# Patient Record
Sex: Male | Born: 1950 | Race: White | Hispanic: No | Smoking: Current every day smoker
Health system: Southern US, Community
[De-identification: ages and names within clinical notes are randomized; demographics above are authoritative.]

## PROBLEM LIST (undated history)

## (undated) DIAGNOSIS — F329 Major depressive disorder, single episode, unspecified: Secondary | ICD-10-CM

## (undated) DIAGNOSIS — F32A Depression, unspecified: Secondary | ICD-10-CM

## (undated) DIAGNOSIS — R590 Localized enlarged lymph nodes: Secondary | ICD-10-CM

## (undated) DIAGNOSIS — M1990 Unspecified osteoarthritis, unspecified site: Secondary | ICD-10-CM

## (undated) DIAGNOSIS — E785 Hyperlipidemia, unspecified: Secondary | ICD-10-CM

## (undated) DIAGNOSIS — I10 Essential (primary) hypertension: Secondary | ICD-10-CM

## (undated) DIAGNOSIS — J45909 Unspecified asthma, uncomplicated: Secondary | ICD-10-CM

## (undated) DIAGNOSIS — S82831A Other fracture of upper and lower end of right fibula, initial encounter for closed fracture: Secondary | ICD-10-CM

## (undated) DIAGNOSIS — D72820 Lymphocytosis (symptomatic): Secondary | ICD-10-CM

## (undated) DIAGNOSIS — E039 Hypothyroidism, unspecified: Secondary | ICD-10-CM

## (undated) HISTORY — DX: Hyperlipidemia, unspecified: E78.5

## (undated) HISTORY — DX: Essential (primary) hypertension: I10

## (undated) HISTORY — DX: Lymphocytosis (symptomatic): D72.820

## (undated) HISTORY — PX: OTHER SURGICAL HISTORY: SHX169

## (undated) HISTORY — DX: Localized enlarged lymph nodes: R59.0

## (undated) HISTORY — PX: TONSILLECTOMY: SUR1361

---

## 2003-07-03 HISTORY — PX: EYE SURGERY: SHX253

## 2007-07-18 ENCOUNTER — Emergency Department (HOSPITAL_COMMUNITY): Admission: EM | Admit: 2007-07-18 | Discharge: 2007-07-18 | Payer: Self-pay | Admitting: Family Medicine

## 2007-09-25 ENCOUNTER — Encounter: Admission: RE | Admit: 2007-09-25 | Discharge: 2007-09-25 | Payer: Self-pay | Admitting: Family Medicine

## 2008-03-19 ENCOUNTER — Encounter
Admission: RE | Admit: 2008-03-19 | Discharge: 2008-06-17 | Payer: Self-pay | Admitting: Physical Medicine & Rehabilitation

## 2008-03-21 ENCOUNTER — Emergency Department (HOSPITAL_COMMUNITY): Admission: EM | Admit: 2008-03-21 | Discharge: 2008-03-21 | Payer: Self-pay | Admitting: Family Medicine

## 2008-03-23 ENCOUNTER — Ambulatory Visit: Payer: Self-pay | Admitting: Physical Medicine & Rehabilitation

## 2008-03-27 ENCOUNTER — Emergency Department (HOSPITAL_COMMUNITY): Admission: EM | Admit: 2008-03-27 | Discharge: 2008-03-27 | Payer: Self-pay | Admitting: Emergency Medicine

## 2008-04-05 ENCOUNTER — Ambulatory Visit: Payer: Self-pay | Admitting: Physical Medicine & Rehabilitation

## 2008-04-30 ENCOUNTER — Emergency Department (HOSPITAL_COMMUNITY): Admission: EM | Admit: 2008-04-30 | Discharge: 2008-04-30 | Payer: Self-pay | Admitting: Emergency Medicine

## 2008-05-04 ENCOUNTER — Ambulatory Visit: Payer: Self-pay | Admitting: Physical Medicine & Rehabilitation

## 2008-05-17 ENCOUNTER — Ambulatory Visit: Payer: Self-pay | Admitting: Physical Medicine & Rehabilitation

## 2008-06-04 ENCOUNTER — Ambulatory Visit: Payer: Self-pay | Admitting: Physical Medicine & Rehabilitation

## 2008-06-15 ENCOUNTER — Encounter
Admission: RE | Admit: 2008-06-15 | Discharge: 2008-06-23 | Payer: Self-pay | Admitting: Physical Medicine & Rehabilitation

## 2008-07-05 ENCOUNTER — Encounter
Admission: RE | Admit: 2008-07-05 | Discharge: 2008-09-24 | Payer: Self-pay | Admitting: Physical Medicine & Rehabilitation

## 2008-07-06 ENCOUNTER — Ambulatory Visit: Payer: Self-pay | Admitting: Physical Medicine & Rehabilitation

## 2008-08-23 ENCOUNTER — Ambulatory Visit: Payer: Self-pay | Admitting: Physical Medicine & Rehabilitation

## 2008-09-24 ENCOUNTER — Encounter
Admission: RE | Admit: 2008-09-24 | Discharge: 2008-09-30 | Payer: Self-pay | Admitting: Physical Medicine & Rehabilitation

## 2008-09-30 ENCOUNTER — Ambulatory Visit: Payer: Self-pay | Admitting: Physical Medicine & Rehabilitation

## 2008-10-09 ENCOUNTER — Emergency Department (HOSPITAL_COMMUNITY): Admission: EM | Admit: 2008-10-09 | Discharge: 2008-10-10 | Payer: Self-pay | Admitting: Emergency Medicine

## 2008-10-17 ENCOUNTER — Emergency Department (HOSPITAL_COMMUNITY): Admission: EM | Admit: 2008-10-17 | Discharge: 2008-10-17 | Payer: Self-pay | Admitting: Emergency Medicine

## 2008-10-23 ENCOUNTER — Emergency Department (HOSPITAL_COMMUNITY): Admission: EM | Admit: 2008-10-23 | Discharge: 2008-10-23 | Payer: Self-pay | Admitting: Family Medicine

## 2008-11-01 ENCOUNTER — Encounter: Admission: RE | Admit: 2008-11-01 | Discharge: 2008-11-01 | Payer: Self-pay | Admitting: Specialist

## 2008-12-28 ENCOUNTER — Emergency Department (HOSPITAL_COMMUNITY): Admission: EM | Admit: 2008-12-28 | Discharge: 2008-12-28 | Payer: Self-pay | Admitting: Emergency Medicine

## 2009-01-13 ENCOUNTER — Emergency Department (HOSPITAL_COMMUNITY): Admission: EM | Admit: 2009-01-13 | Discharge: 2009-01-13 | Payer: Self-pay | Admitting: Chiropractic Medicine

## 2009-02-12 ENCOUNTER — Emergency Department (HOSPITAL_COMMUNITY): Admission: EM | Admit: 2009-02-12 | Discharge: 2009-02-13 | Payer: Self-pay | Admitting: Emergency Medicine

## 2009-03-29 ENCOUNTER — Emergency Department (HOSPITAL_COMMUNITY): Admission: EM | Admit: 2009-03-29 | Discharge: 2009-03-29 | Payer: Self-pay | Admitting: Emergency Medicine

## 2009-03-31 ENCOUNTER — Encounter: Admission: RE | Admit: 2009-03-31 | Discharge: 2009-03-31 | Payer: Self-pay | Admitting: Family Medicine

## 2009-04-11 ENCOUNTER — Emergency Department (HOSPITAL_COMMUNITY): Admission: EM | Admit: 2009-04-11 | Discharge: 2009-04-12 | Payer: Self-pay | Admitting: Emergency Medicine

## 2009-04-14 ENCOUNTER — Emergency Department (HOSPITAL_COMMUNITY): Admission: EM | Admit: 2009-04-14 | Discharge: 2009-04-14 | Payer: Self-pay | Admitting: Emergency Medicine

## 2009-04-28 ENCOUNTER — Encounter: Admission: RE | Admit: 2009-04-28 | Discharge: 2009-04-28 | Payer: Self-pay | Admitting: Specialist

## 2009-05-05 ENCOUNTER — Encounter: Admission: RE | Admit: 2009-05-05 | Discharge: 2009-05-05 | Payer: Self-pay | Admitting: Specialist

## 2009-05-28 ENCOUNTER — Emergency Department (HOSPITAL_COMMUNITY): Admission: EM | Admit: 2009-05-28 | Discharge: 2009-05-28 | Payer: Self-pay | Admitting: Emergency Medicine

## 2009-06-15 ENCOUNTER — Emergency Department (HOSPITAL_COMMUNITY): Admission: EM | Admit: 2009-06-15 | Discharge: 2009-06-15 | Payer: Self-pay | Admitting: Emergency Medicine

## 2009-06-15 ENCOUNTER — Ambulatory Visit (HOSPITAL_COMMUNITY): Admission: RE | Admit: 2009-06-15 | Discharge: 2009-06-15 | Payer: Self-pay | Admitting: Specialist

## 2009-06-17 ENCOUNTER — Encounter: Admission: RE | Admit: 2009-06-17 | Discharge: 2009-06-17 | Payer: Self-pay | Admitting: Family Medicine

## 2009-06-17 ENCOUNTER — Emergency Department (HOSPITAL_COMMUNITY): Admission: EM | Admit: 2009-06-17 | Discharge: 2009-06-17 | Payer: Self-pay | Admitting: Emergency Medicine

## 2009-06-18 ENCOUNTER — Emergency Department (HOSPITAL_COMMUNITY): Admission: EM | Admit: 2009-06-18 | Discharge: 2009-06-18 | Payer: Self-pay | Admitting: Emergency Medicine

## 2009-06-22 ENCOUNTER — Emergency Department (HOSPITAL_COMMUNITY): Admission: EM | Admit: 2009-06-22 | Discharge: 2009-06-22 | Payer: Self-pay | Admitting: Emergency Medicine

## 2009-06-23 ENCOUNTER — Emergency Department (HOSPITAL_COMMUNITY): Admission: EM | Admit: 2009-06-23 | Discharge: 2009-06-23 | Payer: Self-pay | Admitting: Emergency Medicine

## 2009-06-27 ENCOUNTER — Emergency Department (HOSPITAL_COMMUNITY): Admission: EM | Admit: 2009-06-27 | Discharge: 2009-06-28 | Payer: Self-pay | Admitting: Emergency Medicine

## 2009-08-15 ENCOUNTER — Emergency Department (HOSPITAL_COMMUNITY): Admission: EM | Admit: 2009-08-15 | Discharge: 2009-08-16 | Payer: Self-pay | Admitting: Emergency Medicine

## 2009-08-26 ENCOUNTER — Emergency Department (HOSPITAL_COMMUNITY): Admission: EM | Admit: 2009-08-26 | Discharge: 2009-08-26 | Payer: Self-pay | Admitting: Emergency Medicine

## 2009-08-29 ENCOUNTER — Emergency Department (HOSPITAL_COMMUNITY): Admission: EM | Admit: 2009-08-29 | Discharge: 2009-08-29 | Payer: Self-pay | Admitting: Internal Medicine

## 2009-08-30 HISTORY — PX: CARPAL TUNNEL RELEASE: SHX101

## 2009-09-18 ENCOUNTER — Emergency Department (HOSPITAL_COMMUNITY): Admission: EM | Admit: 2009-09-18 | Discharge: 2009-09-18 | Payer: Self-pay | Admitting: Emergency Medicine

## 2009-09-28 ENCOUNTER — Emergency Department (HOSPITAL_COMMUNITY): Admission: EM | Admit: 2009-09-28 | Discharge: 2009-09-28 | Payer: Self-pay | Admitting: Emergency Medicine

## 2009-09-29 ENCOUNTER — Emergency Department (HOSPITAL_COMMUNITY): Admission: EM | Admit: 2009-09-29 | Discharge: 2009-09-30 | Payer: Self-pay | Admitting: Emergency Medicine

## 2009-10-17 ENCOUNTER — Emergency Department (HOSPITAL_COMMUNITY): Admission: EM | Admit: 2009-10-17 | Discharge: 2009-10-18 | Payer: Self-pay | Admitting: Emergency Medicine

## 2010-04-02 ENCOUNTER — Emergency Department (HOSPITAL_COMMUNITY): Admission: EM | Admit: 2010-04-02 | Discharge: 2010-04-02 | Payer: Self-pay | Admitting: Emergency Medicine

## 2010-05-28 ENCOUNTER — Emergency Department (HOSPITAL_COMMUNITY): Admission: EM | Admit: 2010-05-28 | Discharge: 2010-05-28 | Payer: Self-pay | Admitting: Emergency Medicine

## 2010-06-07 ENCOUNTER — Encounter
Admission: RE | Admit: 2010-06-07 | Discharge: 2010-06-07 | Payer: Self-pay | Source: Home / Self Care | Admitting: Family Medicine

## 2010-06-08 ENCOUNTER — Emergency Department (HOSPITAL_COMMUNITY): Admission: EM | Admit: 2010-06-08 | Discharge: 2009-09-03 | Payer: Self-pay | Admitting: Emergency Medicine

## 2010-06-09 ENCOUNTER — Encounter
Admission: RE | Admit: 2010-06-09 | Discharge: 2010-06-09 | Payer: Self-pay | Source: Home / Self Care | Attending: Family Medicine | Admitting: Family Medicine

## 2010-06-10 ENCOUNTER — Emergency Department (HOSPITAL_COMMUNITY)
Admission: EM | Admit: 2010-06-10 | Discharge: 2010-06-10 | Payer: Self-pay | Source: Home / Self Care | Admitting: Emergency Medicine

## 2010-06-13 ENCOUNTER — Emergency Department (HOSPITAL_COMMUNITY)
Admission: EM | Admit: 2010-06-13 | Discharge: 2010-06-13 | Payer: Self-pay | Source: Home / Self Care | Admitting: Emergency Medicine

## 2010-06-23 ENCOUNTER — Ambulatory Visit: Payer: Self-pay | Admitting: Hematology & Oncology

## 2010-06-24 ENCOUNTER — Emergency Department (HOSPITAL_COMMUNITY)
Admission: EM | Admit: 2010-06-24 | Discharge: 2010-06-24 | Payer: Self-pay | Source: Home / Self Care | Admitting: Emergency Medicine

## 2010-07-13 LAB — CBC WITH DIFFERENTIAL (CANCER CENTER ONLY)
BASO#: 0.2 10*3/uL (ref 0.0–0.2)
BASO%: 0.9 % (ref 0.0–2.0)
EOS%: 2.3 % (ref 0.0–7.0)
Eosinophils Absolute: 0.4 10*3/uL (ref 0.0–0.5)
HCT: 43 % (ref 38.7–49.9)
HGB: 14.6 g/dL (ref 13.0–17.1)
LYMPH#: 2 10*3/uL (ref 0.9–3.3)
LYMPH%: 11.9 % — ABNORMAL LOW (ref 14.0–48.0)
MCH: 30.3 pg (ref 28.0–33.4)
MCHC: 34 g/dL (ref 32.0–35.9)
MCV: 89 fL (ref 82–98)
MONO#: 1.1 10*3/uL — ABNORMAL HIGH (ref 0.1–0.9)
MONO%: 6.2 % (ref 0.0–13.0)
NEUT#: 13.3 10*3/uL — ABNORMAL HIGH (ref 1.5–6.5)
NEUT%: 78.7 % (ref 40.0–80.0)
Platelets: 277 10*3/uL (ref 145–400)
RBC: 4.82 10*6/uL (ref 4.20–5.70)
RDW: 11.5 % (ref 10.5–14.6)
WBC: 16.9 10*3/uL — ABNORMAL HIGH (ref 4.0–10.0)

## 2010-07-13 LAB — TECHNOLOGIST REVIEW CHCC SATELLITE

## 2010-07-13 LAB — CHCC SATELLITE - SMEAR

## 2010-07-14 LAB — COMPREHENSIVE METABOLIC PANEL
ALT: 13 U/L (ref 0–53)
AST: 16 U/L (ref 0–37)
Albumin: 3.9 g/dL (ref 3.5–5.2)
Alkaline Phosphatase: 71 U/L (ref 39–117)
BUN: 13 mg/dL (ref 6–23)
CO2: 23 mEq/L (ref 19–32)
Calcium: 9 mg/dL (ref 8.4–10.5)
Chloride: 99 mEq/L (ref 96–112)
Creatinine, Ser: 0.9 mg/dL (ref 0.40–1.50)
Glucose, Bld: 260 mg/dL — ABNORMAL HIGH (ref 70–99)
Potassium: 3.6 mEq/L (ref 3.5–5.3)
Sodium: 136 mEq/L (ref 135–145)
Total Bilirubin: 0.8 mg/dL (ref 0.3–1.2)
Total Protein: 6.7 g/dL (ref 6.0–8.3)

## 2010-07-14 LAB — ANGIOTENSIN CONVERTING ENZYME: Angiotensin 1 CE: 43 U/L (ref 8–52)

## 2010-07-14 LAB — LACTATE DEHYDROGENASE: LDH: 387 U/L — ABNORMAL HIGH (ref 94–250)

## 2010-07-16 ENCOUNTER — Emergency Department (HOSPITAL_COMMUNITY)
Admission: EM | Admit: 2010-07-16 | Discharge: 2010-07-17 | Payer: Self-pay | Source: Home / Self Care | Admitting: Emergency Medicine

## 2010-07-17 LAB — RAPID STREP SCREEN (MED CTR MEBANE ONLY): Streptococcus, Group A Screen (Direct): NEGATIVE

## 2010-07-19 LAB — STREP A DNA PROBE: Group A Strep Probe: NEGATIVE

## 2010-07-20 ENCOUNTER — Other Ambulatory Visit: Payer: Self-pay | Admitting: Hematology & Oncology

## 2010-07-20 DIAGNOSIS — R591 Generalized enlarged lymph nodes: Secondary | ICD-10-CM

## 2010-07-21 ENCOUNTER — Emergency Department (HOSPITAL_COMMUNITY)
Admission: EM | Admit: 2010-07-21 | Discharge: 2010-07-21 | Payer: Self-pay | Source: Home / Self Care | Admitting: Emergency Medicine

## 2010-07-23 ENCOUNTER — Encounter: Payer: Self-pay | Admitting: Family Medicine

## 2010-07-23 ENCOUNTER — Encounter: Payer: Self-pay | Admitting: Specialist

## 2010-07-26 LAB — CULTURE, ROUTINE-ABSCESS

## 2010-08-01 ENCOUNTER — Emergency Department (HOSPITAL_COMMUNITY)
Admission: EM | Admit: 2010-08-01 | Discharge: 2010-08-01 | Payer: Self-pay | Source: Home / Self Care | Admitting: Emergency Medicine

## 2010-08-20 ENCOUNTER — Emergency Department (HOSPITAL_COMMUNITY)
Admission: EM | Admit: 2010-08-20 | Discharge: 2010-08-20 | Disposition: A | Discharge status: Home / Self Care | Payer: Medicare Other | Attending: Emergency Medicine | Admitting: Emergency Medicine

## 2010-08-20 DIAGNOSIS — G8929 Other chronic pain: Secondary | ICD-10-CM | POA: Insufficient documentation

## 2010-08-20 DIAGNOSIS — IMO0001 Reserved for inherently not codable concepts without codable children: Secondary | ICD-10-CM | POA: Insufficient documentation

## 2010-08-20 DIAGNOSIS — M549 Dorsalgia, unspecified: Secondary | ICD-10-CM | POA: Insufficient documentation

## 2010-08-20 DIAGNOSIS — Z794 Long term (current) use of insulin: Secondary | ICD-10-CM | POA: Insufficient documentation

## 2010-08-20 DIAGNOSIS — L03317 Cellulitis of buttock: Secondary | ICD-10-CM | POA: Insufficient documentation

## 2010-08-20 DIAGNOSIS — R000 Tachycardia, unspecified: Secondary | ICD-10-CM | POA: Insufficient documentation

## 2010-08-20 DIAGNOSIS — L0231 Cutaneous abscess of buttock: Secondary | ICD-10-CM | POA: Insufficient documentation

## 2010-08-20 DIAGNOSIS — E785 Hyperlipidemia, unspecified: Secondary | ICD-10-CM | POA: Insufficient documentation

## 2010-08-20 DIAGNOSIS — Z79899 Other long term (current) drug therapy: Secondary | ICD-10-CM | POA: Insufficient documentation

## 2010-08-20 DIAGNOSIS — I10 Essential (primary) hypertension: Secondary | ICD-10-CM | POA: Insufficient documentation

## 2010-08-20 DIAGNOSIS — E119 Type 2 diabetes mellitus without complications: Secondary | ICD-10-CM | POA: Insufficient documentation

## 2010-08-20 DIAGNOSIS — M25559 Pain in unspecified hip: Secondary | ICD-10-CM | POA: Insufficient documentation

## 2010-08-24 ENCOUNTER — Ambulatory Visit (HOSPITAL_BASED_OUTPATIENT_CLINIC_OR_DEPARTMENT_OTHER)
Admission: RE | Admit: 2010-08-24 | Discharge: 2010-08-24 | Disposition: A | Discharge status: Home / Self Care | Payer: Medicare Other | Source: Ambulatory Visit | Attending: Hematology & Oncology | Admitting: Hematology & Oncology

## 2010-08-24 ENCOUNTER — Emergency Department (HOSPITAL_BASED_OUTPATIENT_CLINIC_OR_DEPARTMENT_OTHER)
Admission: EM | Admit: 2010-08-24 | Discharge: 2010-08-24 | Disposition: A | Discharge status: Home / Self Care | Payer: Medicare Other | Source: Home / Self Care | Attending: Emergency Medicine | Admitting: Emergency Medicine

## 2010-08-24 DIAGNOSIS — E785 Hyperlipidemia, unspecified: Secondary | ICD-10-CM | POA: Insufficient documentation

## 2010-08-24 DIAGNOSIS — L03119 Cellulitis of unspecified part of limb: Secondary | ICD-10-CM | POA: Insufficient documentation

## 2010-08-24 DIAGNOSIS — Z794 Long term (current) use of insulin: Secondary | ICD-10-CM | POA: Insufficient documentation

## 2010-08-24 DIAGNOSIS — I10 Essential (primary) hypertension: Secondary | ICD-10-CM | POA: Insufficient documentation

## 2010-08-24 DIAGNOSIS — L02419 Cutaneous abscess of limb, unspecified: Secondary | ICD-10-CM | POA: Insufficient documentation

## 2010-08-24 DIAGNOSIS — B356 Tinea cruris: Secondary | ICD-10-CM | POA: Insufficient documentation

## 2010-08-24 DIAGNOSIS — F17200 Nicotine dependence, unspecified, uncomplicated: Secondary | ICD-10-CM

## 2010-08-24 DIAGNOSIS — R229 Localized swelling, mass and lump, unspecified: Secondary | ICD-10-CM | POA: Insufficient documentation

## 2010-08-24 DIAGNOSIS — R599 Enlarged lymph nodes, unspecified: Secondary | ICD-10-CM | POA: Insufficient documentation

## 2010-08-24 DIAGNOSIS — R591 Generalized enlarged lymph nodes: Secondary | ICD-10-CM

## 2010-08-24 DIAGNOSIS — R21 Rash and other nonspecific skin eruption: Secondary | ICD-10-CM | POA: Insufficient documentation

## 2010-08-24 DIAGNOSIS — E119 Type 2 diabetes mellitus without complications: Secondary | ICD-10-CM | POA: Insufficient documentation

## 2010-08-24 MED ORDER — IOHEXOL 300 MG/ML  SOLN
100.0000 mL | Freq: Once | INTRAMUSCULAR | Status: AC | PRN
  Administered 2010-08-24: 80 mL via INTRAVENOUS

## 2010-08-25 ENCOUNTER — Emergency Department (HOSPITAL_COMMUNITY)
Admission: EM | Admit: 2010-08-25 | Discharge: 2010-08-25 | Disposition: A | Discharge status: Home / Self Care | Payer: Medicare Other | Attending: Emergency Medicine | Admitting: Emergency Medicine

## 2010-08-25 DIAGNOSIS — I10 Essential (primary) hypertension: Secondary | ICD-10-CM | POA: Insufficient documentation

## 2010-08-25 DIAGNOSIS — E119 Type 2 diabetes mellitus without complications: Secondary | ICD-10-CM | POA: Insufficient documentation

## 2010-08-25 DIAGNOSIS — L02419 Cutaneous abscess of limb, unspecified: Secondary | ICD-10-CM | POA: Insufficient documentation

## 2010-08-25 DIAGNOSIS — F411 Generalized anxiety disorder: Secondary | ICD-10-CM | POA: Insufficient documentation

## 2010-08-25 DIAGNOSIS — M25559 Pain in unspecified hip: Secondary | ICD-10-CM | POA: Insufficient documentation

## 2010-08-25 DIAGNOSIS — E785 Hyperlipidemia, unspecified: Secondary | ICD-10-CM | POA: Insufficient documentation

## 2010-08-25 DIAGNOSIS — L03317 Cellulitis of buttock: Secondary | ICD-10-CM | POA: Insufficient documentation

## 2010-08-25 DIAGNOSIS — L03119 Cellulitis of unspecified part of limb: Secondary | ICD-10-CM | POA: Insufficient documentation

## 2010-08-25 DIAGNOSIS — L0231 Cutaneous abscess of buttock: Secondary | ICD-10-CM | POA: Insufficient documentation

## 2010-09-04 ENCOUNTER — Encounter (HOSPITAL_BASED_OUTPATIENT_CLINIC_OR_DEPARTMENT_OTHER): Payer: Medicare Other | Admitting: Hematology & Oncology

## 2010-09-04 ENCOUNTER — Other Ambulatory Visit: Payer: Self-pay | Admitting: Hematology & Oncology

## 2010-09-04 DIAGNOSIS — C383 Malignant neoplasm of mediastinum, part unspecified: Secondary | ICD-10-CM

## 2010-09-04 DIAGNOSIS — R05 Cough: Secondary | ICD-10-CM

## 2010-09-04 DIAGNOSIS — D72829 Elevated white blood cell count, unspecified: Secondary | ICD-10-CM

## 2010-09-04 DIAGNOSIS — F17200 Nicotine dependence, unspecified, uncomplicated: Secondary | ICD-10-CM

## 2010-09-04 DIAGNOSIS — D72820 Lymphocytosis (symptomatic): Secondary | ICD-10-CM

## 2010-09-04 DIAGNOSIS — R599 Enlarged lymph nodes, unspecified: Secondary | ICD-10-CM

## 2010-09-04 LAB — CBC WITH DIFFERENTIAL (CANCER CENTER ONLY)
Eosinophils Absolute: 0.4 10*3/uL (ref 0.0–0.5)
HCT: 42.9 % (ref 38.7–49.9)
LYMPH%: 19.5 % (ref 14.0–48.0)
MCV: 85 fL (ref 82–98)
MONO#: 0.8 10*3/uL (ref 0.1–0.9)
NEUT%: 70.1 % (ref 40.0–80.0)
RBC: 5.07 10*6/uL (ref 4.20–5.70)
WBC: 12.4 10*3/uL — ABNORMAL HIGH (ref 4.0–10.0)

## 2010-09-09 ENCOUNTER — Emergency Department (HOSPITAL_COMMUNITY)
Admission: EM | Admit: 2010-09-09 | Discharge: 2010-09-09 | Disposition: A | Discharge status: Home / Self Care | Payer: Medicare Other | Attending: Emergency Medicine | Admitting: Emergency Medicine

## 2010-09-09 DIAGNOSIS — Z794 Long term (current) use of insulin: Secondary | ICD-10-CM | POA: Insufficient documentation

## 2010-09-09 DIAGNOSIS — L0231 Cutaneous abscess of buttock: Secondary | ICD-10-CM | POA: Insufficient documentation

## 2010-09-09 DIAGNOSIS — E785 Hyperlipidemia, unspecified: Secondary | ICD-10-CM | POA: Insufficient documentation

## 2010-09-09 DIAGNOSIS — E119 Type 2 diabetes mellitus without complications: Secondary | ICD-10-CM | POA: Insufficient documentation

## 2010-09-09 DIAGNOSIS — I10 Essential (primary) hypertension: Secondary | ICD-10-CM | POA: Insufficient documentation

## 2010-09-11 ENCOUNTER — Emergency Department (HOSPITAL_COMMUNITY)
Admission: EM | Admit: 2010-09-11 | Discharge: 2010-09-11 | Disposition: A | Discharge status: Home / Self Care | Payer: Medicare Other | Attending: Emergency Medicine | Admitting: Emergency Medicine

## 2010-09-11 ENCOUNTER — Emergency Department (HOSPITAL_COMMUNITY): Payer: Medicare Other

## 2010-09-11 DIAGNOSIS — S8990XA Unspecified injury of unspecified lower leg, initial encounter: Secondary | ICD-10-CM | POA: Insufficient documentation

## 2010-09-11 DIAGNOSIS — M549 Dorsalgia, unspecified: Secondary | ICD-10-CM | POA: Insufficient documentation

## 2010-09-11 DIAGNOSIS — M545 Low back pain, unspecified: Secondary | ICD-10-CM | POA: Insufficient documentation

## 2010-09-11 DIAGNOSIS — E119 Type 2 diabetes mellitus without complications: Secondary | ICD-10-CM | POA: Insufficient documentation

## 2010-09-11 DIAGNOSIS — I10 Essential (primary) hypertension: Secondary | ICD-10-CM | POA: Insufficient documentation

## 2010-09-11 DIAGNOSIS — Z794 Long term (current) use of insulin: Secondary | ICD-10-CM | POA: Insufficient documentation

## 2010-09-11 DIAGNOSIS — Y92009 Unspecified place in unspecified non-institutional (private) residence as the place of occurrence of the external cause: Secondary | ICD-10-CM | POA: Insufficient documentation

## 2010-09-11 DIAGNOSIS — M25539 Pain in unspecified wrist: Secondary | ICD-10-CM | POA: Insufficient documentation

## 2010-09-11 DIAGNOSIS — M533 Sacrococcygeal disorders, not elsewhere classified: Secondary | ICD-10-CM | POA: Insufficient documentation

## 2010-09-11 DIAGNOSIS — S63509A Unspecified sprain of unspecified wrist, initial encounter: Secondary | ICD-10-CM | POA: Insufficient documentation

## 2010-09-11 DIAGNOSIS — E785 Hyperlipidemia, unspecified: Secondary | ICD-10-CM | POA: Insufficient documentation

## 2010-09-11 DIAGNOSIS — Z79899 Other long term (current) drug therapy: Secondary | ICD-10-CM | POA: Insufficient documentation

## 2010-09-11 DIAGNOSIS — W19XXXA Unspecified fall, initial encounter: Secondary | ICD-10-CM | POA: Insufficient documentation

## 2010-09-14 LAB — DIFFERENTIAL
Basophils Absolute: 0.1 10*3/uL (ref 0.0–0.1)
Basophils Relative: 1 % (ref 0–1)
Neutro Abs: 10 10*3/uL — ABNORMAL HIGH (ref 1.7–7.7)
Neutrophils Relative %: 73 % (ref 43–77)

## 2010-09-14 LAB — CBC
Hemoglobin: 13.9 g/dL (ref 13.0–17.0)
MCHC: 33.7 g/dL (ref 30.0–36.0)
Platelets: 205 10*3/uL (ref 150–400)
RDW: 13.7 % (ref 11.5–15.5)

## 2010-09-14 LAB — POCT I-STAT, CHEM 8
Chloride: 106 mEq/L (ref 96–112)
HCT: 41 % (ref 39.0–52.0)
Hemoglobin: 13.9 g/dL (ref 13.0–17.0)
Potassium: 3.7 mEq/L (ref 3.5–5.1)
Sodium: 140 mEq/L (ref 135–145)

## 2010-09-14 LAB — GLUCOSE, CAPILLARY: Glucose-Capillary: 252 mg/dL — ABNORMAL HIGH (ref 70–99)

## 2010-09-14 LAB — POCT CARDIAC MARKERS
CKMB, poc: 2.8 ng/mL (ref 1.0–8.0)
Troponin i, poc: <0.05 ng/mL (ref 0.00–0.09)

## 2010-09-16 ENCOUNTER — Emergency Department (HOSPITAL_COMMUNITY): Payer: Medicare Other

## 2010-09-16 ENCOUNTER — Emergency Department (HOSPITAL_COMMUNITY)
Admission: EM | Admit: 2010-09-16 | Discharge: 2010-09-16 | Disposition: A | Discharge status: Home / Self Care | Payer: Medicare Other | Attending: Emergency Medicine | Admitting: Emergency Medicine

## 2010-09-16 DIAGNOSIS — I10 Essential (primary) hypertension: Secondary | ICD-10-CM | POA: Insufficient documentation

## 2010-09-16 DIAGNOSIS — Z794 Long term (current) use of insulin: Secondary | ICD-10-CM | POA: Insufficient documentation

## 2010-09-16 DIAGNOSIS — IMO0002 Reserved for concepts with insufficient information to code with codable children: Secondary | ICD-10-CM | POA: Insufficient documentation

## 2010-09-16 DIAGNOSIS — G8929 Other chronic pain: Secondary | ICD-10-CM | POA: Insufficient documentation

## 2010-09-16 DIAGNOSIS — R630 Anorexia: Secondary | ICD-10-CM | POA: Insufficient documentation

## 2010-09-16 DIAGNOSIS — F410 Panic disorder [episodic paroxysmal anxiety] without agoraphobia: Secondary | ICD-10-CM | POA: Insufficient documentation

## 2010-09-16 DIAGNOSIS — R634 Abnormal weight loss: Secondary | ICD-10-CM | POA: Insufficient documentation

## 2010-09-16 DIAGNOSIS — M549 Dorsalgia, unspecified: Secondary | ICD-10-CM | POA: Insufficient documentation

## 2010-09-16 DIAGNOSIS — E785 Hyperlipidemia, unspecified: Secondary | ICD-10-CM | POA: Insufficient documentation

## 2010-09-16 DIAGNOSIS — E119 Type 2 diabetes mellitus without complications: Secondary | ICD-10-CM | POA: Insufficient documentation

## 2010-09-16 DIAGNOSIS — Z79899 Other long term (current) drug therapy: Secondary | ICD-10-CM | POA: Insufficient documentation

## 2010-09-16 DIAGNOSIS — R51 Headache: Secondary | ICD-10-CM | POA: Insufficient documentation

## 2010-09-16 DIAGNOSIS — W19XXXA Unspecified fall, initial encounter: Secondary | ICD-10-CM | POA: Insufficient documentation

## 2010-09-16 DIAGNOSIS — F411 Generalized anxiety disorder: Secondary | ICD-10-CM | POA: Insufficient documentation

## 2010-09-16 DIAGNOSIS — R079 Chest pain, unspecified: Secondary | ICD-10-CM | POA: Insufficient documentation

## 2010-09-16 LAB — DIFFERENTIAL
Basophils Absolute: 0 10*3/uL (ref 0.0–0.1)
Basophils Relative: 0 % (ref 0–1)
Myelocytes: 0 %
Neutro Abs: 11.4 10*3/uL — ABNORMAL HIGH (ref 1.7–7.7)
Neutrophils Relative %: 73 % (ref 43–77)
Promyelocytes Absolute: 0 %

## 2010-09-16 LAB — CBC
Hemoglobin: 15.9 g/dL (ref 13.0–17.0)
MCH: 30.3 pg (ref 26.0–34.0)
RBC: 5.25 MIL/uL (ref 4.22–5.81)

## 2010-09-16 LAB — POCT CARDIAC MARKERS
CKMB, poc: 2.1 ng/mL (ref 1.0–8.0)
Myoglobin, poc: 112 ng/mL (ref 12–200)
Troponin i, poc: <0.05 ng/mL (ref 0.00–0.09)

## 2010-09-16 LAB — POCT I-STAT, CHEM 8
BUN: 12 mg/dL (ref 6–23)
Calcium, Ion: 1.13 mmol/L (ref 1.12–1.32)
Hemoglobin: 17.3 g/dL — ABNORMAL HIGH (ref 13.0–17.0)
TCO2: 28 mmol/L (ref 0–100)

## 2010-09-20 ENCOUNTER — Emergency Department (HOSPITAL_COMMUNITY)
Admission: EM | Admit: 2010-09-20 | Discharge: 2010-09-20 | Disposition: A | Discharge status: Home / Self Care | Payer: Medicare Other | Attending: Emergency Medicine | Admitting: Emergency Medicine

## 2010-09-20 ENCOUNTER — Emergency Department (HOSPITAL_COMMUNITY)
Admission: EM | Admit: 2010-09-20 | Discharge: 2010-09-20 | Discharge status: Against Medical Advice | Payer: Medicare Other | Attending: Emergency Medicine | Admitting: Emergency Medicine

## 2010-09-20 ENCOUNTER — Encounter (HOSPITAL_COMMUNITY)
Admission: RE | Admit: 2010-09-20 | Discharge: 2010-09-20 | Disposition: A | Discharge status: Home / Self Care | Payer: Medicare Other | Source: Ambulatory Visit | Attending: Hematology & Oncology | Admitting: Hematology & Oncology

## 2010-09-20 DIAGNOSIS — C383 Malignant neoplasm of mediastinum, part unspecified: Secondary | ICD-10-CM

## 2010-09-20 DIAGNOSIS — I10 Essential (primary) hypertension: Secondary | ICD-10-CM | POA: Insufficient documentation

## 2010-09-20 DIAGNOSIS — L03119 Cellulitis of unspecified part of limb: Secondary | ICD-10-CM | POA: Insufficient documentation

## 2010-09-20 DIAGNOSIS — L03319 Cellulitis of trunk, unspecified: Secondary | ICD-10-CM | POA: Insufficient documentation

## 2010-09-20 DIAGNOSIS — Z794 Long term (current) use of insulin: Secondary | ICD-10-CM | POA: Insufficient documentation

## 2010-09-20 DIAGNOSIS — E119 Type 2 diabetes mellitus without complications: Secondary | ICD-10-CM | POA: Insufficient documentation

## 2010-09-20 DIAGNOSIS — E785 Hyperlipidemia, unspecified: Secondary | ICD-10-CM | POA: Insufficient documentation

## 2010-09-20 DIAGNOSIS — L02419 Cutaneous abscess of limb, unspecified: Secondary | ICD-10-CM | POA: Insufficient documentation

## 2010-09-20 DIAGNOSIS — L02219 Cutaneous abscess of trunk, unspecified: Secondary | ICD-10-CM | POA: Insufficient documentation

## 2010-09-20 LAB — GLUCOSE, CAPILLARY: Glucose-Capillary: 190 mg/dL — ABNORMAL HIGH (ref 70–99)

## 2010-09-22 ENCOUNTER — Emergency Department (HOSPITAL_COMMUNITY)
Admission: EM | Admit: 2010-09-22 | Discharge: 2010-09-22 | Disposition: A | Discharge status: Home / Self Care | Payer: Medicare Other | Attending: Emergency Medicine | Admitting: Emergency Medicine

## 2010-09-22 DIAGNOSIS — Z794 Long term (current) use of insulin: Secondary | ICD-10-CM | POA: Insufficient documentation

## 2010-09-22 DIAGNOSIS — L03319 Cellulitis of trunk, unspecified: Secondary | ICD-10-CM | POA: Insufficient documentation

## 2010-09-22 DIAGNOSIS — G8929 Other chronic pain: Secondary | ICD-10-CM | POA: Insufficient documentation

## 2010-09-22 DIAGNOSIS — E785 Hyperlipidemia, unspecified: Secondary | ICD-10-CM | POA: Insufficient documentation

## 2010-09-22 DIAGNOSIS — I10 Essential (primary) hypertension: Secondary | ICD-10-CM | POA: Insufficient documentation

## 2010-09-22 DIAGNOSIS — M549 Dorsalgia, unspecified: Secondary | ICD-10-CM | POA: Insufficient documentation

## 2010-09-22 DIAGNOSIS — E119 Type 2 diabetes mellitus without complications: Secondary | ICD-10-CM | POA: Insufficient documentation

## 2010-09-22 DIAGNOSIS — Z79899 Other long term (current) drug therapy: Secondary | ICD-10-CM | POA: Insufficient documentation

## 2010-09-22 DIAGNOSIS — L02219 Cutaneous abscess of trunk, unspecified: Secondary | ICD-10-CM | POA: Insufficient documentation

## 2010-09-23 ENCOUNTER — Emergency Department (HOSPITAL_COMMUNITY): Payer: Medicare Other

## 2010-09-23 ENCOUNTER — Emergency Department (HOSPITAL_COMMUNITY)
Admission: EM | Admit: 2010-09-23 | Discharge: 2010-09-23 | Disposition: A | Discharge status: Home / Self Care | Payer: Medicare Other | Attending: Emergency Medicine | Admitting: Emergency Medicine

## 2010-09-23 DIAGNOSIS — R269 Unspecified abnormalities of gait and mobility: Secondary | ICD-10-CM | POA: Insufficient documentation

## 2010-09-23 DIAGNOSIS — Z794 Long term (current) use of insulin: Secondary | ICD-10-CM | POA: Insufficient documentation

## 2010-09-23 DIAGNOSIS — I10 Essential (primary) hypertension: Secondary | ICD-10-CM | POA: Insufficient documentation

## 2010-09-23 DIAGNOSIS — M2390 Unspecified internal derangement of unspecified knee: Secondary | ICD-10-CM | POA: Insufficient documentation

## 2010-09-23 DIAGNOSIS — X500XXA Overexertion from strenuous movement or load, initial encounter: Secondary | ICD-10-CM | POA: Insufficient documentation

## 2010-09-23 DIAGNOSIS — M2540 Effusion, unspecified joint: Secondary | ICD-10-CM | POA: Insufficient documentation

## 2010-09-23 DIAGNOSIS — E119 Type 2 diabetes mellitus without complications: Secondary | ICD-10-CM | POA: Insufficient documentation

## 2010-09-25 LAB — WOUND CULTURE

## 2010-09-26 ENCOUNTER — Encounter (HOSPITAL_COMMUNITY)
Admission: RE | Admit: 2010-09-26 | Discharge: 2010-09-26 | Disposition: A | Discharge status: Home / Self Care | Payer: Medicare Other | Source: Ambulatory Visit | Attending: Hematology & Oncology | Admitting: Hematology & Oncology

## 2010-09-26 DIAGNOSIS — C383 Malignant neoplasm of mediastinum, part unspecified: Secondary | ICD-10-CM | POA: Insufficient documentation

## 2010-09-27 ENCOUNTER — Ambulatory Visit
Admission: RE | Admit: 2010-09-27 | Discharge: 2010-09-27 | Disposition: A | Discharge status: Home / Self Care | Payer: Medicare Other | Source: Ambulatory Visit | Attending: Orthopedic Surgery | Admitting: Orthopedic Surgery

## 2010-09-27 ENCOUNTER — Other Ambulatory Visit: Payer: Self-pay | Admitting: Orthopedic Surgery

## 2010-09-27 DIAGNOSIS — M25561 Pain in right knee: Secondary | ICD-10-CM

## 2010-09-29 ENCOUNTER — Other Ambulatory Visit (HOSPITAL_COMMUNITY): Payer: Medicare Other

## 2010-10-01 ENCOUNTER — Emergency Department (HOSPITAL_COMMUNITY)
Admission: EM | Admit: 2010-10-01 | Discharge: 2010-10-02 | Disposition: A | Discharge status: Home / Self Care | Payer: Medicare Other | Attending: Emergency Medicine | Admitting: Emergency Medicine

## 2010-10-01 DIAGNOSIS — E119 Type 2 diabetes mellitus without complications: Secondary | ICD-10-CM | POA: Insufficient documentation

## 2010-10-01 DIAGNOSIS — Z79899 Other long term (current) drug therapy: Secondary | ICD-10-CM | POA: Insufficient documentation

## 2010-10-01 DIAGNOSIS — Z794 Long term (current) use of insulin: Secondary | ICD-10-CM | POA: Insufficient documentation

## 2010-10-01 DIAGNOSIS — E785 Hyperlipidemia, unspecified: Secondary | ICD-10-CM | POA: Insufficient documentation

## 2010-10-01 DIAGNOSIS — L02419 Cutaneous abscess of limb, unspecified: Secondary | ICD-10-CM | POA: Insufficient documentation

## 2010-10-01 DIAGNOSIS — M79609 Pain in unspecified limb: Secondary | ICD-10-CM | POA: Insufficient documentation

## 2010-10-01 DIAGNOSIS — L708 Other acne: Secondary | ICD-10-CM | POA: Insufficient documentation

## 2010-10-01 DIAGNOSIS — G8929 Other chronic pain: Secondary | ICD-10-CM | POA: Insufficient documentation

## 2010-10-01 DIAGNOSIS — M549 Dorsalgia, unspecified: Secondary | ICD-10-CM | POA: Insufficient documentation

## 2010-10-01 DIAGNOSIS — F411 Generalized anxiety disorder: Secondary | ICD-10-CM | POA: Insufficient documentation

## 2010-10-01 DIAGNOSIS — I10 Essential (primary) hypertension: Secondary | ICD-10-CM | POA: Insufficient documentation

## 2010-10-02 ENCOUNTER — Encounter (HOSPITAL_COMMUNITY): Admission: RE | Admit: 2010-10-02 | Payer: Medicare Other | Source: Ambulatory Visit

## 2010-10-03 LAB — DIFFERENTIAL
Basophils Absolute: 0.1 10*3/uL (ref 0.0–0.1)
Lymphocytes Relative: 13 % (ref 12–46)
Monocytes Absolute: 1 10*3/uL (ref 0.1–1.0)
Neutro Abs: 13 10*3/uL — ABNORMAL HIGH (ref 1.7–7.7)
Neutrophils Relative %: 79 % — ABNORMAL HIGH (ref 43–77)

## 2010-10-03 LAB — PROTIME-INR
INR: 0.98 (ref 0.00–1.49)
Prothrombin Time: 12.9 seconds (ref 11.6–15.2)

## 2010-10-03 LAB — CBC
HCT: 41.9 % (ref 39.0–52.0)
Hemoglobin: 14.7 g/dL (ref 13.0–17.0)
MCV: 88.3 fL (ref 78.0–100.0)
Platelets: 201 10*3/uL (ref 150–400)
RDW: 15.4 % (ref 11.5–15.5)
WBC: 16.5 10*3/uL — ABNORMAL HIGH (ref 4.0–10.5)

## 2010-10-03 LAB — COMPREHENSIVE METABOLIC PANEL
Albumin: 3.6 g/dL (ref 3.5–5.2)
BUN: 13 mg/dL (ref 6–23)
Chloride: 105 mEq/L (ref 96–112)
Creatinine, Ser: 0.9 mg/dL (ref 0.4–1.5)
Glucose, Bld: 136 mg/dL — ABNORMAL HIGH (ref 70–99)
Total Bilirubin: 0.6 mg/dL (ref 0.3–1.2)

## 2010-10-05 ENCOUNTER — Encounter (HOSPITAL_COMMUNITY): Admission: RE | Admit: 2010-10-05 | Payer: Medicare Other | Source: Ambulatory Visit

## 2010-10-05 DIAGNOSIS — E669 Obesity, unspecified: Secondary | ICD-10-CM | POA: Insufficient documentation

## 2010-10-05 DIAGNOSIS — R059 Cough, unspecified: Secondary | ICD-10-CM | POA: Insufficient documentation

## 2010-10-05 DIAGNOSIS — E1165 Type 2 diabetes mellitus with hyperglycemia: Secondary | ICD-10-CM

## 2010-10-05 DIAGNOSIS — M25569 Pain in unspecified knee: Secondary | ICD-10-CM | POA: Insufficient documentation

## 2010-10-05 DIAGNOSIS — L723 Sebaceous cyst: Secondary | ICD-10-CM | POA: Insufficient documentation

## 2010-10-05 DIAGNOSIS — R599 Enlarged lymph nodes, unspecified: Secondary | ICD-10-CM | POA: Insufficient documentation

## 2010-10-05 DIAGNOSIS — R05 Cough: Secondary | ICD-10-CM | POA: Insufficient documentation

## 2010-10-05 DIAGNOSIS — D72820 Lymphocytosis (symptomatic): Secondary | ICD-10-CM | POA: Insufficient documentation

## 2010-10-05 DIAGNOSIS — IMO0001 Reserved for inherently not codable concepts without codable children: Secondary | ICD-10-CM | POA: Insufficient documentation

## 2010-10-05 LAB — GLUCOSE, CAPILLARY: Glucose-Capillary: 507 mg/dL — ABNORMAL HIGH (ref 70–99)

## 2010-10-07 ENCOUNTER — Emergency Department (HOSPITAL_COMMUNITY)
Admission: EM | Admit: 2010-10-07 | Discharge: 2010-10-07 | Disposition: A | Discharge status: Home / Self Care | Payer: Medicare Other | Attending: Emergency Medicine | Admitting: Emergency Medicine

## 2010-10-07 DIAGNOSIS — L0231 Cutaneous abscess of buttock: Secondary | ICD-10-CM | POA: Insufficient documentation

## 2010-10-07 DIAGNOSIS — E785 Hyperlipidemia, unspecified: Secondary | ICD-10-CM | POA: Insufficient documentation

## 2010-10-07 DIAGNOSIS — Z794 Long term (current) use of insulin: Secondary | ICD-10-CM | POA: Insufficient documentation

## 2010-10-07 DIAGNOSIS — I10 Essential (primary) hypertension: Secondary | ICD-10-CM | POA: Insufficient documentation

## 2010-10-07 DIAGNOSIS — G8929 Other chronic pain: Secondary | ICD-10-CM | POA: Insufficient documentation

## 2010-10-07 DIAGNOSIS — E119 Type 2 diabetes mellitus without complications: Secondary | ICD-10-CM | POA: Insufficient documentation

## 2010-10-07 DIAGNOSIS — F411 Generalized anxiety disorder: Secondary | ICD-10-CM | POA: Insufficient documentation

## 2010-10-07 DIAGNOSIS — Z79899 Other long term (current) drug therapy: Secondary | ICD-10-CM | POA: Insufficient documentation

## 2010-10-07 DIAGNOSIS — K089 Disorder of teeth and supporting structures, unspecified: Secondary | ICD-10-CM | POA: Insufficient documentation

## 2010-10-07 DIAGNOSIS — K029 Dental caries, unspecified: Secondary | ICD-10-CM | POA: Insufficient documentation

## 2010-10-07 DIAGNOSIS — L0233 Carbuncle of buttock: Secondary | ICD-10-CM | POA: Insufficient documentation

## 2010-10-09 ENCOUNTER — Encounter (HOSPITAL_BASED_OUTPATIENT_CLINIC_OR_DEPARTMENT_OTHER): Payer: Medicare Other | Admitting: Hematology & Oncology

## 2010-10-09 ENCOUNTER — Other Ambulatory Visit: Payer: Self-pay | Admitting: Hematology & Oncology

## 2010-10-09 DIAGNOSIS — D72820 Lymphocytosis (symptomatic): Secondary | ICD-10-CM

## 2010-10-09 DIAGNOSIS — R05 Cough: Secondary | ICD-10-CM

## 2010-10-09 DIAGNOSIS — F17200 Nicotine dependence, unspecified, uncomplicated: Secondary | ICD-10-CM

## 2010-10-09 DIAGNOSIS — R599 Enlarged lymph nodes, unspecified: Secondary | ICD-10-CM

## 2010-10-09 LAB — CMP (CANCER CENTER ONLY)
ALT(SGPT): 29 U/L (ref 10–47)
AST: 19 U/L (ref 11–38)
Alkaline Phosphatase: 78 U/L (ref 26–84)
BUN, Bld: 13 mg/dL (ref 7–22)
Chloride: 95 mEq/L — ABNORMAL LOW (ref 98–108)
Creat: 0.8 mg/dl (ref 0.6–1.2)
Total Bilirubin: 0.7 mg/dl (ref 0.20–1.60)

## 2010-10-10 ENCOUNTER — Emergency Department (HOSPITAL_COMMUNITY)
Admission: EM | Admit: 2010-10-10 | Discharge: 2010-10-10 | Disposition: A | Discharge status: Home / Self Care | Payer: Medicare Other | Attending: Emergency Medicine | Admitting: Emergency Medicine

## 2010-10-10 DIAGNOSIS — F411 Generalized anxiety disorder: Secondary | ICD-10-CM | POA: Insufficient documentation

## 2010-10-10 DIAGNOSIS — E785 Hyperlipidemia, unspecified: Secondary | ICD-10-CM | POA: Insufficient documentation

## 2010-10-10 DIAGNOSIS — L02419 Cutaneous abscess of limb, unspecified: Secondary | ICD-10-CM | POA: Insufficient documentation

## 2010-10-10 DIAGNOSIS — Z79899 Other long term (current) drug therapy: Secondary | ICD-10-CM | POA: Insufficient documentation

## 2010-10-10 DIAGNOSIS — I10 Essential (primary) hypertension: Secondary | ICD-10-CM | POA: Insufficient documentation

## 2010-10-10 DIAGNOSIS — M79609 Pain in unspecified limb: Secondary | ICD-10-CM | POA: Insufficient documentation

## 2010-10-10 DIAGNOSIS — Z794 Long term (current) use of insulin: Secondary | ICD-10-CM | POA: Insufficient documentation

## 2010-10-10 DIAGNOSIS — E119 Type 2 diabetes mellitus without complications: Secondary | ICD-10-CM | POA: Insufficient documentation

## 2010-10-11 LAB — BASIC METABOLIC PANEL WITH GFR
BUN: 24 mg/dL — ABNORMAL HIGH (ref 6–23)
CO2: 25 meq/L (ref 19–32)
Calcium: 9.9 mg/dL (ref 8.4–10.5)
Chloride: 99 meq/L (ref 96–112)
Creatinine, Ser: 1.68 mg/dL — ABNORMAL HIGH (ref 0.4–1.5)
GFR calc non Af Amer: 43 mL/min — ABNORMAL LOW
Glucose, Bld: 276 mg/dL — ABNORMAL HIGH (ref 70–99)
Potassium: 4.4 meq/L (ref 3.5–5.1)
Sodium: 133 meq/L — ABNORMAL LOW (ref 135–145)

## 2010-10-11 LAB — CBC
HCT: 43.5 % (ref 39.0–52.0)
Hemoglobin: 14.9 g/dL (ref 13.0–17.0)
MCHC: 34.3 g/dL (ref 30.0–36.0)
MCV: 89.2 fL (ref 78.0–100.0)
Platelets: 231 10*3/uL (ref 150–400)
RBC: 4.88 MIL/uL (ref 4.22–5.81)
RDW: 14.1 % (ref 11.5–15.5)
WBC: 13.2 10*3/uL — ABNORMAL HIGH (ref 4.0–10.5)

## 2010-10-11 LAB — DIFFERENTIAL
Basophils Absolute: 0.1 10*3/uL (ref 0.0–0.1)
Basophils Relative: 1 % (ref 0–1)
Eosinophils Absolute: 0.3 10*3/uL (ref 0.0–0.7)
Eosinophils Relative: 3 % (ref 0–5)
Lymphocytes Relative: 19 % (ref 12–46)
Lymphs Abs: 2.5 10*3/uL (ref 0.7–4.0)
Monocytes Absolute: 1.1 10*3/uL — ABNORMAL HIGH (ref 0.1–1.0)
Monocytes Relative: 8 % (ref 3–12)
Neutro Abs: 9.2 10*3/uL — ABNORMAL HIGH (ref 1.7–7.7)
Neutrophils Relative %: 70 % (ref 43–77)

## 2010-10-11 LAB — POCT I-STAT, CHEM 8
BUN: 26 mg/dL — ABNORMAL HIGH (ref 6–23)
Calcium, Ion: 1.21 mmol/L (ref 1.12–1.32)
Chloride: 105 meq/L (ref 96–112)
Creatinine, Ser: 1.8 mg/dL — ABNORMAL HIGH (ref 0.4–1.5)
Glucose, Bld: 272 mg/dL — ABNORMAL HIGH (ref 70–99)
HCT: 45 % (ref 39.0–52.0)
Hemoglobin: 15.3 g/dL (ref 13.0–17.0)
Potassium: 4.5 meq/L (ref 3.5–5.1)
Sodium: 135 meq/L (ref 135–145)
TCO2: 23 mmol/L (ref 0–100)

## 2010-10-11 LAB — URINALYSIS, ROUTINE W REFLEX MICROSCOPIC
Bilirubin Urine: NEGATIVE
Hgb urine dipstick: NEGATIVE
Ketones, ur: NEGATIVE mg/dL
Specific Gravity, Urine: 1.013 (ref 1.005–1.030)
pH: 5.5 (ref 5.0–8.0)

## 2010-10-11 LAB — PROTIME-INR
INR: 1 (ref 0.00–1.49)
Prothrombin Time: 13.1 s (ref 11.6–15.2)

## 2010-10-16 ENCOUNTER — Other Ambulatory Visit: Payer: Self-pay | Admitting: Orthopedic Surgery

## 2010-10-16 DIAGNOSIS — M25561 Pain in right knee: Secondary | ICD-10-CM

## 2010-10-17 ENCOUNTER — Ambulatory Visit
Admission: RE | Admit: 2010-10-17 | Discharge: 2010-10-17 | Disposition: A | Discharge status: Home / Self Care | Payer: Medicare Other | Source: Ambulatory Visit | Attending: Orthopedic Surgery | Admitting: Orthopedic Surgery

## 2010-10-17 DIAGNOSIS — M25561 Pain in right knee: Secondary | ICD-10-CM

## 2010-10-19 ENCOUNTER — Inpatient Hospital Stay (HOSPITAL_COMMUNITY): Admission: RE | Admit: 2010-10-19 | Payer: Medicare Other | Source: Ambulatory Visit

## 2010-10-23 ENCOUNTER — Emergency Department (HOSPITAL_COMMUNITY)
Admission: EM | Admit: 2010-10-23 | Discharge: 2010-10-23 | Disposition: A | Discharge status: Home / Self Care | Payer: Medicare Other | Attending: Emergency Medicine | Admitting: Emergency Medicine

## 2010-10-23 ENCOUNTER — Emergency Department (HOSPITAL_COMMUNITY): Payer: Medicare Other

## 2010-10-23 DIAGNOSIS — R05 Cough: Secondary | ICD-10-CM | POA: Insufficient documentation

## 2010-10-23 DIAGNOSIS — Z794 Long term (current) use of insulin: Secondary | ICD-10-CM | POA: Insufficient documentation

## 2010-10-23 DIAGNOSIS — E119 Type 2 diabetes mellitus without complications: Secondary | ICD-10-CM | POA: Insufficient documentation

## 2010-10-23 DIAGNOSIS — E785 Hyperlipidemia, unspecified: Secondary | ICD-10-CM | POA: Insufficient documentation

## 2010-10-23 DIAGNOSIS — I10 Essential (primary) hypertension: Secondary | ICD-10-CM | POA: Insufficient documentation

## 2010-10-23 DIAGNOSIS — J40 Bronchitis, not specified as acute or chronic: Secondary | ICD-10-CM | POA: Insufficient documentation

## 2010-10-23 DIAGNOSIS — R059 Cough, unspecified: Secondary | ICD-10-CM | POA: Insufficient documentation

## 2010-10-25 ENCOUNTER — Encounter (HOSPITAL_COMMUNITY)
Admission: RE | Admit: 2010-10-25 | Discharge: 2010-10-25 | Disposition: A | Discharge status: Home / Self Care | Payer: Medicare Other | Source: Ambulatory Visit | Attending: Hematology & Oncology | Admitting: Hematology & Oncology

## 2010-10-25 ENCOUNTER — Encounter (HOSPITAL_COMMUNITY): Payer: Self-pay

## 2010-10-25 DIAGNOSIS — R599 Enlarged lymph nodes, unspecified: Secondary | ICD-10-CM | POA: Insufficient documentation

## 2010-10-25 DIAGNOSIS — J984 Other disorders of lung: Secondary | ICD-10-CM | POA: Insufficient documentation

## 2010-10-25 MED ORDER — FLUDEOXYGLUCOSE F - 18 (FDG) INJECTION
17.9000 | Freq: Once | INTRAVENOUS | Status: AC | PRN
  Administered 2010-10-25: 17.9 via INTRAVENOUS

## 2010-10-28 ENCOUNTER — Emergency Department (HOSPITAL_COMMUNITY)
Admission: EM | Admit: 2010-10-28 | Discharge: 2010-10-28 | Disposition: A | Discharge status: Home / Self Care | Payer: Medicare Other | Attending: Emergency Medicine | Admitting: Emergency Medicine

## 2010-10-28 DIAGNOSIS — R059 Cough, unspecified: Secondary | ICD-10-CM | POA: Insufficient documentation

## 2010-10-28 DIAGNOSIS — J209 Acute bronchitis, unspecified: Secondary | ICD-10-CM | POA: Insufficient documentation

## 2010-10-28 DIAGNOSIS — E119 Type 2 diabetes mellitus without complications: Secondary | ICD-10-CM | POA: Insufficient documentation

## 2010-10-28 DIAGNOSIS — Z79899 Other long term (current) drug therapy: Secondary | ICD-10-CM | POA: Insufficient documentation

## 2010-10-28 DIAGNOSIS — Z794 Long term (current) use of insulin: Secondary | ICD-10-CM | POA: Insufficient documentation

## 2010-10-28 DIAGNOSIS — F411 Generalized anxiety disorder: Secondary | ICD-10-CM | POA: Insufficient documentation

## 2010-10-28 DIAGNOSIS — R1110 Vomiting, unspecified: Secondary | ICD-10-CM | POA: Insufficient documentation

## 2010-10-28 DIAGNOSIS — I10 Essential (primary) hypertension: Secondary | ICD-10-CM | POA: Insufficient documentation

## 2010-10-28 DIAGNOSIS — R05 Cough: Secondary | ICD-10-CM | POA: Insufficient documentation

## 2010-10-28 DIAGNOSIS — J440 Chronic obstructive pulmonary disease with acute lower respiratory infection: Secondary | ICD-10-CM | POA: Insufficient documentation

## 2010-10-28 DIAGNOSIS — E785 Hyperlipidemia, unspecified: Secondary | ICD-10-CM | POA: Insufficient documentation

## 2010-10-28 LAB — GLUCOSE, CAPILLARY: Glucose-Capillary: 337 mg/dL — ABNORMAL HIGH (ref 70–99)

## 2010-11-09 ENCOUNTER — Encounter (HOSPITAL_BASED_OUTPATIENT_CLINIC_OR_DEPARTMENT_OTHER): Payer: Medicare Other | Admitting: Hematology & Oncology

## 2010-11-09 ENCOUNTER — Other Ambulatory Visit: Payer: Self-pay | Admitting: Hematology & Oncology

## 2010-11-09 DIAGNOSIS — R599 Enlarged lymph nodes, unspecified: Secondary | ICD-10-CM

## 2010-11-09 DIAGNOSIS — D72820 Lymphocytosis (symptomatic): Secondary | ICD-10-CM

## 2010-11-14 NOTE — Assessment & Plan Note (Signed)
Mr. Solum returns today.  He has several more physical therapy sessions  left.  He is a 60 year old male with lumbar radiculitis as well as  lumbar facet-mediated pain which has been relieved with medial branch  blocks.  His last block was approximately 2 months ago and is starting  to recur.   He has had no recurrence of his L2 radicular symptoms.  No numbness or  tingling in his thigh.   He has had no new medical problems, but has had a lot of travel in a car  over the holidays.   His average pain is in the 7/10 range, increases with activity such as  standing and improves with rest as well as medications.  He is able to  climb steps.  He is able to drive.  He can walk about 5 minutes at a  time.  He needs help with shopping, but otherwise independent.   REVIEW OF SYSTEMS:  Positive for anxiety as well as blood sugar control  problems.   PAST MEDICAL HISTORY:  Diabetes and high blood pressure.   SOCIAL HISTORY:  Single.  His family is in [place] and [place].   PHYSICAL EXAMINATION:  GENERAL:  Obese male in no acute distress.  Orientation x3.  Affect is alert.  Gait is with a limp.  VITAL SIGNS:  His blood pressure is 131/65, pulse 78, respirations 20,  and O2 sat 95% on room air.   Oswestry disability index score 42% is stable compared to last visit.  Overall, improved compared to November.   His lower extremity strength is normal.  Femoral stretch test is mildly  positive, but rarely symmetric bilaterally.  Straight leg raising test  is negative.  His back has decreased range of motion with extension  compared to flexion with increased pain during extension compared to  flexion.  Deep tendon reflexes are normal.  Gait shows no evidence of  toe drag or knee instability.   IMPRESSION:  Lumbar facet-mediated pain, improved with medial branch  blocks.  He has a history of lumbar disk disorder as well as lumbosacral  neuritis.   PLAN:  We will reschedule medial branch  blocks.  Continue oxycodone  10/325 t.i.d.  No signs of aberrant drug behaviors.      Erick Colace, M.D.  Electronically Signed     AEK/MedQ  D:  07/06/2008 13:40:28  T:  07/07/2008 04:30:55  Job #:  [id number]   cc:   Maryelizabeth Rowan, M.D.  Fax: [phone]

## 2010-11-14 NOTE — Assessment & Plan Note (Signed)
Derek Calderon follows up today.  He has had bilateral dorsal ramus  injection at L5, bilateral L4 and L3 medial branch blocks under  fluoroscopic guidance on May 17, 2008.  This has helped primarily  his right lower extremity pain.  He used to have radiating pain from his  back to his posterior thigh and in to his calf area.  He has been able  to do a little bit more activity since that time.  He has had no other  medical complications.  He continues to have some pain in his axial  spine area.  He has had no bowel or bladder dysfunction.   PAST MEDICAL HISTORY:  Significant for hypertension, diabetes, and  depression.   He does see a psychiatrist.   His Oswestry disability score today, 38% in comparison with 58% last  visit, which is a significant reduction.   His mood and affect are appropriate.  He has had no changes in his  medical condition other than Dr. Duanne Guess taking him off one of his  medications that was making him cough.  He did not recall which one it  was.   REVIEW OF SYSTEMS:  No bowel or bladder dysfunction.  He has had some  wheezing at times, otherwise negative.   FUNCTIONAL REVIEW:  He needs some help with shopping but otherwise  independent.  He climbs steps.  He drives.  He can walk 5 minutes at a  time.  Walking and standing do exacerbate his pain.   PHYSICAL EXAMINATION:  VITAL SIGNS:  161/97, pulse 104, respiratory rate  18, and O2 saturation is 97% on room air.  GENERAL:  In no acute distress.  Orientation x3.  Affect is alert.  Gait  is normal.  EXTREMITIES:  Without edema.  He has good strength in bilateral lower  extremities.  Normal deep tendon reflexes.  Normal hip, knee, and ankle  range of motion.  BACK:  Tenderness to palpation starting at the L4 paraspinals extending  to the S1 area.  He has pain with extension, greater than with flexion.   IMPRESSION:  1. Lumbar spondylosis without myelopathy.  2. Lumbar degenerative disk.   PLAN:  1. We  will continue his current medications which includes oxycodone      10/325 one p.o. t.i.d.  2. Hold off any further injection.  He is doing pretty well right now.  3. History of L2 radiculopathy.  This was improved after his injection      back in October with no recurrence.   I will, at this point, send him to physical therapy and have them do  some lumbar stabilization and aerobic conditioning and back to home  exercise program.  I will see him back in 1 month.   If he sees significant improvement, we may be able to reduce his  Percocet dose to 7.5.      Erick Colace, M.D.  Electronically Signed     AEK/MedQ  D:  06/04/2008 14:07:38  T:  06/05/2008 07:36:59  Job #:  [id number]   cc:   Maryelizabeth Rowan, M.D.  Fax: [phone]

## 2010-11-14 NOTE — Assessment & Plan Note (Signed)
[name] follows up.  I last saw him on August 23, 2008, for lumbar  medial branch blocks at L3-L4 and L5 dorsal ramus injection.  He had no  significant relief with this.  He had a prior relief with medial branch  blocks in November, but not at this time.  He has no significant right  lower extremity pain.  He has been doing more in terms of activity.  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  He is allowed to sit and stand.  He is requesting stronger  pain medicine.  He used to be on oxycodone 15 daily.  His last urine  screen was performed in September.   PHYSICAL EXAMINATION:  General, an obese male in no acute distress.  Mood and affect appropriate.  His back has some tenderness to palpation  at the lumbar paraspinals, mainly at the L4-5 levels.   His lumbar spine range of motion limited about 25% forward flexion,  extension, lateral rotation, and bending.  Lower extremity strength is  normal.  Hip range of motion is normal.  His lower extremity strength is  normal, hip flexor, knee extension, and ankle dorsiflexion.   IMPRESSION:  1. Lumbar pain, mainly diskogenic  2. Lumbar radiculitis, right L2 has continued good results from L2      epidural.   PLAN:  We will continue current medications Percocet 10/325 t.i.d.  We  will check urine drug screen and if appropriate, consider increasing to  the 15 mg dosage, he has been on in the past.      Erick Colace, M.D.  Electronically Signed     AEK/MedQ  D:  09/30/2008 09:50:00  T:  09/30/2008 21:08:19  Job #:  [id number]   cc:   Maryelizabeth Rowan, M.D.  Fax: [phone]

## 2010-11-14 NOTE — Procedures (Signed)
NAME:  Derek Calderon, Derek Calderon               ACCOUNT NO.:  [id number]   MEDICAL RECORD NO.:  [id number]           PATIENT TYPE:   LOCATION:                                 FACILITY:   PHYSICIAN:  Erick Colace, M.D.DATE OF BIRTH:  June 01, 1951   DATE OF PROCEDURE:  08/23/2008  DATE OF DISCHARGE:                               OPERATIVE REPORT   PROCEDURE:  Bilateral L5 dorsal ramus injection, bilateral L4 medial  branch block, bilateral L3 medial branch block under fluoroscopic  guidance.   INDICATIONS:  Lumbar pain, he has had previous good relief of 50% or  more lasting for about a week after the medial branch blocks at  corresponding levels in November 2009.  He is back for a confirmatory  set.   Informed consent was obtained after describing risks and benefits of the  procedure with the patient.  These include bleeding, bruising, and  infection.  He elects to proceed and has given written consent.  The  patient was placed prone on fluoroscopy table.  Betadine prep, sterile  drape, 25-gauge 1-1/2 inch needle was used to anesthetize the skin and  subcutaneous tissue, 1% lidocaine x2 mL, then 22-gauge 5-inch spinal  needle was inserted under fluoroscopic guidance.  Initially targeting  left S1 SAP sacral ala junction, bone contact made, confirmed with  lateral imaging.  Omnipaque 180 under live fluoro demonstrated no  intravascular uptake.  Then, 0.5 mL of solution containing 1 mL of 4  mg/mL dexamethasone and 2 mL of 2% MPF lidocaine.  Then, the left L5 SAP  transverse process junction, targeted bone contact made.  Omnipaque 180  under live fluoro demonstrated no intravascular uptake.  Then, 0.5 mL of  dexamethasone lidocaine solution was injected.  Then, the left L4 SAP  transverse process junction targeted, bone contact made, confirmed with  lateral imaging.  Omnipaque 180 under live fluoro demonstrated no  intravascular uptake.  Then, 0.5 mL dexamethasone lidocaine solution was  injected.  The same procedure was repeated on the right side at  corresponding levels using same needle injectate and technique.  The  patient tolerated the procedure well.  Pre- and post-injection vitals  stable.  Post injection instructions given.  Pre- and post-injection  pain level stable.  We will give him a pain diary and follow over time.      Erick Colace, M.D.  Electronically Signed     AEK/MEDQ  D:  08/23/2008 09:46:31  T:  08/24/2008 00:16:02  Job:  [id number]   cc:   Maryelizabeth Rowan, M.D.  Fax: [phone]

## 2010-11-14 NOTE — Assessment & Plan Note (Signed)
A 60 year old male who has a history of microdiskectomy at L2-L3 level  with chronic postoperative pain.  His most recent MRI was performed on  September 25, 2007, showing a moderate broad-based protrusion at L1-L2 and  some upward migration of disk material at L2-L3 with a broad-based  herniation of free fragment in the right lateral recess.   Fortunately after his L2 epidural, he has had improvement of his right  lower extremity pain so that his main pain is in his back.  In addition,  he does have a moderate disk bulge at L4 and L5 level.  He had bilateral  microdiskectomy at L4-L5 level in the past.   The patient is rethinking whether or not he wants surgery.  He is  feeling a bit better right now and is considering trying nonoperative  options.   Hydrocodone has not been particularly successful for his pain relief.  He states in the past oxycodone has work better.  After further  investigation, his urine drug screen was appropriate, given that he had  a prescription for a half medicine containing codeine.   CURRENT MEDICATIONS:  Include Abilify, glipizide, lorazepam, metformin,  Glucotrol, Toprol, and TriCor.   His Oswestry disability score today is 50%, which is in a severe  disability range.  He walks 5 minutes at a time.  He climbs steps.  He  drives.  He needs some assistance with household duties and shopping.   REVIEW OF SYSTEMS:  Positive for back spasms, anxiety, night sweats,  coughing, and wheezing.   His primary care physician is Dr. Duanne Guess, his spine surgeon is Dr. Otelia Sergeant,  and his psychiatrist is Dr. Corinda Gubler.   PHYSICAL EXAMINATION:  VITAL SIGNS:  Blood pressure 139/66, pulse 86,  respirations 16, O2 sat 93% on room air.  GENERAL:  In no acute distress, oriented x3.  His body habitus is obese.  Gait is normal.  BACK:  His back has some tenderness to palpation at lumbar paraspinals.  He has good strength in bilateral lower extremities with hip flexion,  knee  extension, ankle and dorsiflexion.  Normal sensation in lower  extremities.  Normal deep tendon reflexes.  His lower extremity range of  motion is normal.   IMPRESSION:  1. Lumbar degenerative disk.  2. L2 radiculopathy, improved after L2 transforaminal injections.  3. Low back pain, mainly axial, gets better with forward flexion,      worse with extension.  I suspect he may have a facet syndrome.  We      will need to check with medial branch blocks.  We can do this      without corticosteroids given his diabetes.   The patient states that he may want to postpone his surgery some now  that he is feeling a bit better.  We will switch him from hydrocodone to  oxycodone 10 mg t.i.d.  I will see him back for the injection next  month.      Erick Colace, M.D.  Electronically Signed     AEK/MedQ  D:  05/04/2008 11:43:56  T:  05/05/2008 00:03:56  Job #:  [id number]   cc:   Maryelizabeth Rowan, M.D.  Fax: [phone]   Kerrin Champagne, M.D.  Fax: [phone]
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
## 2010-11-14 NOTE — Procedures (Signed)
NAME:  Derek Calderon, Derek Calderon               ACCOUNT NO.:  [id number]   MEDICAL RECORD NO.:  [id number]         PATIENT TYPE:  [facility]   LOCATION:                                 FACILITY:   PHYSICIAN:  Erick Colace, M.D.DATE OF BIRTH:  August 18, 1950   DATE OF PROCEDURE:  05/17/2008  DATE OF DISCHARGE:                               OPERATIVE REPORT   PROCEDURE:  Bilateral L5 dorsal ramus injection, bilateral L4 medial  branch block, bilateral L3 medial branch block.   INDICATION:  Lumbar disk disease with chronic axial back pain.  Pain is  only partially responsive to medication management including narcotic  analgesics and interferes with self-care and mobility.   Informed consent was obtained after describing risks and benefits of the  procedure with the patient.  These include bleeding, bruising, and  infection.  He elects to proceed and has given written consent.  The  patient placed prone on fluoroscopy table.  Betadine prep, sterile  drape, 25-gauge 1-1/2-inch needle was used to anesthetize the skin and  subcutaneous tissue.  A 1% lidocaine x2 mL.  Then, a 22-gauge 5-inch  spinal needle was inserted under fluoroscopic guidance, first starting  left S1-SAP sacral ala junction, bone contact made, confirmed with  lateral imaging.  Omnipaque 180 x0.5 mL demonstrated no intravascular  uptake, then 0.5 mL of 2% MPF lidocaine was injected.  Then, left L5-SAP  transverse junction targeted, bone contact made.  Omnipaque 180 x0.5 mL  demonstrated no intravascular uptake, then 0.5 mL of 2% lidocaine  solution was injected.  Then, left L4-SAP transverse junction targeted,  bone contact made.  Omnipaque 180 x0.5 mL demonstrated no intravascular  uptake and 0.5 mL of the 2% lidocaine solution was injected.  Same  procedure was repeated on the right side using same needle injectate and  technique at corresponding levels.  The patient tolerated procedure  well.  Pre-injection pain level 9/10.  Post  injection 7/10.  We will  track over time if 50% reduction, would consider repeat confirmatory and  evaluate for radiofrequency procedure.      Erick Colace, M.D.  Electronically Signed     AEK/MEDQ  D:  05/17/2008 11:35:39  T:  05/18/2008 01:56:41  Job:  [id number]

## 2010-11-14 NOTE — Procedures (Signed)
NAME:  Derek Calderon, Derek Calderon NO.:  [id number]   MEDICAL RECORD NO.:  [id number]          PATIENT TYPE:  [facility]   LOCATION:  URG                          FACILITY:  [facility]   PHYSICIAN:  Erick Colace, M.D.DATE OF BIRTH:  18-Feb-1951   DATE OF PROCEDURE:  DATE OF DISCHARGE:  03/27/2008                               OPERATIVE REPORT   PROCEDURE:  Right L2-3 transforaminal lumbar epidural steroid injection  under fluoroscopic guidance.   INDICATION:  Right L2-3 radiculitis, history of L2-3 microdiskectomy.   Pain is only partially responsive to medication management including  narcotic analgesic medication.   Informed consent was obtained after describing risks and benefits of the  procedure with the patient.  These include bleeding, bruising, and  infection.  He elected to proceed and has given written consent.  The  patient placed prone on the fluoroscopy table.  Betadine prep, sterilely  draped.  A 25-gauge, 1-1/2-inch needle was used to anesthetize the skin  and subcu tissue with 1% lidocaine x2 cc.  Then, a 22-gauge, 3-1/2-inch  spinal needle was inserted under fluoroscopic guidance targeting the  right L2-3 vertebral foramina ,Omnipaque 180 under live fluoro  demonstrating good epidural and nerve root spread followed by injection  of 1 cc of 10 mg/cc dexamethasone with 2 cc of 1% MPF lidocaine.  The  patient tolerated the procedure well.  Pre- and post-injection vitals  stable.  Post-injection instructions were given.   Pre-injection pain 9 and post-injection pain 7.  We will follow up to  see how he does after the corticosteroid phase kicks in.      Erick Colace, M.D.  Electronically Signed     AEK/MEDQ  D:  04/05/2008 15:08:04  T:  04/06/2008 00:50:44  Job:  [id number]   cc:   Kerrin Champagne, M.D.  Fax: [phone]   Maryelizabeth Rowan, M.D.  Fax: [phone]

## 2010-11-14 NOTE — Assessment & Plan Note (Signed)
A 60 year old male with a lumbosacral disk disorder, multilevel,  lumbosacral neuritis radiculitis, and lumbar post-laminectomy facet pain  syndrome status post L2-L3 microdiskectomy on the right side in the  past.  His MRI is showing disk herniation at the 3 fragments right  lateral recess L2-L3 and also broad-based protrusion L1-L2.  The pain is  mainly in the thigh area.   His average pain is 8-9, described as dull, worse with walking, bending,  sitting, and standing.  He can walk 10 minutes at a time.  He can climb  steps.  He does not drive.  He needs assistance with dressing, household  duties, and shopping.   REVIEW OF SYSTEMS:  Positive numbness, tingling, and weakness right  lower extremity.   He had some weight gain, night sweats, wheezing, and coughing.  He has  had some bronchitis.  He is a diabetic with high blood pressure.   LABORATORY DATA:  Urine drug screen reviewed with the patient did show  signs of codeine.  He states that he has not been taking any codeine  pain pills, but he thinks maybe it could be codeine cluster.  It did  show up the oxycodone as expected and it was negative for hydrocodone.   PHYSICAL EXAMINATION:  GENERAL:  In no acute distress.  Mood and affect  appropriate.  Ambulation is without evidence of toe drag or knee  instability.  EXTREMITIES:  Without edema.  VITAL SIGNS:  Blood pressure 126/60, pulse 85, respirations 20, and O2  sat 95% on room air.   IMPRESSION:  1. Lumbosacral neuritis radiculitis.  2. Lumbosacral disk disorder.  3. As well as lumbar post-laminectomy syndrome.   Urine drug screen was inconsistent.  He does not have any medication  bottles with him today.  We did call Dr. Chauncy Passy office, where she  states she is the one that had really prescribed his medication.  She  faxed over med list, which included Tussionex, which was started only  after his urine drug screen, and there was no evidence of any codeine  cough  medicine.  He does state that he went to the urgent care some time  earlier last month and received a prescription.  I indicated that we  would need to see that prescription, he would have to bring it back for  [place] to take a look at and make sure that is a codeine-containing  medication prior to starting him on any narcotic analgesics.   We will go ahead and do the L2-L3 transforaminal injection.  Continue  Naprosyn.  He is still awaiting possible surgery with Dr. Otelia Sergeant, but  reportedly needs to get some preoperative medical clearance.      Derek Calderon, M.D.  Electronically Signed     AEK/MedQ  D:  04/05/2008 15:12:21  T:  04/06/2008 01:58:07  Job #:  [id number]

## 2010-11-16 ENCOUNTER — Encounter: Payer: Self-pay | Admitting: Pulmonary Disease

## 2010-11-17 ENCOUNTER — Encounter: Payer: Self-pay | Admitting: Pulmonary Disease

## 2010-11-17 ENCOUNTER — Ambulatory Visit (INDEPENDENT_AMBULATORY_CARE_PROVIDER_SITE_OTHER): Payer: Medicare Other | Admitting: Pulmonary Disease

## 2010-11-17 VITALS — BP 124/82 | HR 85 | Temp 98.3°F | Ht 73.0 in | Wt 281.6 lb

## 2010-11-17 DIAGNOSIS — R590 Localized enlarged lymph nodes: Secondary | ICD-10-CM

## 2010-11-17 DIAGNOSIS — R599 Enlarged lymph nodes, unspecified: Secondary | ICD-10-CM

## 2010-11-17 DIAGNOSIS — R05 Cough: Secondary | ICD-10-CM

## 2010-11-17 MED ORDER — BENZONATATE 100 MG PO CAPS: 200.0000 mg | ORAL_CAPSULE | Freq: Four times a day (QID) | ORAL | Status: DC | PRN

## 2010-11-17 NOTE — Assessment & Plan Note (Signed)
The pt's cough is most suggestive of an upper airway issue rather than lower.  I certainly think he may have some copd, and obviously needs to quit smoking.  However his history is c/w postnasal drip, reflux disease, and a cyclical cough mechanism.  I do not think his mild LN has anything to do with his cough.  Will treat for the above entities, and see how he responds.  He understands that his cough may never resolve if he does not quit smoking.

## 2010-11-17 NOTE — Progress Notes (Signed)
  Subjective:    Patient ID: Derek Calderon, male    DOB: [birth year], 60 y.o.   MRN: [id number]  HPI The pt is a 59y/o male who I have been asked to see for chronic cough.  The pt has had a cough for 3 mos, and he does not feel this started with any type of URI prodrome.  It is very dry in nature, and clearly has a cyclical component.  It is worse on lying down at night, and the pt feels he does have postnasal drip.  He also has reflux symptoms, and takes OTC meds for this.  He admits to having a fullness in his throat, and has frequent throat clearing.  He has a long h/o smoking, and continues to do so.  The pt has had a ct chest with an isolated enlarged right paratracheal LN, but no other significant findings.  He has been evaluated by oncology, with PET negative 10/25/10.     Review of Systems  Constitutional: Negative for fever and unexpected weight change.  HENT: Positive for sneezing and dental problem. Negative for ear pain, nosebleeds, congestion, sore throat, rhinorrhea, trouble swallowing, postnasal drip and sinus pressure.   Eyes: Negative for redness and itching.  Respiratory: Positive for cough. Negative for chest tightness, shortness of breath and wheezing.   Cardiovascular: Negative for palpitations and leg swelling.  Gastrointestinal: Negative for nausea and vomiting.  Genitourinary: Negative for dysuria.  Musculoskeletal: Negative for joint swelling.  Skin: Negative for rash.  Neurological: Negative for headaches.  Hematological: Does not bruise/bleed easily.  Psychiatric/Behavioral: Negative for dysphoric mood. The patient is not nervous/anxious.        Objective:   Physical Exam Constitutional:  Obese male, no acute distress  HENT:  Nares patent without discharge  Oropharynx without exudate, palate and uvula are normal  Eyes:  Perrla, eomi, no scleral icterus  Neck:  No JVD, no TMG  Cardiovascular:  Normal rate, regular rhythm, no rubs or gallops.  No murmurs      Intact distal pulses  Pulmonary :  Normal breath sounds, no stridor or respiratory distress   No rales, rhonchi, or wheezing  Abdominal:  Soft, nondistended, bowel sounds present.  No tenderness noted.   Musculoskeletal:  No lower extremity edema noted.  Lymph Nodes:  No cervical lymphadenopathy noted  Skin:  No cyanosis noted  Neurologic:  Alert, appropriate, moves all 4 extremities without obvious deficit.         Assessment & Plan:

## 2010-11-17 NOTE — Patient Instructions (Signed)
Chlorpheniramine 8mg  over the counter everynight at bedtime Take nasonex 2 sprays in each nostril each am Take dexilant 60mg  one each am Keep hard candy (no peppermint or menthol or cough drops) in mouth from arising to bedtime No throat clearing Tessalon pearls 100mg  2 every 6hrs if needed for cough You must stop smoking.  The cough may not go away completely until this is done.

## 2010-11-21 ENCOUNTER — Encounter: Payer: Self-pay | Admitting: Pulmonary Disease

## 2010-11-21 DIAGNOSIS — R590 Localized enlarged lymph nodes: Secondary | ICD-10-CM | POA: Insufficient documentation

## 2010-11-21 NOTE — Assessment & Plan Note (Signed)
There is an isolated mildly enlarged right paratracheal LN on ct with low metabolic activity on PET.  I do not think this has any to do with his cough.  Suspect is reactive, but does need followup.  Will leave to Dr. Myna Hidalgo.  If enlarging, could do EBUS or mediastinoscopy.

## 2010-11-25 ENCOUNTER — Emergency Department (HOSPITAL_COMMUNITY)
Admission: EM | Admit: 2010-11-25 | Discharge: 2010-11-25 | Disposition: A | Discharge status: Home / Self Care | Payer: Medicare Other | Attending: Emergency Medicine | Admitting: Emergency Medicine

## 2010-11-25 DIAGNOSIS — Z79899 Other long term (current) drug therapy: Secondary | ICD-10-CM | POA: Insufficient documentation

## 2010-11-25 DIAGNOSIS — E119 Type 2 diabetes mellitus without complications: Secondary | ICD-10-CM | POA: Insufficient documentation

## 2010-11-25 DIAGNOSIS — M549 Dorsalgia, unspecified: Secondary | ICD-10-CM | POA: Insufficient documentation

## 2010-11-25 DIAGNOSIS — Z794 Long term (current) use of insulin: Secondary | ICD-10-CM | POA: Insufficient documentation

## 2010-11-25 DIAGNOSIS — L0231 Cutaneous abscess of buttock: Secondary | ICD-10-CM | POA: Insufficient documentation

## 2010-11-25 DIAGNOSIS — E785 Hyperlipidemia, unspecified: Secondary | ICD-10-CM | POA: Insufficient documentation

## 2010-11-25 DIAGNOSIS — I10 Essential (primary) hypertension: Secondary | ICD-10-CM | POA: Insufficient documentation

## 2010-11-25 DIAGNOSIS — G8929 Other chronic pain: Secondary | ICD-10-CM | POA: Insufficient documentation

## 2010-12-01 ENCOUNTER — Ambulatory Visit (INDEPENDENT_AMBULATORY_CARE_PROVIDER_SITE_OTHER): Payer: Medicare Other | Admitting: Pulmonary Disease

## 2010-12-01 ENCOUNTER — Encounter: Payer: Self-pay | Admitting: Pulmonary Disease

## 2010-12-01 VITALS — BP 138/88 | HR 107 | Temp 98.2°F | Ht 73.0 in | Wt 271.6 lb

## 2010-12-01 DIAGNOSIS — R053 Chronic cough: Secondary | ICD-10-CM

## 2010-12-01 DIAGNOSIS — R05 Cough: Secondary | ICD-10-CM

## 2010-12-01 MED ORDER — OMEPRAZOLE 40 MG PO CPDR: 40.0000 mg | DELAYED_RELEASE_CAPSULE | Freq: Every day | ORAL | Status: DC

## 2010-12-01 NOTE — Progress Notes (Signed)
  Subjective:    Patient ID: Derek Calderon, male    DOB: [birth year], 60 y.o.   MRN: [id number]  HPI The pt comes in today for f/u of his chronic cough.  At the last visit, he was felt to have ua > lower airway cough due to postnasal drip, LPR, and cyclical cough mechanism.  However, the pt also is an ongoing smoker, and an element of COPD could not be ruled out.  He has been treated aggressively for PND and reflux, and has cut back on his smoking.  He feels his cough has improved at least 50%.     Review of Systems  Constitutional: Negative for fever and unexpected weight change.  HENT: Positive for congestion, rhinorrhea, sneezing, postnasal drip and sinus pressure. Negative for ear pain, nosebleeds, sore throat, trouble swallowing and dental problem.   Eyes: Negative for redness and itching.  Respiratory: Positive for cough and wheezing. Negative for chest tightness and shortness of breath.   Cardiovascular: Negative for palpitations and leg swelling.  Gastrointestinal: Negative for nausea and vomiting.  Genitourinary: Negative for dysuria.  Musculoskeletal: Negative for joint swelling.  Skin: Negative for rash.  Neurological: Negative for headaches.  Hematological: Does not bruise/bleed easily.  Psychiatric/Behavioral: Negative for dysphoric mood. The patient is not nervous/anxious.        Objective:   Physical Exam Obese male in nad Nares without purulence or discharge Chest clear LE without edema, no cyanosis Alert, oriented, moves all 4        Assessment & Plan:

## 2010-12-01 NOTE — Patient Instructions (Signed)
Will keep you on medication for reflux.  Will give you prescription for omeprazole 40mg  one each night at bedtime Continue on nasal spray and antihistamine for postnasal drip as needed. Try and totally quit smoking.  It can continue to irritate throat, and aggravate cough.   If your cough persists, please let me know.

## 2010-12-01 NOTE — Assessment & Plan Note (Signed)
The pt's cough is 50% better with treatment of upper airway irritation and reduced smoking.  His spirometry is normal today, so I doubt this represents a lower airway issue.  He feels the treatment of reflux has helped more than anything else.  Will continue with PPI at hs, and also treatment of LPR.  I have also encouraged him to stop smoking completely, since this is a potent airway irritant.  I would expect his cough to continue to improve over time, and ultimately resolve.  If it does not, he is to let me know.

## 2010-12-11 ENCOUNTER — Emergency Department (HOSPITAL_COMMUNITY)
Admission: EM | Admit: 2010-12-11 | Discharge: 2010-12-12 | Disposition: A | Discharge status: Home / Self Care | Payer: Medicare Other | Attending: Emergency Medicine | Admitting: Emergency Medicine

## 2010-12-11 DIAGNOSIS — I10 Essential (primary) hypertension: Secondary | ICD-10-CM | POA: Insufficient documentation

## 2010-12-11 DIAGNOSIS — R109 Unspecified abdominal pain: Secondary | ICD-10-CM | POA: Insufficient documentation

## 2010-12-11 DIAGNOSIS — Z794 Long term (current) use of insulin: Secondary | ICD-10-CM | POA: Insufficient documentation

## 2010-12-11 DIAGNOSIS — E119 Type 2 diabetes mellitus without complications: Secondary | ICD-10-CM | POA: Insufficient documentation

## 2010-12-11 DIAGNOSIS — E785 Hyperlipidemia, unspecified: Secondary | ICD-10-CM | POA: Insufficient documentation

## 2010-12-11 DIAGNOSIS — Z79899 Other long term (current) drug therapy: Secondary | ICD-10-CM | POA: Insufficient documentation

## 2010-12-11 DIAGNOSIS — L02219 Cutaneous abscess of trunk, unspecified: Secondary | ICD-10-CM | POA: Insufficient documentation

## 2010-12-11 DIAGNOSIS — R609 Edema, unspecified: Secondary | ICD-10-CM | POA: Insufficient documentation

## 2010-12-11 DIAGNOSIS — F411 Generalized anxiety disorder: Secondary | ICD-10-CM | POA: Insufficient documentation

## 2010-12-19 ENCOUNTER — Emergency Department (HOSPITAL_COMMUNITY)
Admission: EM | Admit: 2010-12-19 | Discharge: 2010-12-19 | Disposition: A | Discharge status: Home / Self Care | Payer: Medicare Other | Attending: Emergency Medicine | Admitting: Emergency Medicine

## 2010-12-19 DIAGNOSIS — L738 Other specified follicular disorders: Secondary | ICD-10-CM | POA: Insufficient documentation

## 2010-12-19 DIAGNOSIS — E119 Type 2 diabetes mellitus without complications: Secondary | ICD-10-CM | POA: Insufficient documentation

## 2010-12-19 DIAGNOSIS — E785 Hyperlipidemia, unspecified: Secondary | ICD-10-CM | POA: Insufficient documentation

## 2010-12-19 DIAGNOSIS — I10 Essential (primary) hypertension: Secondary | ICD-10-CM | POA: Insufficient documentation

## 2010-12-23 ENCOUNTER — Other Ambulatory Visit: Payer: Self-pay | Admitting: Internal Medicine

## 2010-12-23 ENCOUNTER — Emergency Department (HOSPITAL_COMMUNITY)
Admission: EM | Admit: 2010-12-23 | Discharge: 2010-12-23 | Disposition: A | Discharge status: Home / Self Care | Payer: Medicare Other | Attending: Emergency Medicine | Admitting: Emergency Medicine

## 2010-12-23 DIAGNOSIS — R609 Edema, unspecified: Secondary | ICD-10-CM | POA: Insufficient documentation

## 2010-12-23 DIAGNOSIS — M549 Dorsalgia, unspecified: Secondary | ICD-10-CM | POA: Insufficient documentation

## 2010-12-23 DIAGNOSIS — Z79899 Other long term (current) drug therapy: Secondary | ICD-10-CM | POA: Insufficient documentation

## 2010-12-23 DIAGNOSIS — E785 Hyperlipidemia, unspecified: Secondary | ICD-10-CM | POA: Insufficient documentation

## 2010-12-23 DIAGNOSIS — L02219 Cutaneous abscess of trunk, unspecified: Secondary | ICD-10-CM | POA: Insufficient documentation

## 2010-12-23 DIAGNOSIS — Z794 Long term (current) use of insulin: Secondary | ICD-10-CM | POA: Insufficient documentation

## 2010-12-23 DIAGNOSIS — I10 Essential (primary) hypertension: Secondary | ICD-10-CM | POA: Insufficient documentation

## 2010-12-23 DIAGNOSIS — G8929 Other chronic pain: Secondary | ICD-10-CM | POA: Insufficient documentation

## 2010-12-23 DIAGNOSIS — E119 Type 2 diabetes mellitus without complications: Secondary | ICD-10-CM | POA: Insufficient documentation

## 2011-02-05 ENCOUNTER — Encounter (HOSPITAL_BASED_OUTPATIENT_CLINIC_OR_DEPARTMENT_OTHER): Payer: Self-pay

## 2011-02-05 ENCOUNTER — Ambulatory Visit (HOSPITAL_BASED_OUTPATIENT_CLINIC_OR_DEPARTMENT_OTHER)
Admission: RE | Admit: 2011-02-05 | Discharge: 2011-02-05 | Disposition: A | Discharge status: Home / Self Care | Payer: Medicare Other | Source: Ambulatory Visit | Attending: Hematology & Oncology | Admitting: Hematology & Oncology

## 2011-02-05 DIAGNOSIS — R599 Enlarged lymph nodes, unspecified: Secondary | ICD-10-CM | POA: Insufficient documentation

## 2011-02-05 DIAGNOSIS — J9859 Other diseases of mediastinum, not elsewhere classified: Secondary | ICD-10-CM

## 2011-02-05 MED ORDER — IOHEXOL 300 MG/ML  SOLN
100.0000 mL | Freq: Once | INTRAMUSCULAR | Status: AC | PRN
  Administered 2011-02-05: 100 mL via INTRAVENOUS

## 2011-02-11 ENCOUNTER — Emergency Department (HOSPITAL_COMMUNITY)
Admission: EM | Admit: 2011-02-11 | Discharge: 2011-02-11 | Disposition: A | Discharge status: Home / Self Care | Payer: Medicare Other | Attending: Emergency Medicine | Admitting: Emergency Medicine

## 2011-02-11 DIAGNOSIS — E785 Hyperlipidemia, unspecified: Secondary | ICD-10-CM | POA: Insufficient documentation

## 2011-02-11 DIAGNOSIS — M549 Dorsalgia, unspecified: Secondary | ICD-10-CM | POA: Insufficient documentation

## 2011-02-11 DIAGNOSIS — Z79899 Other long term (current) drug therapy: Secondary | ICD-10-CM | POA: Insufficient documentation

## 2011-02-11 DIAGNOSIS — G8929 Other chronic pain: Secondary | ICD-10-CM | POA: Insufficient documentation

## 2011-02-11 DIAGNOSIS — Z794 Long term (current) use of insulin: Secondary | ICD-10-CM | POA: Insufficient documentation

## 2011-02-11 DIAGNOSIS — L02419 Cutaneous abscess of limb, unspecified: Secondary | ICD-10-CM | POA: Insufficient documentation

## 2011-02-11 DIAGNOSIS — E119 Type 2 diabetes mellitus without complications: Secondary | ICD-10-CM | POA: Insufficient documentation

## 2011-02-11 DIAGNOSIS — I10 Essential (primary) hypertension: Secondary | ICD-10-CM | POA: Insufficient documentation

## 2011-02-11 DIAGNOSIS — A4902 Methicillin resistant Staphylococcus aureus infection, unspecified site: Secondary | ICD-10-CM | POA: Insufficient documentation

## 2011-02-12 ENCOUNTER — Encounter (HOSPITAL_BASED_OUTPATIENT_CLINIC_OR_DEPARTMENT_OTHER): Payer: Medicare Other | Admitting: Hematology & Oncology

## 2011-02-12 DIAGNOSIS — D72820 Lymphocytosis (symptomatic): Secondary | ICD-10-CM

## 2011-03-11 ENCOUNTER — Emergency Department (HOSPITAL_COMMUNITY)
Admission: EM | Admit: 2011-03-11 | Discharge: 2011-03-11 | Disposition: A | Discharge status: Home / Self Care | Payer: Medicare Other | Attending: Emergency Medicine | Admitting: Emergency Medicine

## 2011-03-11 DIAGNOSIS — E785 Hyperlipidemia, unspecified: Secondary | ICD-10-CM | POA: Insufficient documentation

## 2011-03-11 DIAGNOSIS — I10 Essential (primary) hypertension: Secondary | ICD-10-CM | POA: Insufficient documentation

## 2011-03-11 DIAGNOSIS — E119 Type 2 diabetes mellitus without complications: Secondary | ICD-10-CM | POA: Insufficient documentation

## 2011-03-11 DIAGNOSIS — K089 Disorder of teeth and supporting structures, unspecified: Secondary | ICD-10-CM | POA: Insufficient documentation

## 2011-03-11 DIAGNOSIS — K029 Dental caries, unspecified: Secondary | ICD-10-CM | POA: Insufficient documentation

## 2011-03-23 ENCOUNTER — Emergency Department (HOSPITAL_COMMUNITY): Payer: Medicare Other

## 2011-03-23 ENCOUNTER — Emergency Department (HOSPITAL_COMMUNITY)
Admission: EM | Admit: 2011-03-23 | Discharge: 2011-03-23 | Disposition: A | Discharge status: Home / Self Care | Payer: Medicare Other | Attending: Emergency Medicine | Admitting: Emergency Medicine

## 2011-03-23 DIAGNOSIS — E785 Hyperlipidemia, unspecified: Secondary | ICD-10-CM | POA: Insufficient documentation

## 2011-03-23 DIAGNOSIS — M25579 Pain in unspecified ankle and joints of unspecified foot: Secondary | ICD-10-CM | POA: Insufficient documentation

## 2011-03-23 DIAGNOSIS — S93409A Sprain of unspecified ligament of unspecified ankle, initial encounter: Secondary | ICD-10-CM | POA: Insufficient documentation

## 2011-03-23 DIAGNOSIS — I10 Essential (primary) hypertension: Secondary | ICD-10-CM | POA: Insufficient documentation

## 2011-03-23 DIAGNOSIS — Y92009 Unspecified place in unspecified non-institutional (private) residence as the place of occurrence of the external cause: Secondary | ICD-10-CM | POA: Insufficient documentation

## 2011-03-23 DIAGNOSIS — M25473 Effusion, unspecified ankle: Secondary | ICD-10-CM | POA: Insufficient documentation

## 2011-03-23 DIAGNOSIS — M25476 Effusion, unspecified foot: Secondary | ICD-10-CM | POA: Insufficient documentation

## 2011-03-23 DIAGNOSIS — E119 Type 2 diabetes mellitus without complications: Secondary | ICD-10-CM | POA: Insufficient documentation

## 2011-03-23 DIAGNOSIS — X500XXA Overexertion from strenuous movement or load, initial encounter: Secondary | ICD-10-CM | POA: Insufficient documentation

## 2011-03-23 DIAGNOSIS — Z79899 Other long term (current) drug therapy: Secondary | ICD-10-CM | POA: Insufficient documentation

## 2011-03-23 DIAGNOSIS — F411 Generalized anxiety disorder: Secondary | ICD-10-CM | POA: Insufficient documentation

## 2011-04-07 ENCOUNTER — Emergency Department (HOSPITAL_COMMUNITY)
Admission: EM | Admit: 2011-04-07 | Discharge: 2011-04-07 | Disposition: A | Discharge status: Home / Self Care | Payer: Medicare Other | Attending: Emergency Medicine | Admitting: Emergency Medicine

## 2011-04-07 DIAGNOSIS — K1370 Unspecified lesions of oral mucosa: Secondary | ICD-10-CM | POA: Insufficient documentation

## 2011-04-07 DIAGNOSIS — I10 Essential (primary) hypertension: Secondary | ICD-10-CM | POA: Insufficient documentation

## 2011-04-07 DIAGNOSIS — K029 Dental caries, unspecified: Secondary | ICD-10-CM | POA: Insufficient documentation

## 2011-04-07 DIAGNOSIS — E119 Type 2 diabetes mellitus without complications: Secondary | ICD-10-CM | POA: Insufficient documentation

## 2011-04-07 DIAGNOSIS — Z794 Long term (current) use of insulin: Secondary | ICD-10-CM | POA: Insufficient documentation

## 2011-04-07 DIAGNOSIS — E785 Hyperlipidemia, unspecified: Secondary | ICD-10-CM | POA: Insufficient documentation

## 2011-04-07 DIAGNOSIS — K089 Disorder of teeth and supporting structures, unspecified: Secondary | ICD-10-CM | POA: Insufficient documentation

## 2011-04-17 ENCOUNTER — Emergency Department (HOSPITAL_COMMUNITY)
Admission: EM | Admit: 2011-04-17 | Discharge: 2011-04-17 | Disposition: A | Discharge status: Home / Self Care | Payer: Medicare Other | Attending: Emergency Medicine | Admitting: Emergency Medicine

## 2011-04-17 DIAGNOSIS — M722 Plantar fascial fibromatosis: Secondary | ICD-10-CM | POA: Insufficient documentation

## 2011-04-17 DIAGNOSIS — E119 Type 2 diabetes mellitus without complications: Secondary | ICD-10-CM | POA: Insufficient documentation

## 2011-04-17 DIAGNOSIS — I10 Essential (primary) hypertension: Secondary | ICD-10-CM | POA: Insufficient documentation

## 2011-04-17 DIAGNOSIS — K029 Dental caries, unspecified: Secondary | ICD-10-CM | POA: Insufficient documentation

## 2011-04-17 DIAGNOSIS — E785 Hyperlipidemia, unspecified: Secondary | ICD-10-CM | POA: Insufficient documentation

## 2011-04-17 DIAGNOSIS — F411 Generalized anxiety disorder: Secondary | ICD-10-CM | POA: Insufficient documentation

## 2011-04-17 DIAGNOSIS — M549 Dorsalgia, unspecified: Secondary | ICD-10-CM | POA: Insufficient documentation

## 2011-04-17 DIAGNOSIS — Z79899 Other long term (current) drug therapy: Secondary | ICD-10-CM | POA: Insufficient documentation

## 2011-04-17 DIAGNOSIS — K089 Disorder of teeth and supporting structures, unspecified: Secondary | ICD-10-CM | POA: Insufficient documentation

## 2011-04-17 DIAGNOSIS — G8929 Other chronic pain: Secondary | ICD-10-CM | POA: Insufficient documentation

## 2011-04-17 DIAGNOSIS — Z794 Long term (current) use of insulin: Secondary | ICD-10-CM | POA: Insufficient documentation

## 2011-05-06 ENCOUNTER — Emergency Department (HOSPITAL_COMMUNITY)
Admission: EM | Admit: 2011-05-06 | Discharge: 2011-05-07 | Disposition: A | Discharge status: Home / Self Care | Payer: Medicare Other | Attending: Emergency Medicine | Admitting: Emergency Medicine

## 2011-05-06 ENCOUNTER — Emergency Department (HOSPITAL_COMMUNITY): Payer: Medicare Other

## 2011-05-06 ENCOUNTER — Encounter (HOSPITAL_COMMUNITY): Payer: Self-pay | Admitting: *Deleted

## 2011-05-06 DIAGNOSIS — S8390XA Sprain of unspecified site of unspecified knee, initial encounter: Secondary | ICD-10-CM

## 2011-05-06 DIAGNOSIS — Z794 Long term (current) use of insulin: Secondary | ICD-10-CM | POA: Insufficient documentation

## 2011-05-06 DIAGNOSIS — I10 Essential (primary) hypertension: Secondary | ICD-10-CM | POA: Insufficient documentation

## 2011-05-06 DIAGNOSIS — F17200 Nicotine dependence, unspecified, uncomplicated: Secondary | ICD-10-CM | POA: Insufficient documentation

## 2011-05-06 DIAGNOSIS — E119 Type 2 diabetes mellitus without complications: Secondary | ICD-10-CM | POA: Insufficient documentation

## 2011-05-06 DIAGNOSIS — W1809XA Striking against other object with subsequent fall, initial encounter: Secondary | ICD-10-CM | POA: Insufficient documentation

## 2011-05-06 DIAGNOSIS — M25569 Pain in unspecified knee: Secondary | ICD-10-CM | POA: Insufficient documentation

## 2011-05-06 DIAGNOSIS — M7989 Other specified soft tissue disorders: Secondary | ICD-10-CM | POA: Insufficient documentation

## 2011-05-06 DIAGNOSIS — IMO0002 Reserved for concepts with insufficient information to code with codable children: Secondary | ICD-10-CM | POA: Insufficient documentation

## 2011-05-06 DIAGNOSIS — E785 Hyperlipidemia, unspecified: Secondary | ICD-10-CM | POA: Insufficient documentation

## 2011-05-06 MED ORDER — IPRATROPIUM BROMIDE 0.02 % IN SOLN: 0.5000 mg | Freq: Once | RESPIRATORY_TRACT | Status: DC

## 2011-05-06 MED ORDER — PREDNISONE 20 MG PO TABS: 60.0000 mg | ORAL_TABLET | Freq: Every day | ORAL | Status: DC

## 2011-05-06 MED ORDER — ALBUTEROL SULFATE (5 MG/ML) 0.5% IN NEBU: 5.0000 mg | INHALATION_SOLUTION | Freq: Once | RESPIRATORY_TRACT | Status: DC

## 2011-05-06 NOTE — ED Notes (Signed)
Resting on bed , waiting for EDP/NP evaluation . Respirations unlabored.

## 2011-05-06 NOTE — ED Notes (Signed)
Lt knee from a fall earlier today.

## 2011-05-07 MED ORDER — HYDROCODONE-ACETAMINOPHEN 5-500 MG PO TABS: 1.0000 | ORAL_TABLET | Freq: Four times a day (QID) | ORAL | Status: AC | PRN

## 2011-05-07 NOTE — ED Provider Notes (Signed)
History     CSN: [id number] Arrival date & time: 05/06/2011  8:23 PM   None     Chief Complaint  Patient presents with  . Knee Pain    (Consider location/radiation/quality/duration/timing/severity/associated sxs/prior treatment) HPI Comments: Patient presents after falling onto his left knee and striking the knee cap - reports pain with ambulation and bending the knee.  Patient is a 60 y.o. male presenting with knee pain. The history is provided by the patient. No language interpreter was used.  Knee Pain This is a new problem. The current episode started in the past 7 days. The problem occurs constantly. The problem has been unchanged. Associated symptoms include arthralgias and joint swelling. Pertinent negatives include no abdominal pain, chills, coughing, fever, headaches, myalgias, neck pain, numbness, swollen glands, visual change or weakness. The symptoms are aggravated by bending and twisting. He has tried immobilization and NSAIDs for the symptoms. The treatment provided mild relief.    Past Medical History  Diagnosis Date  . Lymphocytosis   . Mediastinal lymphadenopathy   . Diabetes mellitus   . Hyperlipidemia   . HTN (hypertension)     Past Surgical History  Procedure Date  . Knee surgery 11-01-2010    right  . Carpal tunnel release 08-2009    right    Family History  Problem Relation Age of Onset  . Allergies Sister   . Cancer Mother     stomach  . Pancreatic cancer Father     History  Substance Use Topics  . Smoking status: Current Everyday Smoker -- 2.0 packs/day for 15 years    Types: Cigarettes  . Smokeless tobacco: Not on file  . Alcohol Use: No     quit in 2005      Review of Systems  Constitutional: Negative for fever and chills.  HENT: Negative.  Negative for neck pain.   Eyes: Negative.   Respiratory: Negative.  Negative for cough.   Cardiovascular: Negative.   Gastrointestinal: Negative.  Negative for abdominal pain.  Genitourinary:  Negative.   Musculoskeletal: Positive for joint swelling, arthralgias and gait problem. Negative for myalgias and back pain.  Neurological: Negative.  Negative for dizziness, weakness, numbness and headaches.  Hematological: Negative.   Psychiatric/Behavioral: Negative.     Allergies  Lunesta  Home Medications   Current Outpatient Rx  Name Route Sig Dispense Refill  . ALBUTEROL SULFATE HFA 108 (90 BASE) MCG/ACT IN AERS Inhalation Inhale 2 puffs into the lungs every 6 (six) hours as needed. For shortness of breath    . ATORVASTATIN CALCIUM 10 MG PO TABS Oral Take 10 mg by mouth daily.      Marland Kitchen HYDROCODONE-ACETAMINOPHEN 5-500 MG PO TABS Oral Take 1 tablet by mouth every 6 (six) hours as needed. For pain     . INSULIN GLARGINE 100 UNIT/ML [place] SOLN Subcutaneous Inject 15 Units into the skin at bedtime.     Marland Kitchen LEVOTHYROXINE SODIUM 25 MCG PO TABS Oral Take 25 mcg by mouth daily.      Marland Kitchen VICTOZA [place] Subcutaneous Inject 1.8 mg into the skin daily.     Marland Kitchen METFORMIN HCL 1000 MG PO TABS Oral Take 1,000 mg by mouth 2 (two) times daily with a meal.      . METOPROLOL TARTRATE 25 MG PO TABS Oral Take 25 mg by mouth 2 (two) times daily.      Marland Kitchen PAROXETINE HCL 40 MG PO TABS Oral Take 40 mg by mouth every morning.      Marland Kitchen  TESTOSTERONE 50 MG/5GM TD GEL Transdermal Place 5 g onto the skin daily.      . TRAZODONE HCL 50 MG PO TABS Oral Take 0.5-1 tablets by mouth at bedtime as needed. For sleep      BP 116/88  Pulse 100  Temp 97.3 F (36.3 C)  Resp 22  SpO2 95%  Physical Exam  Constitutional: He is oriented to person, place, and time. He appears well-developed and well-nourished.  HENT:  Head: Normocephalic and atraumatic.  Eyes: Conjunctivae are normal. Pupils are equal, round, and reactive to light.  Neck: Normal range of motion. Neck supple.  Cardiovascular: Normal rate, regular rhythm and normal heart sounds.   Pulmonary/Chest: Effort normal and breath sounds normal.  Abdominal: Soft. There is no  tenderness.  Musculoskeletal:       Left knee: He exhibits decreased range of motion and swelling. He exhibits no effusion, no deformity, no erythema, normal alignment, no LCL laxity and normal patellar mobility. tenderness found. Medial joint line and patellar tendon tenderness noted. No MCL and no LCL tenderness noted.  Neurological: He is alert and oriented to person, place, and time.  Skin: Skin is warm and dry.  Psychiatric: He has a normal mood and affect. His behavior is normal. Judgment and thought content normal.    ED Course  Procedures (including critical care time)  Labs Reviewed - No data to display Dg Knee Complete 4 Views Left  05/06/2011  *RADIOLOGY REPORT*  Clinical Data: Injury  LEFT KNEE - COMPLETE 4+ VIEW  Comparison: None.  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  No acute fracture and no dislocation.  IMPRESSION: Degenerative change.  No acute bony pathology.  Original Report Authenticated By: Donavan Burnet, M.D.     Patellar tendonitis    MDM  Review of x-ray reveals no acute process - this is likely patellar tendonitis - patient has relationship with Dr. Sherlean Foot and will follow up with him.        Izola Price [place], [place] 05/11/11 [phone]

## 2011-05-08 ENCOUNTER — Ambulatory Visit
Admission: RE | Admit: 2011-05-08 | Discharge: 2011-05-08 | Disposition: A | Discharge status: Home / Self Care | Payer: Medicare Other | Source: Ambulatory Visit | Attending: Specialist | Admitting: Specialist

## 2011-05-08 ENCOUNTER — Other Ambulatory Visit: Payer: Self-pay | Admitting: Specialist

## 2011-05-08 DIAGNOSIS — M79673 Pain in unspecified foot: Secondary | ICD-10-CM

## 2011-05-11 NOTE — ED Provider Notes (Signed)
Medical screening examination/treatment/procedure(s) were performed by non-physician practitioner and as supervising physician I was immediately available for consultation/collaboration.  Skylan Lara M Kadra Kohan, MD 05/11/11 0713 

## 2011-05-23 ENCOUNTER — Encounter (HOSPITAL_COMMUNITY): Payer: Self-pay | Admitting: Emergency Medicine

## 2011-05-23 ENCOUNTER — Emergency Department (HOSPITAL_COMMUNITY)
Admission: EM | Admit: 2011-05-23 | Discharge: 2011-05-23 | Disposition: A | Discharge status: Home / Self Care | Payer: Medicare Other | Attending: Emergency Medicine | Admitting: Emergency Medicine

## 2011-05-23 ENCOUNTER — Emergency Department (HOSPITAL_COMMUNITY): Payer: Medicare Other

## 2011-05-23 DIAGNOSIS — S40019A Contusion of unspecified shoulder, initial encounter: Secondary | ICD-10-CM | POA: Insufficient documentation

## 2011-05-23 DIAGNOSIS — E785 Hyperlipidemia, unspecified: Secondary | ICD-10-CM | POA: Insufficient documentation

## 2011-05-23 DIAGNOSIS — Z794 Long term (current) use of insulin: Secondary | ICD-10-CM | POA: Insufficient documentation

## 2011-05-23 DIAGNOSIS — Z79899 Other long term (current) drug therapy: Secondary | ICD-10-CM | POA: Insufficient documentation

## 2011-05-23 DIAGNOSIS — W010XXA Fall on same level from slipping, tripping and stumbling without subsequent striking against object, initial encounter: Secondary | ICD-10-CM | POA: Insufficient documentation

## 2011-05-23 DIAGNOSIS — S139XXA Sprain of joints and ligaments of unspecified parts of neck, initial encounter: Secondary | ICD-10-CM | POA: Insufficient documentation

## 2011-05-23 DIAGNOSIS — M542 Cervicalgia: Secondary | ICD-10-CM | POA: Insufficient documentation

## 2011-05-23 DIAGNOSIS — Y93E1 Activity, personal bathing and showering: Secondary | ICD-10-CM | POA: Insufficient documentation

## 2011-05-23 DIAGNOSIS — S40012A Contusion of left shoulder, initial encounter: Secondary | ICD-10-CM

## 2011-05-23 DIAGNOSIS — I10 Essential (primary) hypertension: Secondary | ICD-10-CM | POA: Insufficient documentation

## 2011-05-23 DIAGNOSIS — E119 Type 2 diabetes mellitus without complications: Secondary | ICD-10-CM | POA: Insufficient documentation

## 2011-05-23 DIAGNOSIS — S161XXA Strain of muscle, fascia and tendon at neck level, initial encounter: Secondary | ICD-10-CM

## 2011-05-23 DIAGNOSIS — M25519 Pain in unspecified shoulder: Secondary | ICD-10-CM | POA: Insufficient documentation

## 2011-05-23 DIAGNOSIS — Z9889 Other specified postprocedural states: Secondary | ICD-10-CM | POA: Insufficient documentation

## 2011-05-23 DIAGNOSIS — F17200 Nicotine dependence, unspecified, uncomplicated: Secondary | ICD-10-CM | POA: Insufficient documentation

## 2011-05-23 MED ORDER — NAPROXEN 500 MG PO TABS: 500.0000 mg | ORAL_TABLET | Freq: Two times a day (BID) | ORAL | Status: DC

## 2011-05-23 MED ORDER — KETOROLAC TROMETHAMINE 60 MG/2ML IM SOLN
60.0000 mg | Freq: Once | INTRAMUSCULAR | Status: AC
  Administered 2011-05-23: 60 mg via INTRAMUSCULAR
  Filled 2011-05-23: qty 2

## 2011-05-23 MED ORDER — HYDROCODONE-ACETAMINOPHEN 5-500 MG PO TABS: 1.0000 | ORAL_TABLET | Freq: Four times a day (QID) | ORAL | Status: AC | PRN

## 2011-05-23 MED ORDER — METHOCARBAMOL 500 MG PO TABS: 500.0000 mg | ORAL_TABLET | Freq: Two times a day (BID) | ORAL | Status: AC | PRN

## 2011-05-23 NOTE — ED Notes (Signed)
Log rolled off backboard with assist.

## 2011-05-23 NOTE — ED Notes (Signed)
Pt fell getting out of tub and hit left shoulder. Laid down and slept--got up and had ^ pain in left shoulder into neck.. Denies LOC.

## 2011-05-23 NOTE — ED Provider Notes (Signed)
History     CSN: [id number] Arrival date & time: 05/23/2011  1:16 AM   First MD Initiated Contact with Patient 05/23/11 0135      Chief Complaint  Patient presents with  . Fall    (Consider location/radiation/quality/duration/timing/severity/associated sxs/prior treatment) HPI Comments: Approximately 8 hours prior to evaluation patient had a fall while trying to get out of the bathtub, slipped on the wet floor striking his left shoulder during the fall. He was able to get up, ambulate without difficulty, use both oophorectomies without difficulty but noted that he had ongoing left shoulder pain. While trying to sleep tonight he noted difficulty with trying to get comfortable because of pain in the left shoulder that radiates to the left side of the neck in the posterior neck. Symptoms are constant, moderate, worse with movement of the left arm and not associated with numbness weakness or head injury or loss of consciousness.  Patient is a 60 y.o. male presenting with fall. The history is provided by the patient and the EMS personnel.  Fall    Past Medical History  Diagnosis Date  . Lymphocytosis   . Mediastinal lymphadenopathy   . Diabetes mellitus   . Hyperlipidemia   . HTN (hypertension)     Past Surgical History  Procedure Date  . Knee surgery 11-01-2010    right  . Carpal tunnel release 08-2009    right    Family History  Problem Relation Age of Onset  . Allergies Sister   . Cancer Mother     stomach  . Pancreatic cancer Father     History  Substance Use Topics  . Smoking status: Current Everyday Smoker -- 0.0 packs/day for 15 years    Types: Cigarettes  . Smokeless tobacco: Not on file  . Alcohol Use: No     quit in 2005      Review of Systems  All other systems reviewed and are negative.    Allergies  Lunesta  Home Medications   Current Outpatient Rx  Name Route Sig Dispense Refill  . ALBUTEROL SULFATE HFA 108 (90 BASE) MCG/ACT IN AERS  Inhalation Inhale 2 puffs into the lungs every 6 (six) hours as needed. For shortness of breath    . ATORVASTATIN CALCIUM 10 MG PO TABS Oral Take 10 mg by mouth daily.      Marland Kitchen HYDROCODONE-ACETAMINOPHEN 5-500 MG PO TABS Oral Take 1 tablet by mouth every 6 (six) hours as needed. For pain     . INSULIN ASPART 100 UNIT/ML [place] SOLN Subcutaneous Inject 20 Units into the skin 3 (three) times daily before meals.      . INSULIN GLARGINE 100 UNIT/ML [place] SOLN Subcutaneous Inject 30 Units into the skin at bedtime.     Marland Kitchen LEVOTHYROXINE SODIUM 50 MCG PO TABS Oral Take 50 mcg by mouth daily.      Marland Kitchen VICTOZA [place] Subcutaneous Inject 1.8 mg into the skin daily.     Marland Kitchen METFORMIN HCL 1000 MG PO TABS Oral Take 1,000 mg by mouth 2 (two) times daily with a meal.     . METOPROLOL SUCCINATE 50 MG PO TB24 Oral Take 50 mg by mouth daily.      Marland Kitchen NAPROXEN 500 MG PO TABS Oral Take 500 mg by mouth 2 (two) times daily with a meal. For pain and inflammation     . PAROXETINE HCL 40 MG PO TABS Oral Take 40 mg by mouth every morning.      . TESTOSTERONE 50  MG/5GM TD GEL Transdermal Place 5 g onto the skin daily.      . TRAZODONE HCL 50 MG PO TABS Oral Take 0.5-1 tablets by mouth at bedtime as needed. For sleep    . HYDROCODONE-ACETAMINOPHEN 5-500 MG PO TABS Oral Take 1-2 tablets by mouth every 6 (six) hours as needed for pain. 15 tablet 0  . METHOCARBAMOL 500 MG PO TABS Oral Take 1 tablet (500 mg total) by mouth 2 (two) times daily as needed. 20 tablet 0  . NAPROXEN 500 MG PO TABS Oral Take 1 tablet (500 mg total) by mouth 2 (two) times daily with a meal. 30 tablet 0    BP 129/72  Pulse 88  Temp(Src) 97.4 F (36.3 C) (Oral)  Resp 18  Ht 6\' 1"  (1.854 m)  Wt 270 lb (122.471 kg)  BMI 35.62 kg/m2  SpO2 96%  Physical Exam  Nursing note and vitals reviewed. Constitutional: He appears well-developed and well-nourished. No distress.  HENT:  Head: Normocephalic and atraumatic.  Mouth/Throat: Oropharynx is clear and moist. No  oropharyngeal exudate.  Eyes: Conjunctivae and EOM are normal. Pupils are equal, round, and reactive to light. Right eye exhibits no discharge. Left eye exhibits no discharge. No scleral icterus.  Neck: No JVD present. No thyromegaly present.       Tenderness on the left side of the neck in the paraspinal and strap muscles.  Cardiovascular: Normal rate, regular rhythm, normal heart sounds and intact distal pulses.  Exam reveals no gallop and no friction rub.   No murmur heard. Pulmonary/Chest: Effort normal and breath sounds normal. No respiratory distress. He has no wheezes. He has no rales.  Abdominal: Soft. Bowel sounds are normal. He exhibits no distension and no mass. There is no tenderness.  Musculoskeletal: Normal range of motion. He exhibits tenderness ( Tenderness to palpation over the posterior cervical spine, no other spinal tenderness). He exhibits no edema.       Tenderness to palpation over the left shoulder, no deformity of the shoulder or the clavicle. Able to lift the left arm up above his head with moderate pain in the shoulder  Lymphadenopathy:    He has no cervical adenopathy.  Neurological: He is alert. Coordination normal.  Skin: Skin is warm and dry. No rash noted. No erythema.  Psychiatric: He has a normal mood and affect. His behavior is normal.    ED Course  Procedures (including critical care time)  Labs Reviewed - No data to display Ct Cervical Spine Wo Contrast  05/23/2011  *RADIOLOGY REPORT*  Clinical Data: Neck pain status post fall.  CT CERVICAL SPINE WITHOUT CONTRAST  Technique:  Multidetector CT imaging of the cervical spine was performed. Multiplanar CT image reconstructions were also generated.  Comparison: 10/25/2010 PET-CT  Findings: Visualized intracranial contents are within normal limits.  Lung apices are predominately clear.  The craniocervical relationship is maintained.  Multilevel degenerative changes are present.  Degenerative changes are most  pronounced at C5-6 where a disc osteophyte complex results in moderate to severe central canal and bilateral neural foraminal narrowing.  Ligamentous calcifications.  No prevertebral or paravertebral soft tissue swelling. No displaced fracture or dislocation.  There is nonspecific mild loss of normal cervical lordosis.  IMPRESSION: Multilevel degenerative changes, most pronounced at C5-6 where there is moderate to severe central canal and neural foraminal narrowing.  No acute fracture or dislocation identified.  Original Report Authenticated By: Waneta Martins, M.D.   Dg Shoulder Left  05/23/2011  *RADIOLOGY REPORT*  Clinical Data: Left shoulder pain status post fall.  LEFT SHOULDER - 2+ VIEW  Comparison: 03/31/2009 radiograph, 05/05/2009 CT  Findings: The left humeral head is high-riding, in keeping with underlying rotator cuff pathology.  There are moderate acromioclavicular and glenohumeral joint degenerative changes. Cystic changes noted in the distal clavicle and greater tuberosity. No acute fracture or dislocation identified.  Left upper lung is clear.  No displaced left upper rib fracture.  IMPRESSION: Left glenohumeral and acromioclavicular joint DJD.  High-riding left humeral head is in keeping with underlying rotator cuff pathology.  No acute fracture or dislocation identified.  Original Report Authenticated By: Waneta Martins, M.D.     1. Neck strain   2. Contusion of left shoulder       MDM  Injury to the left shoulder and possibly the neck, x-rays ordered, pain medication ordered, EMS cervical collar replaced with a [place] collar on arrival. No focal neurologic deficits, no head injury, no overt signs of trauma on physical exam     CT and xray negative, VS normal, no neur defecits, home with Nsaids and robaxin.  Vida Roller, MD 05/23/11 [phone]

## 2011-05-23 NOTE — ED Notes (Signed)
Collared and boarded by EMS.

## 2011-06-09 ENCOUNTER — Encounter (HOSPITAL_COMMUNITY): Payer: Self-pay | Admitting: *Deleted

## 2011-06-09 ENCOUNTER — Emergency Department (HOSPITAL_COMMUNITY)
Admission: EM | Admit: 2011-06-09 | Discharge: 2011-06-10 | Disposition: A | Discharge status: Home / Self Care | Payer: Medicare Other | Attending: Emergency Medicine | Admitting: Emergency Medicine

## 2011-06-09 DIAGNOSIS — E785 Hyperlipidemia, unspecified: Secondary | ICD-10-CM | POA: Insufficient documentation

## 2011-06-09 DIAGNOSIS — F17200 Nicotine dependence, unspecified, uncomplicated: Secondary | ICD-10-CM | POA: Insufficient documentation

## 2011-06-09 DIAGNOSIS — M79609 Pain in unspecified limb: Secondary | ICD-10-CM | POA: Insufficient documentation

## 2011-06-09 DIAGNOSIS — M542 Cervicalgia: Secondary | ICD-10-CM | POA: Insufficient documentation

## 2011-06-09 DIAGNOSIS — I10 Essential (primary) hypertension: Secondary | ICD-10-CM | POA: Insufficient documentation

## 2011-06-09 DIAGNOSIS — Z9889 Other specified postprocedural states: Secondary | ICD-10-CM | POA: Insufficient documentation

## 2011-06-09 DIAGNOSIS — R209 Unspecified disturbances of skin sensation: Secondary | ICD-10-CM | POA: Insufficient documentation

## 2011-06-09 DIAGNOSIS — R05 Cough: Secondary | ICD-10-CM | POA: Insufficient documentation

## 2011-06-09 DIAGNOSIS — M5412 Radiculopathy, cervical region: Secondary | ICD-10-CM | POA: Insufficient documentation

## 2011-06-09 DIAGNOSIS — J209 Acute bronchitis, unspecified: Secondary | ICD-10-CM | POA: Insufficient documentation

## 2011-06-09 DIAGNOSIS — R059 Cough, unspecified: Secondary | ICD-10-CM | POA: Insufficient documentation

## 2011-06-09 DIAGNOSIS — R062 Wheezing: Secondary | ICD-10-CM | POA: Insufficient documentation

## 2011-06-09 DIAGNOSIS — E119 Type 2 diabetes mellitus without complications: Secondary | ICD-10-CM | POA: Insufficient documentation

## 2011-06-09 NOTE — ED Notes (Signed)
Pt sitting in chair; no signs of distress; moving neck in all directions.

## 2011-06-09 NOTE — ED Notes (Signed)
Patient with neck pain for about three weeks on the left side.  Patient also c/o cough and blurred vision

## 2011-06-10 MED ORDER — OXYCODONE-ACETAMINOPHEN 5-325 MG PO TABS: 2.0000 | ORAL_TABLET | ORAL | Status: AC | PRN

## 2011-06-10 MED ORDER — DOXYCYCLINE HYCLATE 100 MG PO CAPS: 100.0000 mg | ORAL_CAPSULE | Freq: Two times a day (BID) | ORAL | Status: AC

## 2011-06-10 MED ORDER — PREDNISONE 20 MG PO TABS: ORAL_TABLET | ORAL | Status: AC

## 2011-06-10 MED ORDER — HYDROMORPHONE HCL PF 1 MG/ML IJ SOLN
1.0000 mg | Freq: Once | INTRAMUSCULAR | Status: AC
  Administered 2011-06-10: 1 mg via INTRAMUSCULAR
  Filled 2011-06-10: qty 1

## 2011-06-10 NOTE — ED Notes (Signed)
Pt asking when he will see the doctor; pt yelling and confrontational. RN explained delay and provided pillow and blanket.

## 2011-06-10 NOTE — ED Notes (Signed)
Pt reports spasmatic cough and every time he coughs he feels pain in jaw and neck. Pt reports he thinks he has bronchitis. Pt smokes 1 pack a day. Pt reports he does not have any phlegm.

## 2011-06-10 NOTE — ED Notes (Signed)
PT ambulated with a steady gait; VSS; A&Ox3; no signs of distress; no questions at this time.

## 2011-06-10 NOTE — ED Provider Notes (Signed)
History     CSN: [id number] Arrival date & time: 06/09/2011  8:56 PM   First MD Initiated Contact with Patient 06/10/11 0044      Chief Complaint  Patient presents with  . Neck Pain    (Consider location/radiation/quality/duration/timing/severity/associated sxs/prior treatment) HPI This 60 year old male fell a few weeks ago and was seen in the emergency department with a CT scan of his neck showing some degenerative changes, the patient has had persistent left-sided neck pain with some radiation towards his left arm with some numbness to the ulnar aspect of his left hand including his small finger and ring finger without weakness or change in bowel or bladder control. He has no chest pain shortness of breath abdominal pain vomiting or fevers but over the last few days his chronic cough is worse which is nonproductive. He also has some mild wheezing for the last few days he does have an inhaler at home already. Whenever he coughs his neck pain feels worse and he ran out of his pain medicine from the emergency department has not followed up with his doctor yet for his neck pain. He has not had any weakness or numbness of his right arm or his legs. Past Medical History  Diagnosis Date  . Lymphocytosis   . Mediastinal lymphadenopathy   . Diabetes mellitus   . Hyperlipidemia   . HTN (hypertension)     Past Surgical History  Procedure Date  . Knee surgery 11-01-2010    right  . Carpal tunnel release 08-2009    right    Family History  Problem Relation Age of Onset  . Allergies Sister   . Cancer Mother     stomach  . Pancreatic cancer Father     History  Substance Use Topics  . Smoking status: Current Everyday Smoker -- 0.0 packs/day for 15 years    Types: Cigarettes  . Smokeless tobacco: Not on file  . Alcohol Use: No     quit in 2005      Review of Systems  Constitutional: Negative for fever.       10 Systems reviewed and are negative for acute change except as noted in  the HPI.  HENT: Positive for neck pain. Negative for congestion.   Eyes: Negative for discharge and redness.  Respiratory: Positive for cough. Negative for shortness of breath.   Cardiovascular: Negative for chest pain.  Gastrointestinal: Negative for vomiting and abdominal pain.  Musculoskeletal: Negative for back pain.  Skin: Negative for rash.  Neurological: Positive for numbness. Negative for syncope, weakness and headaches.  Psychiatric/Behavioral:       No behavior change.    Allergies  Lunesta  Home Medications   Current Outpatient Rx  Name Route Sig Dispense Refill  . ALBUTEROL SULFATE HFA 108 (90 BASE) MCG/ACT IN AERS Inhalation Inhale 2 puffs into the lungs every 6 (six) hours as needed. For shortness of breath    . ATORVASTATIN CALCIUM 10 MG PO TABS Oral Take 10 mg by mouth daily.      . INSULIN ASPART 100 UNIT/ML [place] SOLN Subcutaneous Inject 20 Units into the skin 3 (three) times daily before meals.      . INSULIN GLARGINE 100 UNIT/ML [place] SOLN Subcutaneous Inject 30 Units into the skin at bedtime.     Marland Kitchen LEVOTHYROXINE SODIUM 50 MCG PO TABS Oral Take 50 mcg by mouth daily.      Marland Kitchen VICTOZA [place] Subcutaneous Inject 1.8 mg into the skin daily.     Marland Kitchen  METFORMIN HCL 1000 MG PO TABS Oral Take 1,000 mg by mouth 2 (two) times daily with a meal.     . METOPROLOL SUCCINATE ER 50 MG PO TB24 Oral Take 50 mg by mouth daily.      Marland Kitchen NAPROXEN 500 MG PO TABS Oral Take 500 mg by mouth 2 (two) times daily with a meal.      . PAROXETINE HCL 40 MG PO TABS Oral Take 40 mg by mouth every morning.      . TESTOSTERONE 50 MG/5GM TD GEL Transdermal Place 5 g onto the skin daily.      . TRAZODONE HCL 50 MG PO TABS Oral Take 0.5-1 tablets by mouth at bedtime as needed. For sleep    . DOXYCYCLINE HYCLATE 100 MG PO CAPS Oral Take 1 capsule (100 mg total) by mouth 2 (two) times daily. One po bid x 7 days 14 capsule 0  . OXYCODONE-ACETAMINOPHEN 5-325 MG PO TABS Oral Take 2 tablets by mouth every 4 (four) hours  as needed for pain. 20 tablet 0  . PREDNISONE 20 MG PO TABS  3 tabs po day one, then 2 po daily x 4 days 11 tablet 0    BP 138/100  Pulse 98  Temp(Src) 98.1 F (36.7 C) (Oral)  Resp 20  SpO2 97%  Physical Exam  Nursing note and vitals reviewed. Constitutional:       Awake, alert, nontoxic appearance.  HENT:  Head: Atraumatic.  Eyes: Right eye exhibits no discharge. Left eye exhibits no discharge.  Neck: Neck supple.       Neck has mild posterior midline as well as left paracervical and left sternocleidomastoid and left trapezius muscle soft tissue region tenderness  Cardiovascular: Normal rate and regular rhythm.   No murmur heard. Pulmonary/Chest: Effort normal. No respiratory distress. He has wheezes. He has no rales. He exhibits no tenderness.       Mild end expiratory wheezes which are currently asymptomatic  Abdominal: Soft. There is no tenderness. There is no rebound.  Musculoskeletal: He exhibits no edema and no tenderness.       Baseline ROM, no obvious new focal weakness.  The patient's left hand has slight numbness to his small and ring fingers and ulnar aspect of the hand with normal light touch to the rest of his hand including the distributions of the median, radial, and axillary nerve regions on the left arm. He has 5 out of 5 motor strength testing to the left arm including the axillary radial median and ulnar nerve regions. His right arm and both legs have normal light touch with no obvious weakness according to the patient.  Neurological: He is alert.       Mental status and motor strength appears baseline for patient and situation.  Skin: No rash noted.  Psychiatric: He has a normal mood and affect.    ED Course  Procedures (including critical care time) I suspect the patient may have a cervical radiculopathy but does not suggest the need for emergency MRI or admission at this time. He is aware he may need an MRI scan of his neck and followup. I suspect he has a  mild COPD exacerbation but the patient states even though he still smokes and has an inhaler and has wheezing he was told he does not really have COPD, but does get recurrent bronchitis and has chronic bronchitis. Labs Reviewed - No data to display No results found.   1. Cervical radiculopathy   2.  Bronchospasm with bronchitis, acute       MDM          Hurman Horn, MD 06/10/11 [phone]

## 2011-07-01 ENCOUNTER — Emergency Department (HOSPITAL_COMMUNITY)
Admission: EM | Admit: 2011-07-01 | Discharge: 2011-07-01 | Disposition: A | Discharge status: Home / Self Care | Payer: Medicare Other | Attending: Emergency Medicine | Admitting: Emergency Medicine

## 2011-07-01 ENCOUNTER — Encounter (HOSPITAL_COMMUNITY): Payer: Self-pay

## 2011-07-01 DIAGNOSIS — L0889 Other specified local infections of the skin and subcutaneous tissue: Secondary | ICD-10-CM | POA: Insufficient documentation

## 2011-07-01 DIAGNOSIS — IMO0002 Reserved for concepts with insufficient information to code with codable children: Secondary | ICD-10-CM

## 2011-07-01 DIAGNOSIS — E785 Hyperlipidemia, unspecified: Secondary | ICD-10-CM | POA: Insufficient documentation

## 2011-07-01 DIAGNOSIS — L723 Sebaceous cyst: Secondary | ICD-10-CM | POA: Insufficient documentation

## 2011-07-01 DIAGNOSIS — E119 Type 2 diabetes mellitus without complications: Secondary | ICD-10-CM | POA: Insufficient documentation

## 2011-07-01 DIAGNOSIS — F17200 Nicotine dependence, unspecified, uncomplicated: Secondary | ICD-10-CM | POA: Insufficient documentation

## 2011-07-01 DIAGNOSIS — Z794 Long term (current) use of insulin: Secondary | ICD-10-CM | POA: Insufficient documentation

## 2011-07-01 DIAGNOSIS — I10 Essential (primary) hypertension: Secondary | ICD-10-CM | POA: Insufficient documentation

## 2011-07-01 MED ORDER — DOXYCYCLINE HYCLATE 100 MG PO CAPS: 100.0000 mg | ORAL_CAPSULE | Freq: Two times a day (BID) | ORAL | Status: DC

## 2011-07-01 MED ORDER — HYDROCODONE-ACETAMINOPHEN 5-325 MG PO TABS: ORAL_TABLET | ORAL | Status: AC

## 2011-07-01 MED ORDER — HYDROCODONE-ACETAMINOPHEN 5-325 MG PO TABS
1.0000 | ORAL_TABLET | Freq: Once | ORAL | Status: AC
  Administered 2011-07-01: 1 via ORAL
  Filled 2011-07-01: qty 1

## 2011-07-01 NOTE — ED Notes (Signed)
Registration at bedside.

## 2011-07-01 NOTE — ED Notes (Signed)
Patient complaining of cyst on his left leg; states that he has had cysts like this in the past.  Patient states that he used to get the cysts quite often, but have not had a problem for a long while.  Patient states that this cyst began two days ago and became progressively worse las night.  Patient tried to pop it today, but states that he was unable to get anything to come out.  Upon assessment, cyst located on patient's left outer thigh.  Patient complaining of pain at the site; rates pain 8/10 on the numerical pain scale; describes pain as "burning", "sharp", and "throbbing".  Patient states that treatment in the past includes lancing of the cysts; patient requesting medication administration before lancing of cyst.  Patient alert and oriented x4; PERRL present.  Will continue to monitor.

## 2011-07-01 NOTE — ED Notes (Signed)
Pt reports that he fells "a cyst on the back of my left leg".  Pt reports that he attempted to squeeze it today, but nothing came out.

## 2011-07-01 NOTE — ED Provider Notes (Cosign Needed)
History     CSN: [id number]  Arrival date & time 07/01/11  1723   First MD Initiated Contact with Patient 07/01/11 2001      Chief Complaint  Patient presents with  . Recurrent Skin Infections    Boil    (Consider location/radiation/quality/duration/timing/severity/associated sxs/prior treatment) HPI Comments: Patient with history of recurrent left thigh abscess, presents with recurrent abscess/cyst for 2 days. Patient tried to drain at home but nothing came out. Patient states these have been drained in the past. Patient denies fever. Patient takes chronic pain medicine for knee.  Patient is a 60 y.o. male presenting with abscess. The history is provided by the patient.  Abscess  This is a recurrent problem. The problem has been unchanged. The abscess is present on the left upper leg. The problem is mild. The abscess is characterized by swelling and painfulness. Pertinent negatives include no fever and no vomiting.    Past Medical History  Diagnosis Date  . Lymphocytosis   . Mediastinal lymphadenopathy   . Diabetes mellitus   . Hyperlipidemia   . HTN (hypertension)     Past Surgical History  Procedure Date  . Knee surgery 11-01-2010    right  . Carpal tunnel release 08-2009    right    Family History  Problem Relation Age of Onset  . Allergies Sister   . Cancer Mother     stomach  . Pancreatic cancer Father     History  Substance Use Topics  . Smoking status: Current Everyday Smoker -- 0.5 packs/day for 15 years    Types: Cigarettes  . Smokeless tobacco: Not on file  . Alcohol Use: No     quit in 2005      Review of Systems  Constitutional: Negative for fever and chills.  Gastrointestinal: Negative for nausea, vomiting and abdominal pain.  Musculoskeletal: Negative for myalgias.  Skin: Negative for rash and wound.       Positive for abscess of posterior left thigh  Neurological: Negative for numbness.  Hematological: Negative for adenopathy.     Allergies  Lunesta  Home Medications   Current Outpatient Rx  Name Route Sig Dispense Refill  . ALBUTEROL SULFATE HFA 108 (90 BASE) MCG/ACT IN AERS Inhalation Inhale 2 puffs into the lungs every 6 (six) hours as needed. For shortness of breath    . ATORVASTATIN CALCIUM 10 MG PO TABS Oral Take 10 mg by mouth daily.      . INSULIN ASPART 100 UNIT/ML [place] SOLN Subcutaneous Inject 20 Units into the skin 3 (three) times daily before meals.      . INSULIN GLARGINE 100 UNIT/ML [place] SOLN Subcutaneous Inject 30 Units into the skin at bedtime.     Marland Kitchen LEVOTHYROXINE SODIUM 50 MCG PO TABS Oral Take 50 mcg by mouth daily.      Marland Kitchen METFORMIN HCL 1000 MG PO TABS Oral Take 1,000 mg by mouth 2 (two) times daily with a meal.     . METOPROLOL SUCCINATE ER 50 MG PO TB24 Oral Take 50 mg by mouth daily.      Marland Kitchen NAPROXEN 500 MG PO TABS Oral Take 500 mg by mouth 2 (two) times daily with a meal.      . PAROXETINE HCL 40 MG PO TABS Oral Take 40 mg by mouth every morning.      . TESTOSTERONE 50 MG/5GM TD GEL Transdermal Place 5 g onto the skin daily.      . TRAZODONE HCL 50 MG PO  TABS Oral Take 0.5-1 tablets by mouth at bedtime as needed. For sleep      BP 133/91  Pulse 94  Temp(Src) 98.2 F (36.8 C) (Oral)  Resp 20  SpO2 98%  Physical Exam  Nursing note and vitals reviewed. Constitutional: He is oriented to person, place, and time. He appears well-developed and well-nourished.  HENT:  Head: Normocephalic and atraumatic.  Eyes: Conjunctivae are normal. Pupils are equal, round, and reactive to light. Right eye exhibits no discharge. Left eye exhibits no discharge.  Neck: Normal range of motion. Neck supple.  Pulmonary/Chest: Effort normal.  Musculoskeletal: He exhibits no edema.       Patient with small, raised, tender, indurated area approximately 2 cm in diameter to posterior left thigh consistent with cyst. There is no overlying redness or surrounding erythema consistent with cellulitis.  Neurological: He  is alert and oriented to person, place, and time.  Skin: Skin is warm and dry.  Psychiatric: He has a normal mood and affect.    ED Course  Procedures (including critical care time)  Labs Reviewed - No data to display No results found.   1. Cyst     8:35 PM patient seen and examined. The area is not amenable to incision and drainage at this time. Will prescribe pain medicine and doxycycline. Patient informed that if area gets bigger he will need to be reevaluated for possible I&D. Patient verbalizes understanding and agrees with plan.  8:36 PM Patient counseled on use of narcotic pain medications. Counseled not to combine these medications with others containing tylenol. Urged not to drink alcohol, drive, or perform any other activities that requires focus while taking these medications. The patient verbalizes understanding and agrees with the plan.   MDM  Patient with cyst on leg. Will treat with antibiotics. The area is not fluctuant. There is no surrounding cellulitis.   Medical screening examination/treatment/procedure(s) were performed by non-physician practitioner and as supervising physician I was immediately available for consultation/collaboration. Osvaldo Human, M.D.      Eustace Moore [place], [place] 07/01/11 2039  Carleene Anna III, MD 07/02/11 [phone]

## 2011-07-07 ENCOUNTER — Encounter (HOSPITAL_COMMUNITY): Payer: Self-pay | Admitting: Emergency Medicine

## 2011-07-07 ENCOUNTER — Emergency Department (HOSPITAL_COMMUNITY)
Admission: EM | Admit: 2011-07-07 | Discharge: 2011-07-07 | Disposition: A | Discharge status: Home / Self Care | Payer: Medicare Other | Attending: Emergency Medicine | Admitting: Emergency Medicine

## 2011-07-07 DIAGNOSIS — E785 Hyperlipidemia, unspecified: Secondary | ICD-10-CM | POA: Insufficient documentation

## 2011-07-07 DIAGNOSIS — M549 Dorsalgia, unspecified: Secondary | ICD-10-CM | POA: Insufficient documentation

## 2011-07-07 DIAGNOSIS — L0231 Cutaneous abscess of buttock: Secondary | ICD-10-CM | POA: Insufficient documentation

## 2011-07-07 DIAGNOSIS — L02212 Cutaneous abscess of back [any part, except buttock]: Secondary | ICD-10-CM

## 2011-07-07 DIAGNOSIS — L02219 Cutaneous abscess of trunk, unspecified: Secondary | ICD-10-CM | POA: Insufficient documentation

## 2011-07-07 DIAGNOSIS — R6883 Chills (without fever): Secondary | ICD-10-CM | POA: Insufficient documentation

## 2011-07-07 DIAGNOSIS — F17200 Nicotine dependence, unspecified, uncomplicated: Secondary | ICD-10-CM | POA: Insufficient documentation

## 2011-07-07 DIAGNOSIS — I10 Essential (primary) hypertension: Secondary | ICD-10-CM | POA: Insufficient documentation

## 2011-07-07 DIAGNOSIS — E119 Type 2 diabetes mellitus without complications: Secondary | ICD-10-CM | POA: Insufficient documentation

## 2011-07-07 DIAGNOSIS — L03319 Cellulitis of trunk, unspecified: Secondary | ICD-10-CM | POA: Insufficient documentation

## 2011-07-07 DIAGNOSIS — IMO0001 Reserved for inherently not codable concepts without codable children: Secondary | ICD-10-CM | POA: Insufficient documentation

## 2011-07-07 DIAGNOSIS — L03317 Cellulitis of buttock: Secondary | ICD-10-CM | POA: Insufficient documentation

## 2011-07-07 DIAGNOSIS — Z79899 Other long term (current) drug therapy: Secondary | ICD-10-CM | POA: Insufficient documentation

## 2011-07-07 DIAGNOSIS — Z794 Long term (current) use of insulin: Secondary | ICD-10-CM | POA: Insufficient documentation

## 2011-07-07 MED ORDER — CEPHALEXIN 500 MG PO CAPS: 500.0000 mg | ORAL_CAPSULE | Freq: Four times a day (QID) | ORAL | Status: DC

## 2011-07-07 MED ORDER — CEPHALEXIN 500 MG PO CAPS: 500.0000 mg | ORAL_CAPSULE | Freq: Four times a day (QID) | ORAL | Status: AC

## 2011-07-07 MED ORDER — DOXYCYCLINE HYCLATE 100 MG PO CAPS: 100.0000 mg | ORAL_CAPSULE | Freq: Two times a day (BID) | ORAL | Status: AC

## 2011-07-07 MED ORDER — HYDROCODONE-ACETAMINOPHEN 5-325 MG PO TABS
2.0000 | ORAL_TABLET | Freq: Once | ORAL | Status: AC
  Administered 2011-07-07: 2 via ORAL
  Filled 2011-07-07: qty 2

## 2011-07-07 NOTE — ED Notes (Signed)
PT. REPORTS ABSCESS AT RIGHT UPPER BACK AND RIGHT BUTTOCKS FOR 3 DAYS WITH DRAINAGE.

## 2011-07-07 NOTE — ED Notes (Signed)
Pt given a gown.

## 2011-07-07 NOTE — ED Provider Notes (Signed)
History     CSN: [id number]  Arrival date & time 07/07/11  0865   First MD Initiated Contact with Patient 07/07/11 [phone]      Chief Complaint  Patient presents with  . Abscess    (Consider location/radiation/quality/duration/timing/severity/associated sxs/prior treatment) Patient is a 61 y.o. male presenting with abscess. The history is provided by the patient.  Abscess  This is a recurrent problem. The current episode started less than one week ago. The onset was sudden. The problem occurs occasionally. The problem has been gradually worsening. Affected Location: right buttock, right upperback. The problem is moderate. The abscess is characterized by redness, painfulness and draining. The abscess first occurred at home. Pertinent negatives include no fever. Recent Medical Care: Seen in ED recently, abx given for cellulitis with no drainable abscess on12/30./2012.    Past Medical History  Diagnosis Date  . Lymphocytosis   . Mediastinal lymphadenopathy   . Diabetes mellitus   . Hyperlipidemia   . HTN (hypertension)     Past Surgical History  Procedure Date  . Knee surgery 11-01-2010    right  . Carpal tunnel release 08-2009    right    Family History  Problem Relation Age of Onset  . Allergies Sister   . Cancer Mother     stomach  . Pancreatic cancer Father     History  Substance Use Topics  . Smoking status: Current Everyday Smoker -- 0.5 packs/day for 15 years    Types: Cigarettes  . Smokeless tobacco: Not on file  . Alcohol Use: No     quit in 2005      Review of Systems  Constitutional: Positive for chills. Negative for fever.  Respiratory: Negative for shortness of breath.   Cardiovascular: Negative for chest pain.  Gastrointestinal: Negative for abdominal pain.  Skin: Positive for wound. Negative for rash.  Neurological: Negative for syncope.    Allergies  Lunesta  Home Medications   Current Outpatient Rx  Name Route Sig Dispense Refill  .  ALBUTEROL SULFATE HFA 108 (90 BASE) MCG/ACT IN AERS Inhalation Inhale 2 puffs into the lungs every 6 (six) hours as needed. For shortness of breath    . ATORVASTATIN CALCIUM 10 MG PO TABS Oral Take 10 mg by mouth daily.      Marland Kitchen DOXYCYCLINE HYCLATE 100 MG PO CAPS Oral Take 1 capsule (100 mg total) by mouth 2 (two) times daily. 14 capsule 0  . HYDROCODONE-ACETAMINOPHEN 5-325 MG PO TABS  Take 1-2 tablets every 6 hours as needed for severe pain 7 tablet 0  . INSULIN ASPART 100 UNIT/ML [place] SOLN Subcutaneous Inject 20 Units into the skin 3 (three) times daily before meals.      . INSULIN GLARGINE 100 UNIT/ML [place] SOLN Subcutaneous Inject 30 Units into the skin at bedtime.     Marland Kitchen LEVOTHYROXINE SODIUM 50 MCG PO TABS Oral Take 50 mcg by mouth daily.      Marland Kitchen METOPROLOL SUCCINATE ER 50 MG PO TB24 Oral Take 50 mg by mouth daily.      Marland Kitchen NAPROXEN 500 MG PO TABS Oral Take 500 mg by mouth 2 (two) times daily with a meal.      . PAROXETINE HCL 40 MG PO TABS Oral Take 40 mg by mouth every morning.      . TESTOSTERONE 50 MG/5GM TD GEL Transdermal Place 5 g onto the skin daily.      . TRAZODONE HCL 50 MG PO TABS Oral Take 0.5-1 tablets by  mouth at bedtime as needed. For sleep    . METFORMIN HCL 1000 MG PO TABS Oral Take 1,000 mg by mouth 2 (two) times daily with a meal.       BP 127/78  Pulse 80  Temp(Src) 97.6 F (36.4 C) (Oral)  Resp 18  SpO2 96%  Physical Exam  Nursing note and vitals reviewed. Constitutional: He is oriented to person, place, and time. He appears well-developed and well-nourished.       Uncomfortable appearing  HENT:  Head: Normocephalic and atraumatic.  Neck: Normal range of motion.  Cardiovascular: Normal rate and regular rhythm.   Pulmonary/Chest: Effort normal. No respiratory distress.  Abdominal: Soft. He exhibits no distension. There is no tenderness.  Musculoskeletal: He exhibits no edema and no tenderness.  Neurological: He is alert and oriented to person, place, and time.  Skin:         Generalized scarring to trunk, likely secondary to chronic cysts/skin infections. Abscess to right upperback, 1cm in diameter with central fluctuance, minimal surrounding erythema, no evidence of cellulitis. Area of erythema c/w cellulitis to right buttock, 5cm in diameter, small pustule to center with purulent drainage.  Psychiatric: His behavior is normal.    ED Course  Procedures (including critical care time)  Labs Reviewed - No data to display No results found.   INCISION AND DRAINAGE Performed by: Lorenz Coaster Consent: Verbal consent obtained. Risks and benefits: risks, benefits and alternatives were discussed Type: abscess  Body area: right upper back  Anesthesia: local infiltration  Local anesthetic: lidocaine 1% with epinephrine  Anesthetic total: 1 ml  Complexity: complex Blunt dissection to break up loculations  Drainage: purulent  Drainage amount: Moderate  Packing material: 1/4 in iodoform gauze  Patient tolerance: Patient tolerated the procedure well with no immediate complications.   INCISION AND DRAINAGE Performed by: Lorenz Coaster Consent: Verbal consent obtained. Risks and benefits: risks, benefits and alternatives were discussed Type: abscess  Body area: right buttock  Anesthesia: local infiltration  Local anesthetic: lidocaine 2% with epinephrine  Anesthetic total: 1 ml  Complexity: complex Blunt dissection to break up loculations  Drainage: purulent  Drainage amount: small  Packing material: no packing given small size of abscess pocket  Patient tolerance: Patient tolerated the procedure well with no immediate complications.   '    Dx 1: Abscess of back Dx 2: Abscess and cellulitis of right buttock   MDM  Abscess x2, both drained. Cellulitis 2 abscess on right buttock. Patient has been taking doxycycline for the last 7 days with reported resolution of prior wound. Will have pt continue doxycycline and add  keflex for strep coverage. Pt instructed to follow-up with PCP in 2 days. Multiple recent narcotic rx, pt to take home meds for pain as needed.        [address] [place], [place] 07/07/11 [phone]

## 2011-07-07 NOTE — ED Provider Notes (Signed)
Medical screening examination/treatment/procedure(s) were performed by non-physician practitioner and as supervising physician I was immediately available for consultation/collaboration.   Everleigh Colclasure, MD 07/07/11 1341 

## 2011-07-28 ENCOUNTER — Encounter (HOSPITAL_COMMUNITY): Payer: Self-pay | Admitting: *Deleted

## 2011-07-28 ENCOUNTER — Emergency Department (HOSPITAL_COMMUNITY)
Admission: EM | Admit: 2011-07-28 | Discharge: 2011-07-28 | Disposition: A | Discharge status: Home / Self Care | Payer: Medicare Other | Attending: Emergency Medicine | Admitting: Emergency Medicine

## 2011-07-28 DIAGNOSIS — L0291 Cutaneous abscess, unspecified: Secondary | ICD-10-CM

## 2011-07-28 DIAGNOSIS — I10 Essential (primary) hypertension: Secondary | ICD-10-CM | POA: Insufficient documentation

## 2011-07-28 DIAGNOSIS — Z79899 Other long term (current) drug therapy: Secondary | ICD-10-CM | POA: Insufficient documentation

## 2011-07-28 DIAGNOSIS — E785 Hyperlipidemia, unspecified: Secondary | ICD-10-CM | POA: Insufficient documentation

## 2011-07-28 DIAGNOSIS — L02219 Cutaneous abscess of trunk, unspecified: Secondary | ICD-10-CM | POA: Insufficient documentation

## 2011-07-28 DIAGNOSIS — Z794 Long term (current) use of insulin: Secondary | ICD-10-CM | POA: Insufficient documentation

## 2011-07-28 DIAGNOSIS — L03319 Cellulitis of trunk, unspecified: Secondary | ICD-10-CM | POA: Insufficient documentation

## 2011-07-28 DIAGNOSIS — R1032 Left lower quadrant pain: Secondary | ICD-10-CM | POA: Insufficient documentation

## 2011-07-28 DIAGNOSIS — E119 Type 2 diabetes mellitus without complications: Secondary | ICD-10-CM | POA: Insufficient documentation

## 2011-07-28 DIAGNOSIS — L03119 Cellulitis of unspecified part of limb: Secondary | ICD-10-CM | POA: Insufficient documentation

## 2011-07-28 DIAGNOSIS — L02419 Cutaneous abscess of limb, unspecified: Secondary | ICD-10-CM | POA: Insufficient documentation

## 2011-07-28 MED ORDER — OXYCODONE-ACETAMINOPHEN 5-325 MG PO TABS
1.0000 | ORAL_TABLET | Freq: Once | ORAL | Status: AC
  Administered 2011-07-28: 1 via ORAL
  Filled 2011-07-28: qty 1

## 2011-07-28 MED ORDER — DOXYCYCLINE HYCLATE 100 MG PO CAPS: 100.0000 mg | ORAL_CAPSULE | Freq: Two times a day (BID) | ORAL | Status: AC

## 2011-07-28 NOTE — ED Provider Notes (Signed)
History     CSN: [id number]  Arrival date & time 07/28/11  1946   First MD Initiated Contact with Patient 07/28/11 2131      Chief Complaint  Patient presents with  . Recurrent Skin Infections    (Consider location/radiation/quality/duration/timing/severity/associated sxs/prior treatment) HPI Comments: Abscess: Patient presents for evaluation of a cutaneous abscess. Lesion is located in the left lower abdomen and left upper thigh. Onset was 3 weeks ago. Symptoms have gradually worsened. Abscess has associated symptoms of pain. Patient does have previous history of cutaneous abscesses. Patient does have diabetes.   Patient is a 61 y.o. male presenting with abscess. The history is provided by the patient.  Abscess  This is a recurrent problem. Episode onset: 3 weeks. The problem has been gradually worsening. The abscess is characterized by redness and painfulness. Pertinent negatives include no fever and no vomiting.    Past Medical History  Diagnosis Date  . Lymphocytosis   . Mediastinal lymphadenopathy   . Diabetes mellitus   . Hyperlipidemia   . HTN (hypertension)     Past Surgical History  Procedure Date  . Knee surgery 11-01-2010    right  . Carpal tunnel release 08-2009    right    Family History  Problem Relation Age of Onset  . Allergies Sister   . Cancer Mother     stomach  . Pancreatic cancer Father     History  Substance Use Topics  . Smoking status: Current Everyday Smoker -- 0.5 packs/day for 15 years    Types: Cigarettes  . Smokeless tobacco: Not on file  . Alcohol Use: No     quit in 2005      Review of Systems  Constitutional: Negative for fever and chills.  Gastrointestinal: Negative for nausea and vomiting.  Musculoskeletal: Negative for gait problem.  Skin: Positive for color change.  Neurological: Negative for dizziness and light-headedness.    Allergies  Lunesta  Home Medications   Current Outpatient Rx  Name Route Sig Dispense  Refill  . ALBUTEROL SULFATE HFA 108 (90 BASE) MCG/ACT IN AERS Inhalation Inhale 2 puffs into the lungs every 6 (six) hours as needed. For shortness of breath    . ATORVASTATIN CALCIUM 10 MG PO TABS Oral Take 10 mg by mouth daily.      . INSULIN ASPART 100 UNIT/ML [place] SOLN Subcutaneous Inject 20 Units into the skin 3 (three) times daily before meals.      . INSULIN GLARGINE 100 UNIT/ML [place] SOLN Subcutaneous Inject 30 Units into the skin at bedtime.     Marland Kitchen LEVOTHYROXINE SODIUM 50 MCG PO TABS Oral Take 50 mcg by mouth daily.      Marland Kitchen METFORMIN HCL 1000 MG PO TABS Oral Take 1,000 mg by mouth 2 (two) times daily with a meal.     . METOPROLOL SUCCINATE ER 50 MG PO TB24 Oral Take 50 mg by mouth daily.      Marland Kitchen NAPROXEN 500 MG PO TABS Oral Take 500 mg by mouth 2 (two) times daily with a meal.      . PAROXETINE HCL 40 MG PO TABS Oral Take 40 mg by mouth every morning.      . TESTOSTERONE 50 MG/5GM TD GEL Transdermal Place 5 g onto the skin daily.      . TRAZODONE HCL 50 MG PO TABS Oral Take 0.5-1 tablets by mouth at bedtime as needed. For sleep      BP 126/75  Pulse 87  Temp(Src)  97.9 F (36.6 C) (Oral)  Resp 18  SpO2 98%  Physical Exam  Nursing note and vitals reviewed. Constitutional: He is oriented to person, place, and time. He appears well-developed and well-nourished. He does not have a sickly appearance. He does not appear ill. No distress.  HENT:  Head: Normocephalic and atraumatic.  Eyes: Conjunctivae and EOM are normal.  Neck: Normal range of motion. Neck supple.  Cardiovascular: Normal rate and regular rhythm.   Pulmonary/Chest: Effort normal and breath sounds normal.  Musculoskeletal: He exhibits no edema.  Lymphadenopathy:       Head (right side): No submental, no preauricular and no posterior auricular adenopathy present.       Head (left side): No submental, no submandibular, no preauricular and no posterior auricular adenopathy present.    He has no axillary adenopathy.    Neurological: He is alert and oriented to person, place, and time.  Skin: Skin is warm and dry. No rash noted. He is not diaphoretic.       3cm sized abscess located on left lower abdomen. Extreme tenderness to palpation. Not currently draining. Abscess is fluctuant with mild surrounding erythema.  No surrounding warmth or induration.  2 cm sized abscess located on left lateral upper thigh. Extreme tenderness to palpation. Not currently draining. Abscess is fluctuant without warmth, surrounding erythema, or induration.    ED Course  Procedures (including critical care time)  Labs Reviewed - No data to display No results found.   No diagnosis found.  INCISION AND DRAINAGE Performed by: Anne Shutter, Shervin Cypert Consent: Verbal consent obtained. Risks and benefits: risks, benefits and alternatives were discussed Type: abscess  Body area: left lower abdomen  Anesthesia: local infiltration  Local anesthetic: lidocaine 2% with epinephrine  Anesthetic total: 2 ml  Complexity: complex Blunt dissection to break up loculations  Drainage: purulent  Drainage amount: moderate  Patient tolerance: Patient tolerated the procedure well with no immediate complications.  INCISION AND DRAINAGE Performed by: Anne Shutter, Resa Rinks Consent: Verbal consent obtained. Risks and benefits: risks, benefits and alternatives were discussed Type: abscess  Body area: left lateral upper thigh  Anesthesia: local infiltration  Local anesthetic: lidocaine 2% with epinephrine  Anesthetic total: 2 ml  Complexity: complex Blunt dissection to break up loculations  Drainage: purulent  Drainage amount: small  Patient tolerance: Patient tolerated the procedure well with no immediate complications.      MDM  Patient with two separate skin abscess amenable to incision and drainage.  Abscesses were not large enough to warrant packing.  Recommend wound recheck with PCP in 2 days. Erythema surrounding  abscess.  Will d/c to home.  Due to patient having DM and surrounding erythema, will discharge patient home with Rx for Doxycycline.  Patient has Norco at home that has been prescribed by PCP for knee pain.  Therefore, patient was not given Rx for pain medication.        Pascal Lux Nevis, PA-C 07/29/11 [phone]

## 2011-07-28 NOTE — ED Notes (Signed)
PT states understanding of discharge instructions and need for follow up with PCP

## 2011-07-28 NOTE — ED Notes (Signed)
Patient has two abcess on his left lower abdominal area.  Not open/ with no drainage and painful and red

## 2011-07-29 NOTE — ED Provider Notes (Signed)
Medical screening examination/treatment/procedure(s) were performed by non-physician practitioner and as supervising physician I was immediately available for consultation/collaboration.    Nelia Shi, MD 07/29/11 2258

## 2011-08-04 ENCOUNTER — Emergency Department (HOSPITAL_COMMUNITY)
Admission: EM | Admit: 2011-08-04 | Discharge: 2011-08-05 | Disposition: A | Discharge status: Home / Self Care | Payer: Medicare Other | Attending: Emergency Medicine | Admitting: Emergency Medicine

## 2011-08-04 DIAGNOSIS — S80211A Abrasion, right knee, initial encounter: Secondary | ICD-10-CM

## 2011-08-04 DIAGNOSIS — E119 Type 2 diabetes mellitus without complications: Secondary | ICD-10-CM | POA: Insufficient documentation

## 2011-08-04 DIAGNOSIS — M79662 Pain in left lower leg: Secondary | ICD-10-CM

## 2011-08-04 DIAGNOSIS — M79609 Pain in unspecified limb: Secondary | ICD-10-CM | POA: Insufficient documentation

## 2011-08-04 DIAGNOSIS — E785 Hyperlipidemia, unspecified: Secondary | ICD-10-CM | POA: Insufficient documentation

## 2011-08-04 DIAGNOSIS — I10 Essential (primary) hypertension: Secondary | ICD-10-CM | POA: Insufficient documentation

## 2011-08-04 DIAGNOSIS — Z794 Long term (current) use of insulin: Secondary | ICD-10-CM | POA: Insufficient documentation

## 2011-08-04 DIAGNOSIS — IMO0002 Reserved for concepts with insufficient information to code with codable children: Secondary | ICD-10-CM | POA: Insufficient documentation

## 2011-08-04 DIAGNOSIS — R296 Repeated falls: Secondary | ICD-10-CM | POA: Insufficient documentation

## 2011-08-04 DIAGNOSIS — Z79899 Other long term (current) drug therapy: Secondary | ICD-10-CM | POA: Insufficient documentation

## 2011-08-04 DIAGNOSIS — M25569 Pain in unspecified knee: Secondary | ICD-10-CM | POA: Insufficient documentation

## 2011-08-04 DIAGNOSIS — S8000XA Contusion of unspecified knee, initial encounter: Secondary | ICD-10-CM | POA: Insufficient documentation

## 2011-08-05 ENCOUNTER — Emergency Department (HOSPITAL_COMMUNITY): Payer: Medicare Other

## 2011-08-05 ENCOUNTER — Encounter (HOSPITAL_COMMUNITY): Payer: Self-pay

## 2011-08-05 ENCOUNTER — Encounter (HOSPITAL_COMMUNITY): Payer: Self-pay | Admitting: *Deleted

## 2011-08-05 ENCOUNTER — Emergency Department (HOSPITAL_COMMUNITY)
Admission: EM | Admit: 2011-08-05 | Discharge: 2011-08-06 | Disposition: A | Discharge status: Home / Self Care | Payer: Medicare Other | Attending: Emergency Medicine | Admitting: Emergency Medicine

## 2011-08-05 DIAGNOSIS — E119 Type 2 diabetes mellitus without complications: Secondary | ICD-10-CM | POA: Insufficient documentation

## 2011-08-05 DIAGNOSIS — IMO0001 Reserved for inherently not codable concepts without codable children: Secondary | ICD-10-CM | POA: Insufficient documentation

## 2011-08-05 DIAGNOSIS — E785 Hyperlipidemia, unspecified: Secondary | ICD-10-CM | POA: Insufficient documentation

## 2011-08-05 DIAGNOSIS — M1711 Unilateral primary osteoarthritis, right knee: Secondary | ICD-10-CM

## 2011-08-05 DIAGNOSIS — M25569 Pain in unspecified knee: Secondary | ICD-10-CM | POA: Insufficient documentation

## 2011-08-05 DIAGNOSIS — IMO0002 Reserved for concepts with insufficient information to code with codable children: Secondary | ICD-10-CM | POA: Insufficient documentation

## 2011-08-05 DIAGNOSIS — M1710 Unilateral primary osteoarthritis, unspecified knee: Secondary | ICD-10-CM | POA: Insufficient documentation

## 2011-08-05 DIAGNOSIS — Z794 Long term (current) use of insulin: Secondary | ICD-10-CM | POA: Insufficient documentation

## 2011-08-05 DIAGNOSIS — I10 Essential (primary) hypertension: Secondary | ICD-10-CM | POA: Insufficient documentation

## 2011-08-05 MED ORDER — OXYCODONE-ACETAMINOPHEN 5-325 MG PO TABS
2.0000 | ORAL_TABLET | Freq: Once | ORAL | Status: AC
  Administered 2011-08-05: 2 via ORAL
  Filled 2011-08-05 (×2): qty 1

## 2011-08-05 NOTE — ED Notes (Signed)
Patient is back from xray

## 2011-08-05 NOTE — ED Notes (Signed)
Pt states that he fell on his right knee - pt is ambulatory but has an abrasion to right knee

## 2011-08-05 NOTE — ED Notes (Signed)
Patient transported to X-ray 

## 2011-08-05 NOTE — ED Notes (Signed)
The pt has had knee pain for awhile.  He was seen here last pm for the same

## 2011-08-05 NOTE — ED Provider Notes (Signed)
History     CSN: [id number]  Arrival date & time 08/04/11  2352   First MD Initiated Contact with Patient 08/05/11 0011      Chief Complaint  Patient presents with  . Knee Pain    right knee    (Consider location/radiation/quality/duration/timing/severity/associated sxs/prior treatment) HPI Comments: Patient with history of chronic knee pain and "collapse" of his right knee present since with worsening right knee and left tibial pain became acutely worse after falling this morning. No lightheadedness or palpitations prior to falling. He states this occurred due to his chronic knee problems.  Patient is able to walk without difficulty however states pain is worse with movement and palpation. Patient states he has been resting his legs. Initially the patient stated that he has not been taking any pain medications and has no pain medication at home. When asked about his chronic pain medications prescribed by his primary care physician the patient states that he has been taking these but they are not helping. Patient denies other injuries from the fall.  Patient is a 61 y.o. male presenting with knee pain. The history is provided by the patient.  Knee Pain This is a chronic problem. The problem has been gradually worsening. Associated symptoms include arthralgias. Pertinent negatives include no joint swelling, myalgias, neck pain, numbness or weakness. He has tried nothing for the symptoms.    Past Medical History  Diagnosis Date  . Lymphocytosis   . Mediastinal lymphadenopathy   . Diabetes mellitus   . Hyperlipidemia   . HTN (hypertension)     Past Surgical History  Procedure Date  . Knee surgery 11-01-2010    right  . Carpal tunnel release 08-2009    right    Family History  Problem Relation Age of Onset  . Allergies Sister   . Cancer Mother     stomach  . Pancreatic cancer Father     History  Substance Use Topics  . Smoking status: Current Everyday Smoker -- 0.5 packs/day  for 15 years    Types: Cigarettes  . Smokeless tobacco: Not on file  . Alcohol Use: No     quit in 2005      Review of Systems  Constitutional: Negative for activity change.  HENT: Negative for neck pain.   Musculoskeletal: Positive for arthralgias. Negative for myalgias, back pain, joint swelling and gait problem.  Skin: Positive for wound.  Neurological: Negative for weakness and numbness.    Allergies  Lunesta  Home Medications   Current Outpatient Rx  Name Route Sig Dispense Refill  . ALBUTEROL SULFATE HFA 108 (90 BASE) MCG/ACT IN AERS Inhalation Inhale 2 puffs into the lungs every 6 (six) hours as needed. For shortness of breath    . ATORVASTATIN CALCIUM 10 MG PO TABS Oral Take 10 mg by mouth daily.      Marland Kitchen DOXYCYCLINE HYCLATE 100 MG PO CAPS Oral Take 1 capsule (100 mg total) by mouth 2 (two) times daily. 20 capsule 0  . INSULIN ASPART 100 UNIT/ML [place] SOLN Subcutaneous Inject 20 Units into the skin 3 (three) times daily before meals.      . INSULIN GLARGINE 100 UNIT/ML [place] SOLN Subcutaneous Inject 34 Units into the skin at bedtime.     Marland Kitchen LEVOTHYROXINE SODIUM 50 MCG PO TABS Oral Take 50 mcg by mouth daily.      Marland Kitchen METFORMIN HCL 1000 MG PO TABS Oral Take 1,000 mg by mouth 2 (two) times daily with a meal.     .  METOPROLOL SUCCINATE ER 50 MG PO TB24 Oral Take 50 mg by mouth daily.      Marland Kitchen PAROXETINE HCL 40 MG PO TABS Oral Take 40 mg by mouth every morning.      . TESTOSTERONE 50 MG/5GM TD GEL Transdermal Place 5 g onto the skin daily.      . TRAZODONE HCL 50 MG PO TABS Oral Take 0.5-1 tablets by mouth at bedtime as needed. For sleep      BP 139/87  Pulse 95  Temp(Src) 97.7 F (36.5 C) (Oral)  Resp 24  SpO2 95%  Physical Exam  Nursing note and vitals reviewed. Constitutional: He is oriented to person, place, and time. He appears well-developed and well-nourished.  HENT:  Head: Normocephalic and atraumatic.  Eyes: Conjunctivae are normal.  Neck: Normal range of motion.  Neck supple.  Musculoskeletal: He exhibits tenderness. He exhibits no edema.       Right knee: He exhibits ecchymosis. He exhibits normal range of motion, no swelling, no effusion, no deformity, normal patellar mobility and no bony tenderness. no tenderness found. No medial joint line, no lateral joint line and no patellar tendon tenderness noted.       Left knee: Normal. He exhibits normal range of motion, no swelling, no effusion, no deformity and normal patellar mobility. no tenderness found.       Legs: Neurological: He is alert and oriented to person, place, and time. He has normal strength. No sensory deficit. Gait normal.       Motor, sensation, and vascular distal to the injury is fully intact.   Skin: Skin is warm and dry.  Psychiatric: He has a normal mood and affect.    ED Course  Procedures (including critical care time)  Labs Reviewed - No data to display No results found.   1. Abrasion of right knee   2. Pain of left lower leg    12:45 AM Patient seen and examined. Medication ordered.   Vital signs reviewed and are as follows: BP 139/87  Pulse 95  Temp(Src) 97.7 F (36.5 C) (Oral)  Resp 24  SpO2 95%  12:45 AM Patient was counseled on RICE protocol and told to rest injury, use ice for no longer than 15 minutes every hour, compress the area, and elevate above the level of their heart as much as possible to reduce swelling.  Questions answered.  Patient verbalized understanding.  Orthopedic referral given. Patient urged to followup if no improvement in pain in one week.     MDM  Patient with knee pain, abrasion and lower extremity pain after a fall. Area of abrasion does not appear infected. Patient takes pain medicine chronically. He was initially not forthcoming regarding the use of his pain medications. Patient was prescribed 90 hydrocodone tablets 14 days ago. Pain medicine given in emergency department. Rice protocol and anti-inflammatory medications are standard  of care for the patient's injuries. Given large amount of chronic pain medications I will not prescribe additional narcotics today.      Eustace Moore [place], [place] 08/05/11 [phone]

## 2011-08-05 NOTE — ED Provider Notes (Signed)
Medical screening examination/treatment/procedure(s) were performed by non-physician practitioner and as supervising physician I was immediately available for consultation/collaboration.  Flint Melter, MD 08/05/11 [phone]

## 2011-08-06 MED ORDER — OXYCODONE-ACETAMINOPHEN 5-325 MG PO TABS
2.0000 | ORAL_TABLET | Freq: Once | ORAL | Status: AC
  Administered 2011-08-06: 2 via ORAL
  Filled 2011-08-06: qty 2

## 2011-08-06 NOTE — ED Provider Notes (Signed)
Medical screening examination/treatment/procedure(s) were performed by non-physician practitioner and as supervising physician I was immediately available for consultation/collaboration.  Shandee Jergens, MD 08/06/11 0821 

## 2011-08-06 NOTE — ED Notes (Signed)
Paged ortho tech for knee sleeve.  

## 2011-08-06 NOTE — ED Notes (Signed)
Patient was AOx4 and comfortable with his discharge instructions.  Patient misunderstood that he needed to sign out with me, so he left without signing electronically.

## 2011-08-06 NOTE — ED Provider Notes (Signed)
History     CSN: [id number]  Arrival date & time 08/05/11  2238   First MD Initiated Contact with Patient 08/05/11 2314      Chief Complaint  Patient presents with  . Knee Pain    (Consider location/radiation/quality/duration/timing/severity/associated sxs/prior treatment) HPI Comments: Patient here with exacerbation of chronic right knee pain - states that he fell on the knee several days ago - was seen here with the pain and it was decided that he did not need a knee x-ray - he has continued to walk on the knee and reports that the pain is far worse - he is concerned that something may really be wrong with the knee.  He reports pain, abrasion and feeling like his knee "is going to give out"  Denies fever, chills, swelling in the knee.  States current pain medications not helping.  Patient is a 61 y.o. male presenting with knee pain. The history is provided by the patient. No language interpreter was used.  Knee Pain This is a recurrent problem. The current episode started in the past 7 days. The problem occurs constantly. The problem has been gradually worsening. Associated symptoms include arthralgias and myalgias. Pertinent negatives include no abdominal pain, anorexia, change in bowel habit, chills, congestion, coughing, fatigue, fever, headaches, nausea, neck pain, numbness, rash, sore throat, urinary symptoms, visual change, vomiting or weakness. The symptoms are aggravated by bending, walking and standing. He has tried nothing for the symptoms. The treatment provided no relief.    Past Medical History  Diagnosis Date  . Lymphocytosis   . Mediastinal lymphadenopathy   . Diabetes mellitus   . Hyperlipidemia   . HTN (hypertension)     Past Surgical History  Procedure Date  . Knee surgery 11-01-2010    right  . Carpal tunnel release 08-2009    right    Family History  Problem Relation Age of Onset  . Allergies Sister   . Cancer Mother     stomach  . Pancreatic cancer  Father     History  Substance Use Topics  . Smoking status: Current Everyday Smoker -- 0.5 packs/day for 15 years    Types: Cigarettes  . Smokeless tobacco: Not on file  . Alcohol Use: No     quit in 2005      Review of Systems  Constitutional: Negative for fever, chills and fatigue.  HENT: Negative for congestion, sore throat and neck pain.   Respiratory: Negative for cough.   Gastrointestinal: Negative for nausea, vomiting, abdominal pain, anorexia and change in bowel habit.  Musculoskeletal: Positive for myalgias and arthralgias.  Skin: Negative for rash.  Neurological: Negative for weakness, numbness and headaches.  All other systems reviewed and are negative.    Allergies  Lunesta  Home Medications   Current Outpatient Rx  Name Route Sig Dispense Refill  . ALBUTEROL SULFATE HFA 108 (90 BASE) MCG/ACT IN AERS Inhalation Inhale 2 puffs into the lungs every 6 (six) hours as needed. For shortness of breath    . ATORVASTATIN CALCIUM 10 MG PO TABS Oral Take 10 mg by mouth daily.      Marland Kitchen DOXYCYCLINE HYCLATE 100 MG PO CAPS Oral Take 1 capsule (100 mg total) by mouth 2 (two) times daily. 20 capsule 0  . HYDROCODONE-ACETAMINOPHEN 5-325 MG PO TABS Oral Take 1 tablet by mouth every 6 (six) hours as needed. For pain    . INSULIN ASPART 100 UNIT/ML [place] SOLN Subcutaneous Inject 20 Units into the skin 3 (  three) times daily before meals.      . INSULIN GLARGINE 100 UNIT/ML [place] SOLN Subcutaneous Inject 34 Units into the skin at bedtime.     Marland Kitchen LEVOTHYROXINE SODIUM 50 MCG PO TABS Oral Take 50 mcg by mouth daily.      Marland Kitchen METFORMIN HCL 1000 MG PO TABS Oral Take 1,000 mg by mouth 2 (two) times daily with a meal.     . METOPROLOL SUCCINATE ER 50 MG PO TB24 Oral Take 50 mg by mouth daily.      Marland Kitchen PAROXETINE HCL 40 MG PO TABS Oral Take 40 mg by mouth every morning.      . TESTOSTERONE 50 MG/5GM TD GEL Transdermal Place 5 g onto the skin daily.        BP 153/102  Pulse 105  Temp(Src) 98.1 F  (36.7 C) (Oral)  Resp 20  SpO2 98%  Physical Exam  Nursing note and vitals reviewed. Constitutional: He is oriented to person, place, and time. He appears well-developed and well-nourished. No distress.  HENT:  Head: Normocephalic and atraumatic.  Right Ear: External ear normal.  Left Ear: External ear normal.  Nose: Nose normal.  Mouth/Throat: Oropharynx is clear and moist. No oropharyngeal exudate.  Eyes: Conjunctivae are normal. Pupils are equal, round, and reactive to light. No scleral icterus.  Neck: Normal range of motion. Neck supple.  Cardiovascular: Normal rate, regular rhythm and normal heart sounds.  Exam reveals no gallop and no friction rub.   No murmur heard. Pulmonary/Chest: Effort normal and breath sounds normal. No respiratory distress.  Abdominal: Soft. Bowel sounds are normal. He exhibits no distension. There is no tenderness.  Musculoskeletal:       Right knee: He exhibits decreased range of motion, swelling, ecchymosis and bony tenderness. He exhibits no effusion, no erythema, normal alignment and normal patellar mobility. tenderness found. Patellar tendon tenderness noted.       Legs: Lymphadenopathy:    He has no cervical adenopathy.  Neurological: He is alert and oriented to person, place, and time. No cranial nerve deficit.  Skin: Skin is warm and dry. No rash noted. No erythema. No pallor.  Psychiatric: He has a normal mood and affect. His behavior is normal. Judgment and thought content normal.    ED Course  Procedures (including critical care time)  Labs Reviewed - No data to display Dg Knee Complete 4 Views Right  08/06/2011  *RADIOLOGY REPORT*  Clinical Data: Pain post fall.  RIGHT KNEE - COMPLETE 4+ VIEW  Comparison: 10/17/2010  Findings: Chondrocalcinosis in medial and lateral compartments. Small spurs about all three compartments of the knee.  Mild osteopenia.  Normal alignment.  No effusion.  Patchy femoropopliteal arterial calcifications.  Negative  for fracture or dislocation.  IMPRESSION:  1.  Negative for fracture or other acute bony abnormality. 3.  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  Original Report Authenticated By: Osa Craver, M.D.     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  This is not changed from previous x-ray - I have discussed with the patient that he will have to follow up with orthopedics about this issue, I have stressed to him that he is really doing himself a disservice by not seeing the specialist about this problem. I have reviewed his narcotic data base and he is getting about 20-30 extra pills per month from the ER, I will not be writing additional medication prescription and have asked  him to return to his PCP or chronic pain doctor for dicussion of how to adequately control his pain.        Izola Price [place], [place] 08/06/11 [phone]

## 2011-08-15 ENCOUNTER — Ambulatory Visit: Payer: Medicare Other

## 2011-08-15 ENCOUNTER — Telehealth: Payer: Self-pay

## 2011-08-15 ENCOUNTER — Ambulatory Visit (INDEPENDENT_AMBULATORY_CARE_PROVIDER_SITE_OTHER): Payer: Medicare Other | Admitting: Family Medicine

## 2011-08-15 VITALS — BP 126/83 | HR 88 | Temp 97.6°F | Resp 16 | Ht 71.5 in | Wt 263.0 lb

## 2011-08-15 DIAGNOSIS — J019 Acute sinusitis, unspecified: Secondary | ICD-10-CM

## 2011-08-15 DIAGNOSIS — D7289 Other specified disorders of white blood cells: Secondary | ICD-10-CM

## 2011-08-15 DIAGNOSIS — E119 Type 2 diabetes mellitus without complications: Secondary | ICD-10-CM | POA: Insufficient documentation

## 2011-08-15 DIAGNOSIS — E785 Hyperlipidemia, unspecified: Secondary | ICD-10-CM | POA: Insufficient documentation

## 2011-08-15 DIAGNOSIS — R05 Cough: Secondary | ICD-10-CM

## 2011-08-15 DIAGNOSIS — I10 Essential (primary) hypertension: Secondary | ICD-10-CM | POA: Insufficient documentation

## 2011-08-15 DIAGNOSIS — Z720 Tobacco use: Secondary | ICD-10-CM

## 2011-08-15 DIAGNOSIS — F17200 Nicotine dependence, unspecified, uncomplicated: Secondary | ICD-10-CM

## 2011-08-15 DIAGNOSIS — Z111 Encounter for screening for respiratory tuberculosis: Secondary | ICD-10-CM

## 2011-08-15 DIAGNOSIS — J029 Acute pharyngitis, unspecified: Secondary | ICD-10-CM

## 2011-08-15 LAB — POCT CBC
Granulocyte percent: 73.3 %G (ref 37–80)
Hemoglobin: 17.2 g/dL (ref 14.1–18.1)
MCH, POC: 30 pg (ref 27–31.2)
MCV: 89.9 fL (ref 80–97)
MPV: 8.7 fL (ref 0–99.8)
POC MID %: 4.5 %M (ref 0–12)
RBC: 5.74 M/uL (ref 4.69–6.13)
WBC: 18.6 10*3/uL — AB (ref 4.6–10.2)

## 2011-08-15 LAB — GLUCOSE, POCT (MANUAL RESULT ENTRY): POC Glucose: 93

## 2011-08-15 MED ORDER — LEVOFLOXACIN 500 MG PO TABS: 500.0000 mg | ORAL_TABLET | Freq: Every day | ORAL | Status: AC

## 2011-08-15 MED ORDER — CEFTRIAXONE SODIUM 1 G IJ SOLR
1.0000 g | Freq: Once | INTRAMUSCULAR | Status: AC
  Administered 2011-08-15: 1 g via INTRAMUSCULAR

## 2011-08-15 MED ORDER — BUPROPION HCL ER (SR) 150 MG PO TB12: 150.0000 mg | ORAL_TABLET | Freq: Two times a day (BID) | ORAL | Status: DC

## 2011-08-15 MED ORDER — GUAIFENESIN-CODEINE 100-10 MG/5ML PO SOLN: ORAL | Status: DC

## 2011-08-15 MED ORDER — BECLOMETHASONE DIPROPIONATE 40 MCG/ACT IN AERS: 1.0000 | INHALATION_SPRAY | Freq: Two times a day (BID) | RESPIRATORY_TRACT | Status: DC
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
## 2011-08-15 NOTE — Patient Instructions (Signed)
Cough, Adult  Return to clinic on 08/16/11 for a recheck. Go to the ER if worsens.   A cough is a reflex that helps clear your throat and airways. It can help heal the body or may be a reaction to an irritated airway. A cough may only last 2 or 3 weeks (acute) or may last more than 8 weeks (chronic).  CAUSES Acute cough:  Viral or bacterial infections.  Chronic cough:  Infections.   Allergies.   Asthma.   Post-nasal drip.   Smoking.   Heartburn or acid reflux.   Some medicines.   Chronic lung problems (COPD).   Cancer.  SYMPTOMS   Cough.   Fever.   Chest pain.   Increased breathing rate.   High-pitched whistling sound when breathing (wheezing).   Colored mucus that you cough up (sputum).  TREATMENT   A bacterial cough may be treated with antibiotic medicine.   A viral cough must run its course and will not respond to antibiotics.   Your caregiver may recommend other treatments if you have a chronic cough.  HOME CARE INSTRUCTIONS   Only take over-the-counter or prescription medicines for pain, discomfort, or fever as directed by your caregiver. Use cough suppressants only as directed by your caregiver.   Use a cold steam vaporizer or humidifier in your bedroom or home to help loosen secretions.   Sleep in a semi-upright position if your cough is worse at night.   Rest as needed.   Stop smoking if you smoke.  SEEK IMMEDIATE MEDICAL CARE IF:   You have pus in your sputum.   Your cough starts to worsen.   You cannot control your cough with suppressants and are losing sleep.   You begin coughing up blood.   You have difficulty breathing.   You develop pain which is getting worse or is uncontrolled with medicine.   You have a fever.  MAKE SURE YOU:   Understand these instructions.   Will watch your condition.   Will get help right away if you are not doing well or get worse.  Document Released: 12/15/2010 Document Revised: 02/28/2011 Document  Reviewed: 12/15/2010 [facility] Patient Information 2012 [place], [place].

## 2011-08-15 NOTE — Telephone Encounter (Signed)
PT COULD NOT UNDERSTAND MESSAGE LEFT ON RECORDER PLEASE CALL [phone] OKAY TO LEAVE MESSAGE ON MACHINE HE NEEDS TO KNOW IF HE IS SUPPOSED TO RETURN TO CLINIC  Thursday OR Friday.

## 2011-08-16 NOTE — Telephone Encounter (Signed)
Spoke with patient, per OV note pt is to recheck today.  He was notified and is feeling better-- will come in either today or tomorrow.

## 2011-08-17 LAB — CULTURE, GROUP A STREP: Organism ID, Bacteria: NORMAL

## 2011-08-17 NOTE — Progress Notes (Signed)
Quick Note:  Please call patient with normal throat culture. Thanks! KR  ______

## 2011-08-23 ENCOUNTER — Ambulatory Visit (INDEPENDENT_AMBULATORY_CARE_PROVIDER_SITE_OTHER): Payer: Medicare Other | Admitting: Family Medicine

## 2011-08-23 VITALS — BP 114/75 | HR 69 | Temp 97.6°F | Resp 18 | Ht 71.5 in | Wt 277.0 lb

## 2011-08-23 DIAGNOSIS — J40 Bronchitis, not specified as acute or chronic: Secondary | ICD-10-CM

## 2011-08-23 DIAGNOSIS — R05 Cough: Secondary | ICD-10-CM

## 2011-08-23 DIAGNOSIS — D72829 Elevated white blood cell count, unspecified: Secondary | ICD-10-CM

## 2011-08-23 LAB — POCT CBC
Granulocyte percent: 68.2 %G (ref 37–80)
Hemoglobin: 14.6 g/dL (ref 14.1–18.1)
MCH, POC: 28.7 pg (ref 27–31.2)
MPV: 8.8 fL (ref 0–99.8)
POC MID %: 5.5 %M (ref 0–12)
Platelet Count, POC: 217 10*3/uL (ref 142–424)
RBC: 5.09 M/uL (ref 4.69–6.13)
WBC: 12.5 10*3/uL — AB (ref 4.6–10.2)

## 2011-08-23 MED ORDER — GUAIFENESIN-CODEINE 100-10 MG/5ML PO SOLN: ORAL | Status: AC

## 2011-08-23 MED ORDER — IPRATROPIUM BROMIDE 0.03 % NA SOLN: 2.0000 | Freq: Two times a day (BID) | NASAL | Status: DC

## 2011-08-23 NOTE — Progress Notes (Signed)
Subjective:    Patient ID: Derek Calderon, male    DOB: [birth year], 61 y.o.   MRN: [id number]  HPI 61 yo male with multiple medical problems here to recheck bronchitis/sinusitis.  Seen 2/13 and treated with rocephin, levaquin, qvar.  White count 18.6. Reports doing better.  Breathing bettter.  Feels better.  Still slight cough.  Has finished medicine.  Would like refill on cough medicine.  Still having clear, watery, runny nose.  No fevers.  Never returned to have ppd read.     Review of Systems Negative except as per HPI     Objective:   Physical Exam  Constitutional: He appears well-developed. No distress.  HENT:  Right Ear: Tympanic membrane, external ear and ear canal normal. Tympanic membrane is not injected, not scarred, not perforated, not erythematous, not retracted and not bulging.  Left Ear: Tympanic membrane, external ear and ear canal normal. Tympanic membrane is not injected, not scarred, not perforated, not erythematous, not retracted and not bulging.  Nose: No mucosal edema or rhinorrhea. Right sinus exhibits no maxillary sinus tenderness and no frontal sinus tenderness. Left sinus exhibits no maxillary sinus tenderness and no frontal sinus tenderness.  Mouth/Throat: Uvula is midline, oropharynx is clear and moist and mucous membranes are normal. No oropharyngeal exudate or tonsillar abscesses.  Cardiovascular: Normal rate, regular rhythm, normal heart sounds and intact distal pulses.   No murmur heard. Pulmonary/Chest: Effort normal and breath sounds normal. No respiratory distress. He has no wheezes. He has no rales.  Lymphadenopathy:       Head (right side): No submandibular and no preauricular adenopathy present.       Head (left side): No submandibular and no preauricular adenopathy present.       Right cervical: No superficial cervical and no posterior cervical adenopathy present.      Left cervical: No superficial cervical and no posterior cervical adenopathy  present.       Right: No supraclavicular adenopathy present.       Left: No supraclavicular adenopathy present.  Skin: Skin is warm and dry.    Results for orders placed in visit on 08/23/11  POCT CBC      Component Value Range   WBC 12.5 (*) 4.6 - 10.2 (K/uL)   Lymph, poc 3.3  0.6 - 3.4    POC LYMPH PERCENT 26.3  10 - 50 (%L)   MID (cbc) 0.7  0 - 0.9    POC MID % 5.5  0 - 12 (%M)   POC Granulocyte 8.5 (*) 2 - 6.9    Granulocyte percent 68.2  37 - 80 (%G)   RBC 5.09  4.69 - 6.13 (M/uL)   Hemoglobin 14.6  14.1 - 18.1 (g/dL)   HCT, POC 69.6  29.5 - 53.7 (%)   MCV 90.4  80 - 97 (fL)   MCH, POC 28.7  27 - 31.2 (pg)   MCHC 31.7 (*) 31.8 - 35.4 (g/dL)   RDW, POC 28.4     Platelet Count, POC 217  142 - 424 (K/uL)   MPV 8.8  0 - 99.8 (fL)         Assessment & Plan:  Bronchitis, leukocytosis - improving.  REfilled cough medicine.  No more refills after this.  ATrovent for runny nose.  PPD places due to illness and lymphadeopathy.  Patient will be unable to return over the weekend for read so not placed today.  ADvised to return at his earliest convenience to have this read.  REminded him to be sure he can come back 48-72 hours later.

## 2011-09-06 ENCOUNTER — Telehealth: Payer: Self-pay

## 2011-09-06 NOTE — Telephone Encounter (Signed)
.  [facility]     Pt requesting refill for lorazapan    Best phone [phone]  [facility]

## 2011-09-07 MED ORDER — LORAZEPAM 1 MG PO TABS: 0.5000 mg | ORAL_TABLET | Freq: Every evening | ORAL | Status: DC | PRN

## 2011-09-07 NOTE — Telephone Encounter (Signed)
Written - at TL desk. by pt will need an ov.

## 2011-09-07 NOTE — Telephone Encounter (Signed)
Pt is calling second day for his med refills

## 2011-09-07 NOTE — Telephone Encounter (Signed)
Called Rx into [facility] as printed from [employer]. Called pt to notify done and that he is then due for OV. Pt agreed to come in probably next week.

## 2011-09-07 NOTE — Telephone Encounter (Signed)
Chart pulled to PA 

## 2011-09-09 ENCOUNTER — Emergency Department (HOSPITAL_COMMUNITY)
Admission: EM | Admit: 2011-09-09 | Discharge: 2011-09-09 | Disposition: A | Discharge status: Home / Self Care | Payer: Medicare Other | Attending: Emergency Medicine | Admitting: Emergency Medicine

## 2011-09-09 ENCOUNTER — Encounter (HOSPITAL_COMMUNITY): Payer: Self-pay | Admitting: *Deleted

## 2011-09-09 DIAGNOSIS — Z79899 Other long term (current) drug therapy: Secondary | ICD-10-CM | POA: Insufficient documentation

## 2011-09-09 DIAGNOSIS — L02219 Cutaneous abscess of trunk, unspecified: Secondary | ICD-10-CM | POA: Insufficient documentation

## 2011-09-09 DIAGNOSIS — L03319 Cellulitis of trunk, unspecified: Secondary | ICD-10-CM | POA: Insufficient documentation

## 2011-09-09 DIAGNOSIS — E119 Type 2 diabetes mellitus without complications: Secondary | ICD-10-CM | POA: Insufficient documentation

## 2011-09-09 DIAGNOSIS — E785 Hyperlipidemia, unspecified: Secondary | ICD-10-CM | POA: Insufficient documentation

## 2011-09-09 DIAGNOSIS — I10 Essential (primary) hypertension: Secondary | ICD-10-CM | POA: Insufficient documentation

## 2011-09-09 DIAGNOSIS — F17200 Nicotine dependence, unspecified, uncomplicated: Secondary | ICD-10-CM | POA: Insufficient documentation

## 2011-09-09 DIAGNOSIS — L02211 Cutaneous abscess of abdominal wall: Secondary | ICD-10-CM

## 2011-09-09 MED ORDER — HYDROCODONE-ACETAMINOPHEN 5-325 MG PO TABS
1.0000 | ORAL_TABLET | Freq: Once | ORAL | Status: AC
  Administered 2011-09-09: 1 via ORAL
  Filled 2011-09-09: qty 1

## 2011-09-09 MED ORDER — HYDROCODONE-ACETAMINOPHEN 5-500 MG PO TABS: 1.0000 | ORAL_TABLET | Freq: Four times a day (QID) | ORAL | Status: AC | PRN

## 2011-09-09 NOTE — ED Notes (Signed)
The pt has an infected lesioin to the rt lower abd for 3 weeks

## 2011-09-09 NOTE — Discharge Instructions (Signed)
Abscess Care After An abscess (also called a boil or furuncle) is an infected area that contains a collection of pus. Signs and symptoms of an abscess include pain, tenderness, redness, or hardness, or you may feel a moveable soft area under your skin. An abscess can occur anywhere in the body. The infection may spread to surrounding tissues causing cellulitis. A cut (incision) by the surgeon was made over your abscess and the pus was drained out. Gauze may have been packed into the space to provide a drain that will allow the cavity to heal from the inside outwards. The boil may be painful for 5 to 7 days. Most people with a boil do not have high fevers. Your abscess, if seen early, may not have localized, and may not have been lanced. If not, another appointment may be required for this if it does not get better on its own or with medications. HOME CARE INSTRUCTIONS   Only take over-the-counter or prescription medicines for pain, discomfort, or fever as directed by your caregiver.   When you bathe, soak and then remove gauze or iodoform packs at least daily or as directed by your caregiver. You may then wash the wound gently with mild soapy water. Repack with gauze or do as your caregiver directs.  SEEK IMMEDIATE MEDICAL CARE IF:   You develop increased pain, swelling, redness, drainage, or bleeding in the wound site.   You develop signs of generalized infection including muscle aches, chills, fever, or a general ill feeling.   An oral temperature above 102 F (38.9 C) develops, not controlled by medication.  See your caregiver for a recheck if you develop any of the symptoms described above. If medications (antibiotics) were prescribed, take them as directed. Document Released: 01/04/2005 Document Revised: 06/07/2011 Document Reviewed: 09/01/2007 [facility] Patient Information 2012 [place], [place].  Have the clinic, recheck your abscess on Tuesday.  The packing should be removed at that time.  He  should not need any further packing change.  The outside dressing as needed

## 2011-09-09 NOTE — ED Provider Notes (Signed)
History     CSN: [id number]  Arrival date & time 09/09/11  0014   First MD Initiated Contact with Patient 09/09/11 0133      Chief Complaint  Patient presents with  . Recurrent Skin Infections    (Consider location/radiation/quality/duration/timing/severity/associated sxs/prior treatment) HPI Comments: Patient has recurrent skin, infections.  He's been doing very well with the Hibiclens and hasn't had an abscess since January.  He now has a small abscess on the right lower abdominal wall.  That's been there for several days  The history is provided by the patient.    Past Medical History  Diagnosis Date  . Lymphocytosis   . Mediastinal lymphadenopathy   . Diabetes mellitus   . Hyperlipidemia   . HTN (hypertension)     Past Surgical History  Procedure Date  . Knee surgery 11-01-2010    right  . Carpal tunnel release 08-2009    right    Family History  Problem Relation Age of Onset  . Allergies Sister   . Cancer Mother     stomach  . Pancreatic cancer Father     History  Substance Use Topics  . Smoking status: Current Everyday Smoker -- 0.5 packs/day for 15 years    Types: Cigarettes  . Smokeless tobacco: Not on file  . Alcohol Use: No     quit in 2005      Review of Systems  Constitutional: Negative for fever.  Musculoskeletal: Positive for myalgias.  Skin: Positive for wound.    Allergies  Lunesta  Home Medications   Current Outpatient Rx  Name Route Sig Dispense Refill  . ALBUTEROL SULFATE HFA 108 (90 BASE) MCG/ACT IN AERS Inhalation Inhale 2 puffs into the lungs every 6 (six) hours as needed. For shortness of breath    . ATORVASTATIN CALCIUM 10 MG PO TABS Oral Take 10 mg by mouth daily.      . BECLOMETHASONE DIPROPIONATE 40 MCG/ACT IN AERS Inhalation Inhale 1 puff into the lungs 2 (two) times daily. 1 Inhaler 0  . BUPROPION HCL ER (SR) 150 MG PO TB12 Oral Take 1 tablet (150 mg total) by mouth 2 (two) times daily. 60 tablet 3  . INSULIN ASPART  100 UNIT/ML [place] SOLN Subcutaneous Inject 20 Units into the skin 3 (three) times daily before meals. Per sliding scale    . INSULIN GLARGINE 100 UNIT/ML [place] SOLN Subcutaneous Inject 34 Units into the skin at bedtime.     . IPRATROPIUM BROMIDE 0.03 % NA SOLN Nasal Place 2 sprays into the nose every 12 (twelve) hours. 30 mL 1  . LEVOTHYROXINE SODIUM 50 MCG PO TABS Oral Take 50 mcg by mouth daily.      Marland Kitchen LORAZEPAM 1 MG PO TABS Oral Take 0.5-1 tablets (0.5-1 mg total) by mouth at bedtime as needed for anxiety. 30 tablet 0  . METFORMIN HCL 1000 MG PO TABS Oral Take 1,000 mg by mouth 2 (two) times daily with a meal.     . METOPROLOL SUCCINATE ER 50 MG PO TB24 Oral Take 50 mg by mouth 2 (two) times daily.     . TESTOSTERONE 50 MG/5GM TD GEL Transdermal Place 5 g onto the skin daily. Apply to shoulder    . HYDROCODONE-ACETAMINOPHEN 5-500 MG PO TABS Oral Take 1-2 tablets by mouth every 6 (six) hours as needed for pain. 15 tablet 0    BP 148/96  Pulse 72  Temp(Src) 97.4 F (36.3 C) (Oral)  Resp 18  SpO2 98%  Physical Exam  Constitutional: He is oriented to person, place, and time. He appears well-developed.  HENT:  Head: Normocephalic.  Eyes: Pupils are equal, round, and reactive to light.  Neck: Normal range of motion.  Abdominal: Soft. Bowel sounds are normal. He exhibits no distension.       1 cm ovoid abscess, lateral lower right abdominal wall  Musculoskeletal: Normal range of motion.  Neurological: He is alert and oriented to person, place, and time.  Skin: Skin is warm.    ED Course  INCISION AND DRAINAGE Date/Time: 09/09/2011 3:40 AM Performed by: Arman Filter Authorized by: Arman Filter Consent: Verbal consent obtained. Consent given by: patient Patient identity confirmed: verbally with patient Time out: Immediately prior to procedure a "time out" was called to verify the correct patient, procedure, equipment, support staff and site/side marked as required. Type: abscess Body  area: trunk Anesthesia: local infiltration Local anesthetic: lidocaine 1% with epinephrine Anesthetic total: 1 ml Patient sedated: no Scalpel size: 11 Incision type: single straight Complexity: simple Drainage: purulent Drainage amount: moderate Packing material: 1/4 in iodoform gauze Patient tolerance: Patient tolerated the procedure well with no immediate complications.   (including critical care time)  Labs Reviewed - No data to display No results found.   1. Abscess of abdominal wall       MDM  James small abdominal abscess packing and dressing applied        Arman Filter, NP 09/09/11 0344  Arman Filter, NP 09/09/11 [phone]

## 2011-09-11 NOTE — ED Provider Notes (Signed)
Medical screening examination/treatment/procedure(s) were performed by non-physician practitioner and as supervising physician I was immediately available for consultation/collaboration.   Vida Roller, MD 09/11/11 [phone]

## 2011-09-18 ENCOUNTER — Encounter: Payer: Self-pay | Admitting: Internal Medicine

## 2011-10-08 ENCOUNTER — Emergency Department (HOSPITAL_COMMUNITY)
Admission: EM | Admit: 2011-10-08 | Discharge: 2011-10-08 | Disposition: A | Discharge status: Home / Self Care | Payer: Medicare Other | Attending: Emergency Medicine | Admitting: Emergency Medicine

## 2011-10-08 ENCOUNTER — Encounter (HOSPITAL_COMMUNITY): Payer: Self-pay | Admitting: Emergency Medicine

## 2011-10-08 DIAGNOSIS — E785 Hyperlipidemia, unspecified: Secondary | ICD-10-CM | POA: Insufficient documentation

## 2011-10-08 DIAGNOSIS — E119 Type 2 diabetes mellitus without complications: Secondary | ICD-10-CM | POA: Insufficient documentation

## 2011-10-08 DIAGNOSIS — Z794 Long term (current) use of insulin: Secondary | ICD-10-CM | POA: Insufficient documentation

## 2011-10-08 DIAGNOSIS — Z79899 Other long term (current) drug therapy: Secondary | ICD-10-CM | POA: Insufficient documentation

## 2011-10-08 DIAGNOSIS — L700 Acne vulgaris: Secondary | ICD-10-CM

## 2011-10-08 DIAGNOSIS — L708 Other acne: Secondary | ICD-10-CM | POA: Insufficient documentation

## 2011-10-08 DIAGNOSIS — I10 Essential (primary) hypertension: Secondary | ICD-10-CM | POA: Insufficient documentation

## 2011-10-08 DIAGNOSIS — F17200 Nicotine dependence, unspecified, uncomplicated: Secondary | ICD-10-CM | POA: Insufficient documentation

## 2011-10-08 NOTE — ED Notes (Signed)
Pt reports cyst on left hip that is bothering him, sts it will probably need to be drained.

## 2011-10-08 NOTE — ED Notes (Signed)
Unable to find pt in triage or waiting room. 

## 2011-10-08 NOTE — Discharge Instructions (Signed)
Please followup with primary care provider and dentist for continued evaluation and treatment. Return for any worsening symptoms, increased swelling or redness, fever, chills.   RESOURCE GUIDE  Dental Problems  Patients with Medicaid: [facility] [phone] [address]                                           [phone] [address] Phone:  [phone]                                                  Phone:  [phone]  If unable to pay or uninsured, contact:  [facility] or [facility]. to become qualified for the adult dental clinic.  Chronic Pain Problems Contact [facility]  [phone] Patients need to be referred by their primary care doctor.  Insufficient Money for Medicine Contact [employer]:  call "211" or [facility] [phone].  No Primary Care Doctor Call [facility]  [phone] Other agencies that provide inexpensive medical care    [facility]  [phone]    [facility]  [phone]    [facility]  [phone]    [facility]  [phone]    [facility]  [phone]    [facility]  [phone]  Psychological Services [facility]  [phone] [facility]  [phone] [facility]   [phone] (emergency services [phone])  Substance Abuse Resources [facility] and Drug Services  [phone] [facility] [phone] [facility] [place] [phone] Floydene Flock [phone] Residential & Outpatient Substance Abuse Program  [phone]  Abuse/Neglect [facility] Child Abuse Hotline [phone] [facility] Child Abuse Hotline (361) 195-4569 (After Hours)  Emergency Shelter [facility] [phone]  Maternity Homes [facility] at the [place] [facility] [place] [phone] [facility] Services [phone]  MRSA Hotline #:   937 508 8157    [facility]  Resources  [facility] [facility]     [employer]                          [facility] Dept. [address] [place]                       [address]      [address]  [place]                                                Cristobal Goldmann Phone:  [phone]                                   Phone:  [phone]  Phone:  [phone]  [facility] Mental Health Phone:  [phone]  [facility] Child Abuse Hotline ([phone] [phone] (After Hours)

## 2011-10-08 NOTE — ED Provider Notes (Signed)
History     CSN: [id number]  Arrival date & time 10/08/11  Derek Calderon   First MD Initiated Contact with Patient 10/08/11 2237      Chief Complaint  Patient presents with  . Hip Pain     HPI  History provided by the patient. Patient is a 61 year old male with history of diabetes, hypertension who presents with complaints of skin lesion to left hip area the past few days. Patient reports having history of similar infections of the skin multiple times. Patient has history of multiple visits to the emergency room for same complaints. Symptoms came on acutely and have been gradually worsening. Pain is worse with palpation pressure. Patient denies any other aggravating or alleviating factors. Patient has been trying to use ibuprofen without significant improvements. Pt denies any associated fever, chills or sweats.    Past Medical History  Diagnosis Date  . Lymphocytosis   . Mediastinal lymphadenopathy   . Diabetes mellitus   . Hyperlipidemia   . HTN (hypertension)     Past Surgical History  Procedure Date  . Knee surgery 11-01-2010    right  . Carpal tunnel release 08-2009    right    Family History  Problem Relation Age of Onset  . Allergies Sister   . Cancer Mother     stomach  . Pancreatic cancer Father     History  Substance Use Topics  . Smoking status: Current Everyday Smoker -- 0.5 packs/day for 15 years    Types: Cigarettes  . Smokeless tobacco: Not on file  . Alcohol Use: No     quit in 2005      Review of Systems  Constitutional: Negative for fever and chills.  Gastrointestinal: Negative for nausea, vomiting and abdominal pain.  Neurological: Negative for numbness.    Allergies  Lunesta and Neosporin  Home Medications   Current Outpatient Rx  Name Route Sig Dispense Refill  . ALBUTEROL SULFATE HFA 108 (90 BASE) MCG/ACT IN AERS Inhalation Inhale 2 puffs into the lungs every 6 (six) hours as needed. For shortness of breath    . ATORVASTATIN CALCIUM 10 MG  PO TABS Oral Take 10 mg by mouth daily.      . BECLOMETHASONE DIPROPIONATE 40 MCG/ACT IN AERS Inhalation Inhale 1 puff into the lungs 2 (two) times daily. 1 Inhaler 0  . BUPROPION HCL ER (SR) 150 MG PO TB12 Oral Take 1 tablet (150 mg total) by mouth 2 (two) times daily. 60 tablet 3  . INSULIN ASPART 100 UNIT/ML [place] SOLN Subcutaneous Inject 20 Units into the skin 3 (three) times daily before meals. Per sliding scale    . INSULIN GLARGINE 100 UNIT/ML [place] SOLN Subcutaneous Inject 34 Units into the skin at bedtime.     . IPRATROPIUM BROMIDE 0.03 % NA SOLN Nasal Place 2 sprays into the nose every 12 (twelve) hours. 30 mL 1  . LEVOTHYROXINE SODIUM 50 MCG PO TABS Oral Take 50 mcg by mouth daily.      Marland Kitchen METFORMIN HCL 1000 MG PO TABS Oral Take 1,000 mg by mouth 2 (two) times daily with a meal.     . METOPROLOL SUCCINATE ER 50 MG PO TB24 Oral Take 50 mg by mouth 2 (two) times daily.     . TESTOSTERONE 50 MG/5GM TD GEL Transdermal Place 5 g onto the skin daily. Apply to shoulder      BP 124/77  Pulse 93  Temp(Src) 98.5 F (36.9 C) (Oral)  Resp 18  SpO2  97%  Physical Exam  Nursing note and vitals reviewed. Constitutional: He is oriented to person, place, and time. He appears well-developed and well-nourished. No distress.  HENT:  Head: Normocephalic.  Neck: Normal range of motion. Neck supple.  Cardiovascular: Normal rate and regular rhythm.   Pulmonary/Chest: Effort normal and breath sounds normal. No respiratory distress. He has no wheezes.  Abdominal: Soft. He exhibits no distension. There is no tenderness.  Lymphadenopathy:    He has no cervical adenopathy.  Neurological: He is alert and oriented to person, place, and time.  Skin:       1 cm circular area of erythema to lateral left buttocks and thigh area. Small pointing. No pustule seen. No induration.   Psychiatric: He has a normal mood and affect. His behavior is normal.    ED Course  Procedures       1. Acne vulgaris        MDM  10:40 PM patient seen and evaluated. Patient no acute distress.        Angus Seller, [place] 10/09/11 [id number]

## 2011-10-09 NOTE — ED Provider Notes (Signed)
Medical screening examination/treatment/procedure(s) were performed by non-physician practitioner and as supervising physician I was immediately available for consultation/collaboration.  Ugochukwu Chichester R. Doniesha Landau, MD 10/09/11 2257 

## 2011-10-18 ENCOUNTER — Other Ambulatory Visit: Payer: Self-pay | Admitting: Family Medicine

## 2011-10-22 ENCOUNTER — Encounter (HOSPITAL_COMMUNITY): Payer: Self-pay | Admitting: *Deleted

## 2011-10-22 ENCOUNTER — Emergency Department (HOSPITAL_COMMUNITY)
Admission: EM | Admit: 2011-10-22 | Discharge: 2011-10-23 | Disposition: A | Discharge status: Home / Self Care | Payer: Medicare Other | Attending: Emergency Medicine | Admitting: Emergency Medicine

## 2011-10-22 DIAGNOSIS — E785 Hyperlipidemia, unspecified: Secondary | ICD-10-CM | POA: Insufficient documentation

## 2011-10-22 DIAGNOSIS — M542 Cervicalgia: Secondary | ICD-10-CM | POA: Insufficient documentation

## 2011-10-22 DIAGNOSIS — R209 Unspecified disturbances of skin sensation: Secondary | ICD-10-CM | POA: Insufficient documentation

## 2011-10-22 DIAGNOSIS — F17200 Nicotine dependence, unspecified, uncomplicated: Secondary | ICD-10-CM | POA: Insufficient documentation

## 2011-10-22 DIAGNOSIS — E119 Type 2 diabetes mellitus without complications: Secondary | ICD-10-CM | POA: Insufficient documentation

## 2011-10-22 DIAGNOSIS — Z794 Long term (current) use of insulin: Secondary | ICD-10-CM | POA: Insufficient documentation

## 2011-10-22 DIAGNOSIS — I10 Essential (primary) hypertension: Secondary | ICD-10-CM | POA: Insufficient documentation

## 2011-10-22 DIAGNOSIS — X58XXXA Exposure to other specified factors, initial encounter: Secondary | ICD-10-CM | POA: Insufficient documentation

## 2011-10-22 LAB — GLUCOSE, CAPILLARY: Glucose-Capillary: 164 mg/dL — ABNORMAL HIGH (ref 70–99)

## 2011-10-22 NOTE — ED Notes (Signed)
Patient wearing a soft collar that he had from his last visit to the ED
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
## 2011-10-23 ENCOUNTER — Encounter: Payer: Self-pay | Admitting: Family Medicine

## 2011-10-23 ENCOUNTER — Emergency Department (HOSPITAL_COMMUNITY): Payer: Medicare Other

## 2011-10-23 ENCOUNTER — Ambulatory Visit (INDEPENDENT_AMBULATORY_CARE_PROVIDER_SITE_OTHER): Payer: Medicare Other | Admitting: Family Medicine

## 2011-10-23 VITALS — BP 116/79 | HR 85 | Temp 98.1°F | Resp 16 | Ht 71.5 in | Wt 261.6 lb

## 2011-10-23 DIAGNOSIS — E119 Type 2 diabetes mellitus without complications: Secondary | ICD-10-CM

## 2011-10-23 DIAGNOSIS — M542 Cervicalgia: Secondary | ICD-10-CM

## 2011-10-23 DIAGNOSIS — R05 Cough: Secondary | ICD-10-CM

## 2011-10-23 DIAGNOSIS — E349 Endocrine disorder, unspecified: Secondary | ICD-10-CM

## 2011-10-23 DIAGNOSIS — R059 Cough, unspecified: Secondary | ICD-10-CM

## 2011-10-23 DIAGNOSIS — E291 Testicular hypofunction: Secondary | ICD-10-CM

## 2011-10-23 MED ORDER — PREDNISONE 20 MG PO TABS: ORAL_TABLET | ORAL | Status: DC

## 2011-10-23 MED ORDER — BECLOMETHASONE DIPROPIONATE 40 MCG/ACT IN AERS: 1.0000 | INHALATION_SPRAY | Freq: Two times a day (BID) | RESPIRATORY_TRACT | Status: DC

## 2011-10-23 MED ORDER — CYCLOBENZAPRINE HCL 10 MG PO TABS
10.0000 mg | ORAL_TABLET | Freq: Three times a day (TID) | ORAL | Status: DC | PRN
  Administered 2011-10-23: 10 mg via ORAL
  Filled 2011-10-23: qty 1

## 2011-10-23 MED ORDER — METHOCARBAMOL 750 MG PO TABS: ORAL_TABLET | ORAL | Status: DC

## 2011-10-23 MED ORDER — PREDNISONE 20 MG PO TABS
60.0000 mg | ORAL_TABLET | ORAL | Status: AC
  Administered 2011-10-23: 60 mg via ORAL
  Filled 2011-10-23: qty 3

## 2011-10-23 MED ORDER — HYDROCODONE-ACETAMINOPHEN 5-500 MG PO TABS: 1.0000 | ORAL_TABLET | ORAL | Status: AC | PRN

## 2011-10-23 MED ORDER — CYCLOBENZAPRINE HCL 10 MG PO TABS: 10.0000 mg | ORAL_TABLET | Freq: Three times a day (TID) | ORAL | Status: AC | PRN

## 2011-10-23 MED ORDER — TRAMADOL HCL 50 MG PO TABS
100.0000 mg | ORAL_TABLET | Freq: Once | ORAL | Status: AC
  Administered 2011-10-23: 100 mg via ORAL
  Filled 2011-10-23: qty 2

## 2011-10-23 MED ORDER — TRAMADOL-ACETAMINOPHEN 37.5-325 MG PO TABS: ORAL_TABLET | ORAL | Status: AC

## 2011-10-23 MED ORDER — TESTOSTERONE 50 MG/5GM (1%) TD GEL: 5.0000 g | Freq: Every day | TRANSDERMAL | Status: DC

## 2011-10-23 NOTE — Patient Instructions (Signed)
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
## 2011-10-23 NOTE — ED Notes (Signed)
Pt re-applies neck collar which he came in with.  No distress noted, pt tells me he will have "Hopper give me som stronger pain medicine"

## 2011-10-23 NOTE — Discharge Instructions (Signed)
Try heat to relax your  neck muscles alternating with ice packs. Take medications as prescribed. You can discuss your neck pain with Dr. Ophelia Charter as you see him for your knee replacement.   Cervical Sprain A cervical sprain is an injury in the neck in which the ligaments are stretched or torn. The ligaments are the tissues that hold the bones of the neck (vertebrae) in place.Cervical sprains can range from very mild to very severe. Most cervical sprains get better in 1 to 3 weeks, but it depends on the cause and extent of the injury. Severe cervical sprains can cause the neck vertebrae to be unstable. This can lead to damage of the spinal cord and can result in serious nervous system problems. Your caregiver will determine whether your cervical sprain is mild or severe. CAUSES  Severe cervical sprains may be caused by:  Contact sport injuries (football, rugby, wrestling, hockey, auto racing, gymnastics, diving, martial arts, boxing).   Motor vehicle collisions.   Whiplash injuries. This means the neck is forcefully whipped backward and forward.   Falls.  Mild cervical sprains may be caused by:   Awkward positions, such as cradling a telephone between your ear and shoulder.   Sitting in a chair that does not offer proper support.   Working at a poorly Marketing executive station.   Activities that require looking up or down for long periods of time.  SYMPTOMS   Pain, soreness, stiffness, or a burning sensation in the front, back, or sides of the neck. This discomfort may develop immediately after injury or it may develop slowly and not begin for 24 hours or more after an injury.   Pain or tenderness directly in the middle of the back of the neck.   Shoulder or upper back pain.   Limited ability to move the neck.   Headache.   Dizziness.   Weakness, numbness, or tingling in the hands or arms.   Muscle spasms.   Difficulty swallowing or chewing.   Tenderness and swelling of the  neck.  DIAGNOSIS  Most of the time, your caregiver can diagnose this problem by taking your history and doing a physical exam. Your caregiver will ask about any known problems, such as arthritis in the neck or a previous neck injury. X-rays may be taken to find out if there are any other problems, such as problems with the bones of the neck. However, an X-ray often does not reveal the full extent of a cervical sprain. Other tests such as a computed tomography (CT) scan or magnetic resonance imaging (MRI) may be needed. TREATMENT  Treatment depends on the severity of the cervical sprain. Mild sprains can be treated with rest, keeping the neck in place (immobilization), and pain medicines. Severe cervical sprains need immediate immobilization and an appointment with an orthopedist or neurosurgeon. Several treatment options are available to help with pain, muscle spasms, and other symptoms. Your caregiver may prescribe:  Medicines, such as pain relievers, numbing medicines, or muscle relaxants.   Physical therapy. This can include stretching exercises, strengthening exercises, and posture training. Exercises and improved posture can help stabilize the neck, strengthen muscles, and help stop symptoms from returning.   A neck collar to be worn for short periods of time. Often, these collars are worn for comfort. However, certain collars may be worn to protect the neck and prevent further worsening of a serious cervical sprain.  HOME CARE INSTRUCTIONS   Put ice on the injured area.   Put  ice in a plastic bag.   Place a towel between your skin and the bag.   Leave the ice on for 15 to 20 minutes, 3 to 4 times a day.   Only take over-the-counter or prescription medicines for pain, discomfort, or fever as directed by your caregiver.   Keep all follow-up appointments as directed by your caregiver.   Keep all physical therapy appointments as directed by your caregiver.   If a neck collar is  prescribed, wear it as directed by your caregiver.   Do not drive while wearing a neck collar.   Make any needed adjustments to your work station to promote good posture.   Avoid positions and activities that make your symptoms worse.   Warm up and stretch before being active to help prevent problems.  SEEK MEDICAL CARE IF:   Your pain is not controlled with medicine.   You are unable to decrease your pain medicine over time as planned.   Your activity level is not improving as expected.  SEEK IMMEDIATE MEDICAL CARE IF:   You develop any bleeding, stomach upset, or signs of an allergic reaction to your medicine.   Your symptoms get worse.   You develop new, unexplained symptoms.   You have numbness, tingling, weakness, or paralysis in any part of your body.  MAKE SURE YOU:   Understand these instructions.   Will watch your condition.   Will get help right away if you are not doing well or get worse.  Document Released: 04/15/2007 Document Revised: 06/07/2011 Document Reviewed: 03/21/2011 [facility] Patient Information 2012 [place], [place].

## 2011-11-07 ENCOUNTER — Telehealth: Payer: Self-pay

## 2011-11-07 NOTE — Telephone Encounter (Signed)
Patient saw Dr. Alwyn Ren last week and forgot to ask him for a refill on his lorazopam.  Can we please call this in for him at [facility] on [address]

## 2011-11-08 MED ORDER — LORAZEPAM 1 MG PO TABS: ORAL_TABLET | ORAL | Status: DC

## 2011-11-08 NOTE — Telephone Encounter (Signed)
No mention of him being on this in epic. Please pull paper chart.

## 2011-11-08 NOTE — Telephone Encounter (Signed)
[id number] is in [place] phone message stack

## 2011-11-08 NOTE — Telephone Encounter (Signed)
Rx called in and print out shredded.  Patient notified.

## 2011-11-08 NOTE — Telephone Encounter (Signed)
Done, printed, and signed.

## 2011-12-09 ENCOUNTER — Encounter (HOSPITAL_COMMUNITY): Payer: Self-pay | Admitting: Emergency Medicine

## 2011-12-09 ENCOUNTER — Emergency Department (HOSPITAL_COMMUNITY)
Admission: EM | Admit: 2011-12-09 | Discharge: 2011-12-09 | Disposition: A | Discharge status: Home / Self Care | Payer: Medicare Other | Attending: Emergency Medicine | Admitting: Emergency Medicine

## 2011-12-09 DIAGNOSIS — L0291 Cutaneous abscess, unspecified: Secondary | ICD-10-CM

## 2011-12-09 DIAGNOSIS — Z79899 Other long term (current) drug therapy: Secondary | ICD-10-CM | POA: Insufficient documentation

## 2011-12-09 DIAGNOSIS — E119 Type 2 diabetes mellitus without complications: Secondary | ICD-10-CM | POA: Insufficient documentation

## 2011-12-09 DIAGNOSIS — Z794 Long term (current) use of insulin: Secondary | ICD-10-CM | POA: Insufficient documentation

## 2011-12-09 DIAGNOSIS — L02419 Cutaneous abscess of limb, unspecified: Secondary | ICD-10-CM | POA: Insufficient documentation

## 2011-12-09 DIAGNOSIS — I10 Essential (primary) hypertension: Secondary | ICD-10-CM | POA: Insufficient documentation

## 2011-12-09 DIAGNOSIS — E785 Hyperlipidemia, unspecified: Secondary | ICD-10-CM | POA: Insufficient documentation

## 2011-12-09 DIAGNOSIS — F17200 Nicotine dependence, unspecified, uncomplicated: Secondary | ICD-10-CM | POA: Insufficient documentation

## 2011-12-09 MED ORDER — DOXYCYCLINE HYCLATE 100 MG PO TABS: 100.0000 mg | ORAL_TABLET | Freq: Two times a day (BID) | ORAL | Status: AC

## 2011-12-09 MED ORDER — OXYCODONE-ACETAMINOPHEN 5-325 MG PO TABS: 1.0000 | ORAL_TABLET | ORAL | Status: AC | PRN

## 2011-12-09 NOTE — Discharge Instructions (Signed)
Soak in warm water 3 times a day for 30 minutes until the wound heals. Clean the area well with soap and water at least twice a day. Followup with your doctor this week for a checkup.   Abscess An abscess (boil or furuncle) is an infected area that contains a collection of pus.  SYMPTOMS Signs and symptoms of an abscess include pain, tenderness, redness, or hardness. You may feel a moveable soft area under your skin. An abscess can occur anywhere in the body.  TREATMENT  A surgical cut (incision) may be made over your abscess to drain the pus. Gauze may be packed into the space or a drain may be looped through the abscess cavity (pocket). This provides a drain that will allow the cavity to heal from the inside outwards. The abscess may be painful for a few days, but should feel much better if it was drained.  Your abscess, if seen early, may not have localized and may not have been drained. If not, another appointment may be required if it does not get better on its own or with medications. HOME CARE INSTRUCTIONS   Only take over-the-counter or prescription medicines for pain, discomfort, or fever as directed by your caregiver.   Take your antibiotics as directed if they were prescribed. Finish them even if you start to feel better.   Keep the skin and clothes clean around your abscess.   If the abscess was drained, you will need to use gauze dressing to collect any draining pus. Dressings will typically need to be changed 3 or more times a day.   The infection may spread by skin contact with others. Avoid skin contact as much as possible.   Practice good hygiene. This includes regular hand washing, cover any draining skin lesions, and do not share personal care items.   If you participate in sports, do not share athletic equipment, towels, whirlpools, or personal care items. Shower after every practice or tournament.   If a draining area cannot be adequately covered:   Do not  participate in sports.   Children should not participate in day care until the wound has healed or drainage stops.   If your caregiver has given you a follow-up appointment, it is very important to keep that appointment. Not keeping the appointment could result in a much worse infection, chronic or permanent injury, pain, and disability. If there is any problem keeping the appointment, you must call back to this facility for assistance.  SEEK MEDICAL CARE IF:   You develop increased pain, swelling, redness, drainage, or bleeding in the wound site.   You develop signs of generalized infection including muscle aches, chills, fever, or a general ill feeling.   You have an oral temperature above 102 F (38.9 C).  MAKE SURE YOU:   Understand these instructions.   Will watch your condition.   Will get help right away if you are not doing well or get worse.  Document Released: 03/28/2005 Document Revised: 06/07/2011 Document Reviewed: 01/20/2008 [facility] Patient Information 2012 [place], [place].

## 2011-12-09 NOTE — ED Provider Notes (Signed)
History   This chart was scribed for Flint Melter, MD scribed by Magnus Sinning. The patient was seen in room STRE2/STRE2 seen at 17:34.    CSN: [id number]  Arrival date & time 12/09/11  1516   First MD Initiated Contact with Patient 12/09/11 1705      Chief Complaint  Patient presents with  . Abscess    (Consider location/radiation/quality/duration/timing/severity/associated sxs/prior treatment) HPI Derek Calderon is a 61 y.o. male who presents to the Emergency Department complaining of gradually worsening abscess on his left leg with associated constant moderate left leg pain, onset one week. Patient says that it worsened yesterday and that he has a h/o cysts, but states that it has never been similar to current abscess. He says that he overall " doesn't feel good," but has been able to eat and does not report fevers, n/v/d. Past Medical History  Diagnosis Date  . Lymphocytosis   . Mediastinal lymphadenopathy   . Diabetes mellitus   . Hyperlipidemia   . HTN (hypertension)     Past Surgical History  Procedure Date  . Knee surgery 11-01-2010    right  . Carpal tunnel release 08-2009    right    Family History  Problem Relation Age of Onset  . Allergies Sister   . Cancer Mother     stomach  . Pancreatic cancer Father     History  Substance Use Topics  . Smoking status: Current Everyday Smoker -- 0.5 packs/day for 15 years    Types: Cigarettes  . Smokeless tobacco: Not on file  . Alcohol Use: No     quit in 2005      Review of Systems 10 Systems reviewed and are negative for acute change except as noted in the HPI. Allergies  Eszopiclone and Neosporin  Home Medications   Current Outpatient Rx  Name Route Sig Dispense Refill  . ALBUTEROL SULFATE HFA 108 (90 BASE) MCG/ACT IN AERS Inhalation Inhale 2 puffs into the lungs every 6 (six) hours as needed. For shortness of breath    . ATORVASTATIN CALCIUM 10 MG PO TABS Oral Take 10 mg by mouth daily.      .  BECLOMETHASONE DIPROPIONATE 40 MCG/ACT IN AERS Inhalation Inhale 1 puff into the lungs 2 (two) times daily.    . BUPROPION HCL ER (SR) 150 MG PO TB12 Oral Take 1 tablet (150 mg total) by mouth 2 (two) times daily. 60 tablet 3  . INSULIN ASPART 100 UNIT/ML [place] SOLN Subcutaneous Inject 20-25 Units into the skin 3 (three) times daily before meals. Per sliding scale    . INSULIN GLARGINE 100 UNIT/ML [place] SOLN Subcutaneous Inject 34 Units into the skin at bedtime.     . IPRATROPIUM BROMIDE 0.03 % NA SOLN Nasal Place 2 sprays into the nose every 12 (twelve) hours. 30 mL 1  . LEVOTHYROXINE SODIUM 50 MCG PO TABS Oral Take 50 mcg by mouth daily.      Marland Kitchen LORAZEPAM 1 MG PO TABS Sublingual Place 1 mg under the tongue every 8 (eight) hours as needed. For anxiety    . METFORMIN HCL 1000 MG PO TABS Oral Take 1,000 mg by mouth 2 (two) times daily with a meal.     . METOPROLOL SUCCINATE ER 50 MG PO TB24 Oral Take 50 mg by mouth 2 (two) times daily. Take with or immediately following a meal.    . PAROXETINE HCL 40 MG PO TABS Oral Take 40 mg by mouth every morning.    Marland Kitchen  TESTOSTERONE 50 MG/5GM TD GEL Transdermal Place 5 g onto the skin daily.    Marland Kitchen DOXYCYCLINE HYCLATE 100 MG PO TABS Oral Take 1 tablet (100 mg total) by mouth 2 (two) times daily. 20 tablet 0  . OXYCODONE-ACETAMINOPHEN 5-325 MG PO TABS Oral Take 1 tablet by mouth every 4 (four) hours as needed for pain. 15 tablet 0    BP 137/96  Pulse 90  Temp(Src) 98.2 F (36.8 C) (Oral)  Resp 20  SpO2 94%  Physical Exam  Nursing note and vitals reviewed. Constitutional: He is oriented to person, place, and time. He appears well-developed and well-nourished. No distress.  HENT:  Head: Normocephalic and atraumatic.  Eyes: Conjunctivae and EOM are normal.  Neck: Neck supple. No tracheal deviation present.  Cardiovascular: Normal rate.   Pulmonary/Chest: Effort normal. No respiratory distress.  Musculoskeletal: Normal range of motion.  Neurological: He is alert  and oriented to person, place, and time.  Skin: Skin is warm and dry.       3 cm red raise fluctuant area on left posterior lateral thigh with proximal streaking.  Psychiatric: He has a normal mood and affect. His behavior is normal.    ED Course  Procedures (including critical care time) DIAGNOSTIC STUDIES: Oxygen Saturation is 94% on room air, adequate by my interpretation.    COORDINATION OF CARE: Labs Reviewed - No data to display No results found.  INCISION AND DRAINAGE Performed by: Flint Melter Consent: Verbal consent obtained. Risks and benefits: risks, benefits and alternatives were discussed Type: abscess  Body area: left thigh  Anesthesia: local infiltration  Local anesthetic: lidocaine 2% without epinephrine  Anesthetic total: 4 ml  Complexity: complex Blunt dissection to break up loculations  Drainage: purulent  Drainage amount: moderate  Packing material: 1/4 in iodoform gauze  Patient tolerance: Patient tolerated the procedure well with no immediate complications.      1. Abscess       MDM  Simple abscess, in diabetic, no associated cellulitis. Doubt metabolic instability, serious bacterial infection or impending vascular collapse; the patient is stable for discharge.     I personally performed the services described in this documentation, which was scribed in my presence. The recorded information has been reviewed and considered.    Plan: Home Medications- Percocet and Doxycycline; Home Treatments- warm soaks; Recommended follow up- PCP f/u in 3 days       Flint Melter, MD 12/09/11 [phone]

## 2011-12-09 NOTE — ED Notes (Signed)
Bandaged pt's abscess before pt left for discharge

## 2011-12-09 NOTE — ED Notes (Signed)
C/o abscess on L posterior leg that has been bothering him since yesterday.

## 2011-12-10 ENCOUNTER — Telehealth: Payer: Self-pay

## 2011-12-10 ENCOUNTER — Other Ambulatory Visit: Payer: Self-pay

## 2011-12-10 MED ORDER — LORAZEPAM 1 MG PO TABS: 1.0000 mg | ORAL_TABLET | Freq: Three times a day (TID) | ORAL | Status: DC | PRN

## 2011-12-10 NOTE — Telephone Encounter (Signed)
Patient states that Lorazepam has not been filled. Other meds (from cyst removed at ER) were filled today. Still waiting for anxiety meds though.

## 2011-12-10 NOTE — Telephone Encounter (Signed)
Done and signed at desk 

## 2011-12-10 NOTE — Telephone Encounter (Signed)
Looks like this was done already yesterday. Please call to verify.

## 2011-12-10 NOTE — Telephone Encounter (Signed)
Looks like we last filled this on 5/9.  Can we rf?

## 2011-12-10 NOTE — Telephone Encounter (Signed)
LORazepam (ATIVAN) 1 MG tablet  REFILL PLEASE

## 2011-12-11 NOTE — Telephone Encounter (Signed)
Message made in error

## 2011-12-11 NOTE — Telephone Encounter (Signed)
Faxed Rx to [facility] w/confirmation. Notified pt.

## 2012-01-06 ENCOUNTER — Encounter (HOSPITAL_COMMUNITY): Payer: Self-pay | Admitting: Emergency Medicine

## 2012-01-06 ENCOUNTER — Emergency Department (HOSPITAL_COMMUNITY): Payer: Medicare Other

## 2012-01-06 ENCOUNTER — Emergency Department (HOSPITAL_COMMUNITY)
Admission: EM | Admit: 2012-01-06 | Discharge: 2012-01-06 | Disposition: A | Discharge status: Home / Self Care | Payer: Medicare Other | Attending: Emergency Medicine | Admitting: Emergency Medicine

## 2012-01-06 DIAGNOSIS — L089 Local infection of the skin and subcutaneous tissue, unspecified: Secondary | ICD-10-CM

## 2012-01-06 DIAGNOSIS — L02619 Cutaneous abscess of unspecified foot: Secondary | ICD-10-CM | POA: Insufficient documentation

## 2012-01-06 DIAGNOSIS — F17200 Nicotine dependence, unspecified, uncomplicated: Secondary | ICD-10-CM | POA: Insufficient documentation

## 2012-01-06 DIAGNOSIS — E119 Type 2 diabetes mellitus without complications: Secondary | ICD-10-CM | POA: Insufficient documentation

## 2012-01-06 DIAGNOSIS — E785 Hyperlipidemia, unspecified: Secondary | ICD-10-CM | POA: Insufficient documentation

## 2012-01-06 DIAGNOSIS — Z800 Family history of malignant neoplasm of digestive organs: Secondary | ICD-10-CM | POA: Insufficient documentation

## 2012-01-06 DIAGNOSIS — L03039 Cellulitis of unspecified toe: Secondary | ICD-10-CM | POA: Insufficient documentation

## 2012-01-06 DIAGNOSIS — I10 Essential (primary) hypertension: Secondary | ICD-10-CM | POA: Insufficient documentation

## 2012-01-06 MED ORDER — HYDROCODONE-ACETAMINOPHEN 5-325 MG PO TABS
1.0000 | ORAL_TABLET | Freq: Once | ORAL | Status: AC
  Administered 2012-01-06: 1 via ORAL
  Filled 2012-01-06: qty 1

## 2012-01-06 MED ORDER — CEPHALEXIN 250 MG PO CAPS
500.0000 mg | ORAL_CAPSULE | Freq: Once | ORAL | Status: AC
  Administered 2012-01-06: 500 mg via ORAL
  Filled 2012-01-06: qty 2

## 2012-01-06 MED ORDER — CEPHALEXIN 500 MG PO CAPS: 500.0000 mg | ORAL_CAPSULE | Freq: Three times a day (TID) | ORAL | Status: AC

## 2012-01-06 MED ORDER — HYDROCODONE-ACETAMINOPHEN 5-325 MG PO TABS: 1.0000 | ORAL_TABLET | Freq: Four times a day (QID) | ORAL | Status: AC | PRN

## 2012-01-06 NOTE — ED Provider Notes (Signed)
Medical screening examination/treatment/procedure(s) were performed by non-physician practitioner and as supervising physician I was immediately available for consultation/collaboration.   Demetric Dunnaway B. Mathea Frieling, MD 01/06/12 2211 

## 2012-01-06 NOTE — ED Provider Notes (Signed)
History     CSN: [id number]  Arrival date & time 01/06/12  4098   First MD Initiated Contact with Patient 01/06/12 2148      Chief Complaint  Patient presents with  . Abscess    (Consider location/radiation/quality/duration/timing/severity/associated sxs/prior treatment) HPI Comments: Patient with a thick calleuos on bottom of R great toes that blistered after a long walk last week now the skin is broken open it is draining fluid, the toe has become swollen and tender The swelling is improving,  His blood sugars have been normal He had as appointment with his PCP this Friday   Patient is a 61 y.o. male presenting with abscess. The history is provided by the patient.  Abscess  This is a new problem. The current episode started more than one week ago. The problem occurs continuously. The problem has been gradually worsening. The abscess is present on the right foot. The problem is moderate. The abscess is characterized by painfulness. Pertinent negatives include no fever.    Past Medical History  Diagnosis Date  . Lymphocytosis   . Mediastinal lymphadenopathy   . Diabetes mellitus   . Hyperlipidemia   . HTN (hypertension)     Past Surgical History  Procedure Date  . Carpal tunnel release 08-2009    right    Family History  Problem Relation Age of Onset  . Allergies Sister   . Cancer Mother     stomach  . Pancreatic cancer Father     History  Substance Use Topics  . Smoking status: Current Everyday Smoker -- 0.5 packs/day for 15 years    Types: Cigarettes  . Smokeless tobacco: Not on file  . Alcohol Use: No     quit in 2005      Review of Systems  Constitutional: Negative for fever and chills.  Gastrointestinal: Negative for nausea.  Musculoskeletal: Negative for joint swelling.  Skin: Positive for wound.  Neurological: Negative for dizziness and headaches.    Allergies  Eszopiclone and Neosporin  Home Medications   Current Outpatient Rx  Name Route  Sig Dispense Refill  . ALBUTEROL SULFATE HFA 108 (90 BASE) MCG/ACT IN AERS Inhalation Inhale 2 puffs into the lungs every 6 (six) hours as needed. For shortness of breath    . ATORVASTATIN CALCIUM 10 MG PO TABS Oral Take 10 mg by mouth daily.      . BUPROPION HCL ER (SR) 150 MG PO TB12 Oral Take 1 tablet (150 mg total) by mouth 2 (two) times daily. 60 tablet 3  . INSULIN ASPART 100 UNIT/ML [place] SOLN Subcutaneous Inject 20-25 Units into the skin 3 (three) times daily before meals. Per sliding scale    . INSULIN GLARGINE 100 UNIT/ML [place] SOLN Subcutaneous Inject 34 Units into the skin at bedtime.     . IPRATROPIUM BROMIDE 0.03 % NA SOLN Nasal Place 2 sprays into the nose every 12 (twelve) hours. 30 mL 1  . LEVOTHYROXINE SODIUM 50 MCG PO TABS Oral Take 50 mcg by mouth daily.      Marland Kitchen LORAZEPAM 1 MG PO TABS Sublingual Place 1 tablet (1 mg total) under the tongue every 8 (eight) hours as needed. For anxiety 30 tablet 0  . METFORMIN HCL 1000 MG PO TABS Oral Take 1,000 mg by mouth 2 (two) times daily with a meal.     . METOPROLOL SUCCINATE ER 50 MG PO TB24 Oral Take 50 mg by mouth 2 (two) times daily. Take with or immediately following a meal.    .  PAROXETINE HCL 40 MG PO TABS Oral Take 40 mg by mouth every morning.    . TESTOSTERONE 50 MG/5GM TD GEL Transdermal Place 5 g onto the skin daily.      BP 140/86  Pulse 86  Temp 98.4 F (36.9 C) (Oral)  Resp 20  SpO2 98%  Physical Exam  Constitutional: He appears well-developed and well-nourished.  Eyes: Pupils are equal, round, and reactive to light.  Neck: Normal range of motion.  Cardiovascular: Normal rate.   Pulmonary/Chest: He is in respiratory distress.  Musculoskeletal:       Base of R great toe thick callous that has splint open had swelling but now resolved    Neurological: He is alert.  Skin: Skin is warm.    ED Course  Procedures (including critical care time)  Labs Reviewed - No data to display Dg Toe Great Right  01/06/2012   *RADIOLOGY REPORT*  Clinical Data: Abscess  RIGHT GREAT TOE  Comparison: None.  Findings: There are lytic areas within the tuft of the distal phalanx of the great toe.  No evidence of periosteal reaction.  No cortical breakthrough.  The no soft tissue swelling.  No acute fracture and no dislocation.  IMPRESSION: Chronic changes.  No acute bony pathology.  Original Report Authenticated By: Donavan Burnet, M.D.     No diagnosis found.    MDM  Will start on antibiotics, xray to evaluate for osteo and have patient follow up with PCP as scheduled on Friday         Arman Filter, NP 01/06/12 2154

## 2012-01-06 NOTE — ED Notes (Signed)
Pt reports noticed blister to L great toe about a week ago; denies fevers- but does reports having "sweats"; appears to have opened on its own- pt denies any drainage;

## 2012-01-08 ENCOUNTER — Telehealth: Payer: Self-pay

## 2012-01-08 MED ORDER — LORAZEPAM 1 MG PO TABS: 1.0000 mg | ORAL_TABLET | Freq: Three times a day (TID) | ORAL | Status: DC | PRN

## 2012-01-08 NOTE — Telephone Encounter (Signed)
It looks like this was last RFd on 12/10/11. Can we RF?

## 2012-01-08 NOTE — Telephone Encounter (Signed)
Rx faxed in and patient notified. 

## 2012-01-08 NOTE — Telephone Encounter (Signed)
Done and printed

## 2012-01-08 NOTE — Telephone Encounter (Signed)
The patient called to request refill of his Lorazepam 1 mg Rx.  The patient stated he is experiencing high anxiety due to today being the anniversary of some personal things that happened to him.  Please call the patient at [phone].

## 2012-01-08 NOTE — Telephone Encounter (Signed)
Patient called back and states really needs rx this evening [facility]

## 2012-01-11 ENCOUNTER — Other Ambulatory Visit: Payer: Self-pay | Admitting: Physician Assistant

## 2012-01-18 ENCOUNTER — Emergency Department (HOSPITAL_COMMUNITY): Payer: Medicare Other

## 2012-01-18 ENCOUNTER — Encounter (HOSPITAL_COMMUNITY): Payer: Self-pay | Admitting: Emergency Medicine

## 2012-01-18 ENCOUNTER — Emergency Department (HOSPITAL_COMMUNITY)
Admission: EM | Admit: 2012-01-18 | Discharge: 2012-01-18 | Disposition: A | Discharge status: Home / Self Care | Payer: Medicare Other | Attending: Emergency Medicine | Admitting: Emergency Medicine

## 2012-01-18 DIAGNOSIS — E785 Hyperlipidemia, unspecified: Secondary | ICD-10-CM | POA: Insufficient documentation

## 2012-01-18 DIAGNOSIS — E119 Type 2 diabetes mellitus without complications: Secondary | ICD-10-CM | POA: Insufficient documentation

## 2012-01-18 DIAGNOSIS — Z79899 Other long term (current) drug therapy: Secondary | ICD-10-CM | POA: Insufficient documentation

## 2012-01-18 DIAGNOSIS — F17200 Nicotine dependence, unspecified, uncomplicated: Secondary | ICD-10-CM | POA: Insufficient documentation

## 2012-01-18 DIAGNOSIS — Z794 Long term (current) use of insulin: Secondary | ICD-10-CM | POA: Insufficient documentation

## 2012-01-18 DIAGNOSIS — S82409A Unspecified fracture of shaft of unspecified fibula, initial encounter for closed fracture: Secondary | ICD-10-CM | POA: Insufficient documentation

## 2012-01-18 DIAGNOSIS — I10 Essential (primary) hypertension: Secondary | ICD-10-CM | POA: Insufficient documentation

## 2012-01-18 DIAGNOSIS — Y92009 Unspecified place in unspecified non-institutional (private) residence as the place of occurrence of the external cause: Secondary | ICD-10-CM | POA: Insufficient documentation

## 2012-01-18 DIAGNOSIS — W1789XA Other fall from one level to another, initial encounter: Secondary | ICD-10-CM | POA: Insufficient documentation

## 2012-01-18 MED ORDER — OXYCODONE-ACETAMINOPHEN 5-325 MG PO TABS: 1.0000 | ORAL_TABLET | Freq: Four times a day (QID) | ORAL | Status: AC | PRN

## 2012-01-18 MED ORDER — OXYCODONE-ACETAMINOPHEN 5-325 MG PO TABS
2.0000 | ORAL_TABLET | Freq: Once | ORAL | Status: AC
  Administered 2012-01-18: 2 via ORAL
  Filled 2012-01-18: qty 2

## 2012-01-18 NOTE — ED Notes (Signed)
Ortho tech paged  

## 2012-01-18 NOTE — ED Provider Notes (Signed)
History     CSN: [id number]  Arrival date & time 01/18/12  2012   First MD Initiated Contact with Patient 01/18/12 2227      Chief Complaint  Patient presents with  . Ankle Pain    (Consider location/radiation/quality/duration/timing/severity/associated sxs/prior treatment) HPI Comments: Patient states he was working in his yard stepped in a hole, falling and twisting.  His right ankle.  He has pain at the ankle and up near his knee.  This happened approximately one hour before he reported to the emergency department for evaluation.  Has not taken any over-the-counter medication  Patient is a 61 y.o. male presenting with ankle pain. The history is provided by the patient.  Ankle Pain  The incident occurred 3 to 5 hours ago. The incident occurred at home. The injury mechanism was a fall. The pain is present in the right ankle. Pertinent negatives include no numbness.    Past Medical History  Diagnosis Date  . Lymphocytosis   . Mediastinal lymphadenopathy   . Diabetes mellitus   . Hyperlipidemia   . HTN (hypertension)     Past Surgical History  Procedure Date  . Carpal tunnel release 08-2009    right    Family History  Problem Relation Age of Onset  . Allergies Sister   . Cancer Mother     stomach  . Pancreatic cancer Father     History  Substance Use Topics  . Smoking status: Current Everyday Smoker -- 0.5 packs/day for 15 years    Types: Cigarettes  . Smokeless tobacco: Not on file  . Alcohol Use: No     quit in 2005      Review of Systems  Constitutional: Negative for fever.  Musculoskeletal: Positive for joint swelling and gait problem.  Neurological: Negative for dizziness, weakness and numbness.    Allergies  Eszopiclone and Neosporin  Home Medications   Current Outpatient Rx  Name Route Sig Dispense Refill  . ALBUTEROL SULFATE HFA 108 (90 BASE) MCG/ACT IN AERS Inhalation Inhale 2 puffs into the lungs every 6 (six) hours as needed. For shortness  of breath    . ATORVASTATIN CALCIUM 10 MG PO TABS Oral Take 10 mg by mouth daily.      . BUPROPION HCL ER (SR) 150 MG PO TB12 Oral Take 1 tablet (150 mg total) by mouth 2 (two) times daily. 60 tablet 3  . INSULIN ASPART 100 UNIT/ML [place] SOLN Subcutaneous Inject 20-25 Units into the skin 3 (three) times daily before meals. Per sliding scale    . INSULIN GLARGINE 100 UNIT/ML  SOLN Subcutaneous Inject 34 Units into the skin at bedtime.     . IPRATROPIUM BROMIDE 0.03 % NA SOLN Nasal Place 2 sprays into the nose every 12 (twelve) hours. 30 mL 1  . LEVOTHYROXINE SODIUM 50 MCG PO TABS Oral Take 50 mcg by mouth daily.      Marland Kitchen LORAZEPAM 1 MG PO TABS Sublingual Place 1 tablet (1 mg total) under the tongue every 8 (eight) hours as needed. For anxiety 30 tablet 0  . METFORMIN HCL 1000 MG PO TABS Oral Take 1,000 mg by mouth 2 (two) times daily with a meal.     . METOPROLOL SUCCINATE ER 50 MG PO TB24 Oral Take 50 mg by mouth 2 (two) times daily. Take with or immediately following a meal.    . PAROXETINE HCL 40 MG PO TABS Oral Take 40 mg by mouth every morning.    . TESTOSTERONE 50  MG/5GM TD GEL Transdermal Place 5 g onto the skin daily.      BP 121/76  Pulse 81  Temp 97.5 F (36.4 C) (Oral)  Resp 16  SpO2 98%  Physical Exam  Constitutional: He appears well-developed and well-nourished.  HENT:  Head: Normocephalic.  Eyes: Pupils are equal, round, and reactive to light.  Neck: Normal range of motion.  Cardiovascular: Normal rate.   Pulmonary/Chest: Effort normal.  Musculoskeletal: He exhibits edema and tenderness.       Feet:  Neurological: He is alert.  Skin: Skin is warm. No erythema.    ED Course  Procedures (including critical care time)  Labs Reviewed - No data to display Dg Tibia/fibula Right  01/18/2012  *RADIOLOGY REPORT*  Clinical Data: Ankle pain  RIGHT TIBIA AND FIBULA - 2 VIEW  Comparison: None.  Findings: Minimally displaced distal fibular fracture.  No additional fracture is  seen.  Moderate tricompartmental degenerative changes at the knee. Associated chondrocalcinosis.  IMPRESSION: Minimally displaced distal fibular fracture.  Original Report Authenticated By: Charline Bills, M.D.   Dg Ankle Complete Right  01/18/2012  *RADIOLOGY REPORT*  Clinical Data: Right ankle pain/injury  RIGHT ANKLE - COMPLETE 3+ VIEW  Comparison: None.  Findings: Mildly displaced oblique distal fibular fracture.  No additional fracture is seen.  The base of the fifth metatarsal is unremarkable.  Plantar and posterior calcaneal enthesophytes.  Moderate lateral soft tissue swelling.  IMPRESSION: Mild displaced oblique distal fibular fracture.  Moderate lateral soft tissue swelling.  Original Report Authenticated By: Charline Bills, M.D.     No diagnosis found.    MDM   Review of x-ray reveals a nondisplaced distal fibula fracture.  I did ask for an x-ray of the entire tib-fib area and there is no further fractures noted about the proximal area.  Patient was placed in a Cam Walker and allow to followup with his orthopedist, at sports medicine.  Next week        Arman Filter, NP 01/18/12 2235

## 2012-01-18 NOTE — ED Provider Notes (Signed)
Medical screening examination/treatment/procedure(s) were performed by non-physician practitioner and as supervising physician I was immediately available for consultation/collaboration.   Adonai Selsor, MD 01/18/12 2350 

## 2012-01-18 NOTE — ED Notes (Signed)
Pt ambulated with a steady gait;VSS; A&Ox3; no signs of distress; respirations even and unlabored; skin warm and dry; no questions at this time.  

## 2012-01-18 NOTE — ED Notes (Signed)
Patient transported to X-ray 

## 2012-01-18 NOTE — ED Notes (Signed)
Ortho tech at bedside 

## 2012-01-18 NOTE — Progress Notes (Signed)
Orthopedic Tech Progress Note Patient Details:  Derek Calderon 1950/11/02 [id number]  Ortho Devices Type of Ortho Device: CAM walker Ortho Device/Splint Location: (R) LE Ortho Device/Splint Interventions: Application   Jennye Moccasin 01/18/2012, 10:26 PM

## 2012-01-18 NOTE — ED Notes (Signed)
Pt sts that he was in his yard when he was ambulating and he tripped and and twisted his R ankle.

## 2012-01-24 ENCOUNTER — Other Ambulatory Visit: Payer: Self-pay | Admitting: Orthopedic Surgery

## 2012-01-30 ENCOUNTER — Encounter (HOSPITAL_BASED_OUTPATIENT_CLINIC_OR_DEPARTMENT_OTHER): Payer: Self-pay | Admitting: *Deleted

## 2012-01-30 NOTE — Progress Notes (Signed)
To come in for bmet-ekg-denies OSA-or current resp problems

## 2012-01-31 ENCOUNTER — Ambulatory Visit (HOSPITAL_BASED_OUTPATIENT_CLINIC_OR_DEPARTMENT_OTHER)
Admission: RE | Admit: 2012-01-31 | Discharge: 2012-01-31 | Disposition: A | Discharge status: Home / Self Care | Payer: Medicare Other | Source: Ambulatory Visit | Attending: Orthopedic Surgery | Admitting: Orthopedic Surgery

## 2012-01-31 LAB — BASIC METABOLIC PANEL
BUN: 17 mg/dL (ref 6–23)
CO2: 22 mEq/L (ref 19–32)
Chloride: 101 mEq/L (ref 96–112)
Creatinine, Ser: 0.78 mg/dL (ref 0.50–1.35)
Glucose, Bld: 356 mg/dL — ABNORMAL HIGH (ref 70–99)

## 2012-01-31 NOTE — Progress Notes (Signed)
Unable to find any recent ekg per pt from dr hopper or er,  Last in 5/12. Called pt and will come in early for ekg.

## 2012-02-01 ENCOUNTER — Encounter (HOSPITAL_BASED_OUTPATIENT_CLINIC_OR_DEPARTMENT_OTHER): Payer: Self-pay | Admitting: *Deleted

## 2012-02-01 ENCOUNTER — Encounter (HOSPITAL_BASED_OUTPATIENT_CLINIC_OR_DEPARTMENT_OTHER)
Admission: RE | Disposition: A | Discharge status: Home / Self Care | Payer: Self-pay | Source: Ambulatory Visit | Attending: Orthopedic Surgery

## 2012-02-01 ENCOUNTER — Ambulatory Visit (HOSPITAL_BASED_OUTPATIENT_CLINIC_OR_DEPARTMENT_OTHER): Payer: Medicare Other | Admitting: Certified Registered Nurse Anesthetist

## 2012-02-01 ENCOUNTER — Other Ambulatory Visit: Payer: Self-pay

## 2012-02-01 ENCOUNTER — Encounter (HOSPITAL_BASED_OUTPATIENT_CLINIC_OR_DEPARTMENT_OTHER): Payer: Self-pay | Admitting: Certified Registered Nurse Anesthetist

## 2012-02-01 ENCOUNTER — Ambulatory Visit (HOSPITAL_BASED_OUTPATIENT_CLINIC_OR_DEPARTMENT_OTHER)
Admission: RE | Admit: 2012-02-01 | Discharge: 2012-02-01 | Disposition: A | Discharge status: Home / Self Care | Payer: Medicare Other | Source: Ambulatory Visit | Attending: Orthopedic Surgery | Admitting: Orthopedic Surgery

## 2012-02-01 ENCOUNTER — Encounter (HOSPITAL_BASED_OUTPATIENT_CLINIC_OR_DEPARTMENT_OTHER): Payer: Self-pay | Admitting: Orthopedic Surgery

## 2012-02-01 DIAGNOSIS — W172XXA Fall into hole, initial encounter: Secondary | ICD-10-CM | POA: Insufficient documentation

## 2012-02-01 DIAGNOSIS — I10 Essential (primary) hypertension: Secondary | ICD-10-CM | POA: Insufficient documentation

## 2012-02-01 DIAGNOSIS — E119 Type 2 diabetes mellitus without complications: Secondary | ICD-10-CM | POA: Insufficient documentation

## 2012-02-01 DIAGNOSIS — F17200 Nicotine dependence, unspecified, uncomplicated: Secondary | ICD-10-CM | POA: Insufficient documentation

## 2012-02-01 DIAGNOSIS — Y92009 Unspecified place in unspecified non-institutional (private) residence as the place of occurrence of the external cause: Secondary | ICD-10-CM | POA: Insufficient documentation

## 2012-02-01 DIAGNOSIS — J449 Chronic obstructive pulmonary disease, unspecified: Secondary | ICD-10-CM | POA: Insufficient documentation

## 2012-02-01 DIAGNOSIS — S82831A Other fracture of upper and lower end of right fibula, initial encounter for closed fracture: Secondary | ICD-10-CM | POA: Diagnosis present

## 2012-02-01 DIAGNOSIS — S8263XA Displaced fracture of lateral malleolus of unspecified fibula, initial encounter for closed fracture: Secondary | ICD-10-CM | POA: Insufficient documentation

## 2012-02-01 DIAGNOSIS — Y998 Other external cause status: Secondary | ICD-10-CM | POA: Insufficient documentation

## 2012-02-01 DIAGNOSIS — J4489 Other specified chronic obstructive pulmonary disease: Secondary | ICD-10-CM | POA: Insufficient documentation

## 2012-02-01 DIAGNOSIS — Z794 Long term (current) use of insulin: Secondary | ICD-10-CM | POA: Insufficient documentation

## 2012-02-01 HISTORY — DX: Depression, unspecified: F32.A

## 2012-02-01 HISTORY — DX: Unspecified asthma, uncomplicated: J45.909

## 2012-02-01 HISTORY — DX: Other fracture of upper and lower end of right fibula, initial encounter for closed fracture: S82.831A

## 2012-02-01 HISTORY — DX: Major depressive disorder, single episode, unspecified: F32.9

## 2012-02-01 LAB — GLUCOSE, CAPILLARY: Glucose-Capillary: 308 mg/dL — ABNORMAL HIGH (ref 70–99)

## 2012-02-01 SURGERY — OPEN REDUCTION INTERNAL FIXATION (ORIF) TIBIA/FIBULA FRACTURE
Anesthesia: General | Site: Ankle | Laterality: Right | Wound class: Clean

## 2012-02-01 MED ORDER — SUCCINYLCHOLINE CHLORIDE 20 MG/ML IJ SOLN
INTRAMUSCULAR | Status: DC | PRN
  Administered 2012-02-01: 100 mg via INTRAVENOUS

## 2012-02-01 MED ORDER — OXYCODONE-ACETAMINOPHEN 10-325 MG PO TABS: 1.0000 | ORAL_TABLET | Freq: Four times a day (QID) | ORAL | Status: AC | PRN

## 2012-02-01 MED ORDER — PHENYLEPHRINE HCL 10 MG/ML IJ SOLN
INTRAMUSCULAR | Status: DC | PRN
  Administered 2012-02-01 (×2): 40 ug via INTRAVENOUS

## 2012-02-01 MED ORDER — METHOCARBAMOL 500 MG PO TABS: 500.0000 mg | ORAL_TABLET | Freq: Four times a day (QID) | ORAL | Status: AC

## 2012-02-01 MED ORDER — LIDOCAINE HCL (CARDIAC) 20 MG/ML IV SOLN
INTRAVENOUS | Status: DC | PRN
  Administered 2012-02-01: 75 mg via INTRAVENOUS

## 2012-02-01 MED ORDER — PROPOFOL 10 MG/ML IV EMUL
INTRAVENOUS | Status: DC | PRN
  Administered 2012-02-01: 200 mg via INTRAVENOUS

## 2012-02-01 MED ORDER — HYDROMORPHONE HCL PF 1 MG/ML IJ SOLN: 0.2500 mg | INTRAMUSCULAR | Status: DC | PRN

## 2012-02-01 MED ORDER — MIDAZOLAM HCL 2 MG/2ML IJ SOLN: 0.5000 mg | Freq: Once | INTRAMUSCULAR | Status: DC | PRN

## 2012-02-01 MED ORDER — PROMETHAZINE HCL 25 MG PO TABS: 25.0000 mg | ORAL_TABLET | Freq: Four times a day (QID) | ORAL | Status: DC | PRN

## 2012-02-01 MED ORDER — BUPIVACAINE-EPINEPHRINE PF 0.5-1:200000 % IJ SOLN
INTRAMUSCULAR | Status: DC | PRN
  Administered 2012-02-01: 10 mL
  Administered 2012-02-01: 30 mL

## 2012-02-01 MED ORDER — INSULIN REGULAR HUMAN 100 UNIT/ML IJ SOLN
INTRAMUSCULAR | Status: DC | PRN
  Administered 2012-02-01: 5 [IU] via SUBCUTANEOUS

## 2012-02-01 MED ORDER — FENTANYL CITRATE 0.05 MG/ML IJ SOLN
50.0000 ug | INTRAMUSCULAR | Status: DC | PRN
  Administered 2012-02-01 (×2): 100 ug via INTRAVENOUS

## 2012-02-01 MED ORDER — PHENYLEPHRINE HCL 10 MG/ML IJ SOLN
10.0000 mg | INTRAVENOUS | Status: DC | PRN
  Administered 2012-02-01: 40 ug/min via INTRAVENOUS

## 2012-02-01 MED ORDER — FENTANYL CITRATE 0.05 MG/ML IJ SOLN
INTRAMUSCULAR | Status: DC | PRN
  Administered 2012-02-01: 100 ug via INTRAVENOUS

## 2012-02-01 MED ORDER — PROMETHAZINE HCL 25 MG/ML IJ SOLN: 6.2500 mg | INTRAMUSCULAR | Status: DC | PRN

## 2012-02-01 MED ORDER — LACTATED RINGERS IV SOLN
INTRAVENOUS | Status: DC
  Administered 2012-02-01 (×2): via INTRAVENOUS

## 2012-02-01 MED ORDER — MIDAZOLAM HCL 2 MG/2ML IJ SOLN
1.0000 mg | INTRAMUSCULAR | Status: DC | PRN
  Administered 2012-02-01 (×2): 2 mg via INTRAVENOUS

## 2012-02-01 MED ORDER — CEFAZOLIN SODIUM-DEXTROSE 2-3 GM-% IV SOLR
2.0000 g | INTRAVENOUS | Status: AC
  Administered 2012-02-01: 2 g via INTRAVENOUS

## 2012-02-01 MED ORDER — MEPERIDINE HCL 25 MG/ML IJ SOLN: 6.2500 mg | INTRAMUSCULAR | Status: DC | PRN

## 2012-02-01 MED ORDER — 0.9 % SODIUM CHLORIDE (POUR BTL) OPTIME
TOPICAL | Status: DC | PRN
  Administered 2012-02-01: 250 mL

## 2012-02-01 SURGICAL SUPPLY — 75 items
BANDAGE ELASTIC 4 VELCRO ST LF (GAUZE/BANDAGES/DRESSINGS) ×2 IMPLANT
BANDAGE ELASTIC 6 VELCRO ST LF (GAUZE/BANDAGES/DRESSINGS) ×2 IMPLANT
BANDAGE ESMARK 6X9 LF (GAUZE/BANDAGES/DRESSINGS) ×1 IMPLANT
BENZOIN TINCTURE PRP APPL 2/3 (GAUZE/BANDAGES/DRESSINGS) ×2 IMPLANT
BIT DRILL 2.5X110 QC LCP DISP (BIT) ×2 IMPLANT
BIT DRILL QC 3.5X110 (BIT) ×2 IMPLANT
BLADE SURG 15 STRL LF DISP TIS (BLADE) ×3 IMPLANT
BLADE SURG 15 STRL SS (BLADE) ×3
BNDG COHESIVE 4X5 TAN STRL (GAUZE/BANDAGES/DRESSINGS) ×2 IMPLANT
BNDG ESMARK 6X9 LF (GAUZE/BANDAGES/DRESSINGS) ×2
CANISTER SUCTION 1200CC (MISCELLANEOUS) ×2 IMPLANT
CLOTH BEACON ORANGE TIMEOUT ST (SAFETY) ×2 IMPLANT
COVER TABLE BACK 60X90 (DRAPES) ×2 IMPLANT
CUFF TOURNIQUET SINGLE 34IN LL (TOURNIQUET CUFF) ×2 IMPLANT
DECANTER SPIKE VIAL GLASS SM (MISCELLANEOUS) IMPLANT
DRAPE EXTREMITY T 121X128X90 (DRAPE) ×2 IMPLANT
DRAPE INCISE IOBAN 66X45 STRL (DRAPES) ×2 IMPLANT
DRAPE OEC MINIVIEW 54X84 (DRAPES) ×2 IMPLANT
DRAPE U 20/CS (DRAPES) ×2 IMPLANT
DRAPE U-SHAPE 47X51 STRL (DRAPES) ×2 IMPLANT
DRSG PAD ABDOMINAL 8X10 ST (GAUZE/BANDAGES/DRESSINGS) ×2 IMPLANT
DURAPREP 26ML APPLICATOR (WOUND CARE) ×2 IMPLANT
ELECT REM PT RETURN 9FT ADLT (ELECTROSURGICAL) ×2
ELECTRODE REM PT RTRN 9FT ADLT (ELECTROSURGICAL) ×1 IMPLANT
GLOVE BIO SURGEON STRL SZ8 (GLOVE) ×2 IMPLANT
GLOVE BIOGEL PI IND STRL 8 (GLOVE) ×2 IMPLANT
GLOVE BIOGEL PI INDICATOR 8 (GLOVE) ×2
GLOVE ECLIPSE 6.5 STRL STRAW (GLOVE) ×2 IMPLANT
GLOVE ORTHO TXT STRL SZ7.5 (GLOVE) ×2 IMPLANT
GOWN PREVENTION PLUS XLARGE (GOWN DISPOSABLE) ×4 IMPLANT
GOWN PREVENTION PLUS XXLARGE (GOWN DISPOSABLE) ×2 IMPLANT
KIT 1/3 TUB PL 5H 61M (Orthopedic Implant) ×1 IMPLANT
NEEDLE HYPO 25X1 1.5 SAFETY (NEEDLE) IMPLANT
NS IRRIG 1000ML POUR BTL (IV SOLUTION) ×2 IMPLANT
PACK BASIN DAY SURGERY FS (CUSTOM PROCEDURE TRAY) ×2 IMPLANT
PAD CAST 4YDX4 CTTN HI CHSV (CAST SUPPLIES) ×2 IMPLANT
PADDING CAST COTTON 4X4 STRL (CAST SUPPLIES) ×2
PADDING CAST COTTON 6X4 STRL (CAST SUPPLIES) ×2 IMPLANT
PENCIL BUTTON HOLSTER BLD 10FT (ELECTRODE) ×2 IMPLANT
PROS 1/3 TUB PL 5H 61M (Orthopedic Implant) ×2 IMPLANT
SCREW CANC FT ST SFS 4X14 (Screw) ×2 IMPLANT
SCREW CANC FT ST SFS 4X16 (Screw) ×4 IMPLANT
SCREW CORTEX 3.5 14MM (Screw) ×1 IMPLANT
SCREW CORTEX 3.5 16MM (Screw) ×1 IMPLANT
SCREW CORTEX 3.5 26MM (Screw) ×1 IMPLANT
SCREW LOCK CORT ST 3.5X14 (Screw) ×1 IMPLANT
SCREW LOCK CORT ST 3.5X16 (Screw) ×1 IMPLANT
SCREW LOCK CORT ST 3.5X26 (Screw) ×1 IMPLANT
SHEET MEDIUM DRAPE 40X70 STRL (DRAPES) ×2 IMPLANT
SLEEVE SCD COMPRESS KNEE MED (MISCELLANEOUS) ×2 IMPLANT
SPLINT FAST PLASTER 5X30 (CAST SUPPLIES) ×20
SPLINT PLASTER CAST FAST 5X30 (CAST SUPPLIES) ×20 IMPLANT
SPONGE GAUZE 4X4 12PLY (GAUZE/BANDAGES/DRESSINGS) ×2 IMPLANT
SPONGE LAP 4X18 X RAY DECT (DISPOSABLE) ×2 IMPLANT
STAPLER VISISTAT 35W (STAPLE) IMPLANT
STOCKINETTE 6  STRL (DRAPES) ×1
STOCKINETTE 6 STRL (DRAPES) ×1 IMPLANT
STRIP CLOSURE SKIN 1/2X4 (GAUZE/BANDAGES/DRESSINGS) ×2 IMPLANT
SUCTION FRAZIER TIP 10 FR DISP (SUCTIONS) ×2 IMPLANT
SUT ETHILON 3 0 PS 1 (SUTURE) IMPLANT
SUT ETHILON 4 0 PS 2 18 (SUTURE) IMPLANT
SUT MNCRL AB 4-0 PS2 18 (SUTURE) IMPLANT
SUT VIC AB 0 CT1 27 (SUTURE)
SUT VIC AB 0 CT1 27XBRD ANBCTR (SUTURE) IMPLANT
SUT VIC AB 2-0 SH 18 (SUTURE) IMPLANT
SUT VIC AB 3-0 SH 27 (SUTURE)
SUT VIC AB 3-0 SH 27X BRD (SUTURE) IMPLANT
SUT VICRYL 3-0 CR8 SH (SUTURE) ×2 IMPLANT
SUT VICRYL 4-0 PS2 18IN ABS (SUTURE) IMPLANT
SYR BULB 3OZ (MISCELLANEOUS) ×2 IMPLANT
SYR CONTROL 10ML LL (SYRINGE) IMPLANT
TUBE CONNECTING 20X1/4 (TUBING) ×2 IMPLANT
UNDERPAD 30X30 INCONTINENT (UNDERPADS AND DIAPERS) ×2 IMPLANT
WATER STERILE IRR 1000ML POUR (IV SOLUTION) IMPLANT
YANKAUER SUCT BULB TIP NO VENT (SUCTIONS) IMPLANT

## 2012-02-01 NOTE — Progress Notes (Signed)
Assisted Dr. Jackson with right, popliteal/saphenous block. Side rails up, monitors on throughout procedure. See vital signs in flow sheet. Tolerated Procedure well. 

## 2012-02-01 NOTE — Anesthesia Procedure Notes (Addendum)
Anesthesia Regional Block:  Popliteal block  Pre-Anesthetic Checklist: ,, timeout performed, Correct Patient, Correct Site, Correct Laterality, Correct Procedure, Correct Position, site marked, Risks and benefits discussed,  Surgical consent,  Pre-op evaluation,  At surgeon's request and post-op pain management  Laterality: Right  Prep: chloraprep       Needles:  Injection technique: Single-shot  Needle Type: Stimulator Needle - 80      Needle Gauge: 22 and 22 G    Additional Needles:  Procedures: nerve stimulator Popliteal block  Nerve Stimulator or Paresthesia:  Response: toe dorsiflexion, 0.45 mA, 0.1 ms,   Additional Responses:   Narrative:  Start time: 02/01/2012 12:23 PM End time: 02/01/2012 12:29 PM Injection made incrementally with aspirations every 5 mL.  Performed by: Personally  Anesthesiologist: Sandford Craze, MD  Additional Notes: Pt identified in Holding room.  Monitors applied. Working IV access confirmed. Sterile prep R lateral knee.  #22ga PNS to toe dorsiflexion twitch at 0.8mA threshold.  30cc 0.5% Bupivacaine with 1:200k epi injected incrementally after negative test dose.  Re-prep R prox ant-med tibia and 10cc 0.5% Bupivacaine 1:200k epi subq for saphenous nerve supplementation. Patient asymptomatic, VSS, no heme aspirated, tolerated well.   Sandford Craze, MD   Procedure Name: Intubation Date/Time: 02/01/2012 12:52 PM Performed by: Zenia Resides D Pre-anesthesia Checklist: Patient identified, Emergency Drugs available, Suction available, Patient being monitored and Timeout performed Patient Re-evaluated:Patient Re-evaluated prior to inductionOxygen Delivery Method: Circle System Utilized Preoxygenation: Pre-oxygenation with 100% oxygen Intubation Type: IV induction, Cricoid Pressure applied and Rapid sequence Ventilation: Mask ventilation without difficulty Laryngoscope Size: Mac and 3 Grade View: Grade II Tube type: Oral Tube size: 8.0 mm Number of  attempts: 1 Airway Equipment and Method: stylet and oral airway Placement Confirmation: ETT inserted through vocal cords under direct vision,  positive ETCO2 and breath sounds checked- equal and bilateral Secured at: 24 cm Tube secured with: Tape Dental Injury: Teeth and Oropharynx as per pre-operative assessment

## 2012-02-01 NOTE — Transfer of Care (Signed)
Immediate Anesthesia Transfer of Care Note  Patient: Derek Calderon  Procedure(s) Performed: Procedure(s) (LRB): OPEN REDUCTION INTERNAL FIXATION (ORIF) TIBIA/FIBULA FRACTURE (Right)  Patient Location: PACU  Anesthesia Type: General and Regional  Level of Consciousness: awake  Airway & Oxygen Therapy: Patient Spontanous Breathing and Patient connected to face mask oxygen  Post-op Assessment: Report given to PACU RN and Post -op Vital signs reviewed and stable  Post vital signs: Reviewed and stable  Complications: No apparent anesthesia complications

## 2012-02-01 NOTE — Anesthesia Preprocedure Evaluation (Signed)
Anesthesia Evaluation  Patient identified by MRN, date of birth, ID band Patient awake    Reviewed: Allergy & Precautions, H&P , NPO status , Patient's Chart, lab work & pertinent test results, reviewed documented beta blocker date and time   History of Anesthesia Complications Negative for: history of anesthetic complications  Airway Mallampati: I TM Distance: >3 FB Neck ROM: Full    Dental  (+) Poor Dentition and Dental Advisory Given   Pulmonary asthma , COPD COPD inhaler, Current Smoker,  breath sounds clear to auscultation  Pulmonary exam normal       Cardiovascular hypertension, Pt. on medications and Pt. on home beta blockers Rhythm:Regular Rate:Normal     Neuro/Psych negative neurological ROS     GI/Hepatic negative GI ROS, Neg liver ROS,   Endo/Other  Well Controlled, Type 1, Insulin DependentMorbid obesity  Renal/GU negative Renal ROS     Musculoskeletal   Abdominal (+) + obese,   Peds  Hematology   Anesthesia Other Findings   Reproductive/Obstetrics                           Anesthesia Physical Anesthesia Plan  ASA: III  Anesthesia Plan: General   Post-op Pain Management:    Induction: Intravenous  Airway Management Planned: LMA  Additional Equipment:   Intra-op Plan:   Post-operative Plan:   Informed Consent: I have reviewed the patients History and Physical, chart, labs and discussed the procedure including the risks, benefits and alternatives for the proposed anesthesia with the patient or authorized representative who has indicated his/her understanding and acceptance.   Dental advisory given  Plan Discussed with: CRNA and Surgeon  Anesthesia Plan Comments: (Plan routine monitors, GA- LMA ok, popliteal block for post op analgesia)        Anesthesia Quick Evaluation

## 2012-02-01 NOTE — Anesthesia Postprocedure Evaluation (Signed)
  Anesthesia Post-op Note  Patient: [name]  Procedure(s) Performed: Procedure(s) (LRB): OPEN REDUCTION INTERNAL FIXATION (ORIF) TIBIA/FIBULA FRACTURE (Right)  Patient Location: PACU  Anesthesia Type: GA combined with regional for post-op pain  Level of Consciousness: awake, alert  and oriented  Airway and Oxygen Therapy: Patient Spontanous Breathing  Post-op Pain: none  Post-op Assessment: Post-op Vital signs reviewed, Patient's Cardiovascular Status Stable, Respiratory Function Stable, Patent Airway, No signs of Nausea or vomiting and Pain level controlled  Post-op Vital Signs: Reviewed and stable  Complications: No apparent anesthesia complications
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
## 2012-02-11 ENCOUNTER — Other Ambulatory Visit: Payer: Self-pay | Admitting: Physician Assistant

## 2012-02-11 ENCOUNTER — Telehealth: Payer: Self-pay

## 2012-02-11 MED ORDER — LORAZEPAM 1 MG PO TABS: 1.0000 mg | ORAL_TABLET | Freq: Three times a day (TID) | ORAL | Status: DC | PRN

## 2012-02-11 MED ORDER — PAROXETINE HCL 40 MG PO TABS: 40.0000 mg | ORAL_TABLET | ORAL | Status: DC

## 2012-02-11 NOTE — Telephone Encounter (Signed)
PT STATES HE IS IN NEED OF HIS LORAZEPAM PLEASE CALL [phone]

## 2012-02-11 NOTE — Telephone Encounter (Signed)
Please review for both Paxil and Lorazepam..Marland Kitchen

## 2012-02-11 NOTE — Telephone Encounter (Signed)
Spoke with patient, patient understands rx's ready

## 2012-02-11 NOTE — Telephone Encounter (Signed)
Looks like patient was tapered off of this in February and started Wellbutrin.  Please advise... ? RTC?

## 2012-02-11 NOTE — Telephone Encounter (Signed)
Done and sent in/printed.
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
## 2012-03-10 ENCOUNTER — Other Ambulatory Visit: Payer: Self-pay | Admitting: Physician Assistant

## 2012-03-10 NOTE — Telephone Encounter (Signed)
OK X 1, NEEDS OV FOR MORE? 

## 2012-03-11 ENCOUNTER — Other Ambulatory Visit: Payer: Self-pay

## 2012-03-11 ENCOUNTER — Telehealth: Payer: Self-pay

## 2012-03-11 MED ORDER — LORAZEPAM 1 MG PO TABS: 1.0000 mg | ORAL_TABLET | Freq: Three times a day (TID) | ORAL | Status: DC | PRN
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
## 2012-03-12 NOTE — Telephone Encounter (Signed)
Rx faxed in this am, patient notified.

## 2012-04-07 ENCOUNTER — Other Ambulatory Visit: Payer: Self-pay | Admitting: Physician Assistant

## 2012-04-07 NOTE — Telephone Encounter (Signed)
PT SAYS HE NEEDS A REFILL ON HIS PEROXATINE.  SEES [facility]. [phone]

## 2012-04-08 ENCOUNTER — Telehealth: Payer: Self-pay | Admitting: *Deleted

## 2012-04-08 NOTE — Telephone Encounter (Signed)
Derek Calderon 04/07/2012 12:50 PM Signed  PT SAYS HE NEEDS A REFILL ON HIS PEROXATINE. SEES [facility]. [phone]

## 2012-04-08 NOTE — Telephone Encounter (Signed)
Review of the patient's mediations indicates that this medication has already been refilled on 04/07/2012.

## 2012-04-09 NOTE — Telephone Encounter (Signed)
lmom that rx was sent to pharmacy 

## 2012-04-10 ENCOUNTER — Telehealth: Payer: Self-pay

## 2012-04-10 NOTE — Telephone Encounter (Signed)
Please advise on renewal of Lorazepam [facility]

## 2012-04-10 NOTE — Telephone Encounter (Signed)
Pt says that we got his medication confused and we called in the wrong thing please call patient at [phone]

## 2012-04-10 NOTE — Telephone Encounter (Signed)
PT IN NEED OF HIS LORAZEPAM AND WAS TOLD BY THE PHARMACY HE NEED TO CALL us PLEASE CALL [phone]     [facility] [address]

## 2012-04-11 ENCOUNTER — Telehealth: Payer: Self-pay | Admitting: Radiology

## 2012-04-11 MED ORDER — LORAZEPAM 1 MG PO TABS: 1.0000 mg | ORAL_TABLET | Freq: Three times a day (TID) | ORAL | Status: DC | PRN

## 2012-04-11 NOTE — Telephone Encounter (Signed)
Called in his lorazepam since it did not go through on fax. He is aware we are sending it.

## 2012-04-11 NOTE — Telephone Encounter (Signed)
Rx done and ready to be faxed 

## 2012-04-11 NOTE — Telephone Encounter (Signed)
Spoke with pt advised Rx faxed 

## 2012-05-01 ENCOUNTER — Other Ambulatory Visit: Payer: Self-pay | Admitting: Physician Assistant

## 2012-05-09 ENCOUNTER — Other Ambulatory Visit: Payer: Self-pay | Admitting: Physician Assistant

## 2012-05-11 ENCOUNTER — Encounter (HOSPITAL_COMMUNITY): Payer: Self-pay | Admitting: *Deleted

## 2012-05-11 ENCOUNTER — Emergency Department (HOSPITAL_COMMUNITY)
Admission: EM | Admit: 2012-05-11 | Discharge: 2012-05-11 | Disposition: A | Discharge status: Home / Self Care | Payer: Medicare Other | Attending: Emergency Medicine | Admitting: Emergency Medicine

## 2012-05-11 DIAGNOSIS — Z862 Personal history of diseases of the blood and blood-forming organs and certain disorders involving the immune mechanism: Secondary | ICD-10-CM | POA: Insufficient documentation

## 2012-05-11 DIAGNOSIS — Z79899 Other long term (current) drug therapy: Secondary | ICD-10-CM | POA: Insufficient documentation

## 2012-05-11 DIAGNOSIS — I10 Essential (primary) hypertension: Secondary | ICD-10-CM | POA: Insufficient documentation

## 2012-05-11 DIAGNOSIS — F329 Major depressive disorder, single episode, unspecified: Secondary | ICD-10-CM | POA: Insufficient documentation

## 2012-05-11 DIAGNOSIS — Y9289 Other specified places as the place of occurrence of the external cause: Secondary | ICD-10-CM | POA: Insufficient documentation

## 2012-05-11 DIAGNOSIS — T300 Burn of unspecified body region, unspecified degree: Secondary | ICD-10-CM

## 2012-05-11 DIAGNOSIS — T22019A Burn of unspecified degree of unspecified forearm, initial encounter: Secondary | ICD-10-CM | POA: Insufficient documentation

## 2012-05-11 DIAGNOSIS — F3289 Other specified depressive episodes: Secondary | ICD-10-CM | POA: Insufficient documentation

## 2012-05-11 DIAGNOSIS — Y9389 Activity, other specified: Secondary | ICD-10-CM | POA: Insufficient documentation

## 2012-05-11 DIAGNOSIS — Z87828 Personal history of other (healed) physical injury and trauma: Secondary | ICD-10-CM | POA: Insufficient documentation

## 2012-05-11 DIAGNOSIS — Z8639 Personal history of other endocrine, nutritional and metabolic disease: Secondary | ICD-10-CM | POA: Insufficient documentation

## 2012-05-11 DIAGNOSIS — E785 Hyperlipidemia, unspecified: Secondary | ICD-10-CM | POA: Insufficient documentation

## 2012-05-11 DIAGNOSIS — X19XXXA Contact with other heat and hot substances, initial encounter: Secondary | ICD-10-CM | POA: Insufficient documentation

## 2012-05-11 DIAGNOSIS — F17200 Nicotine dependence, unspecified, uncomplicated: Secondary | ICD-10-CM | POA: Insufficient documentation

## 2012-05-11 DIAGNOSIS — E119 Type 2 diabetes mellitus without complications: Secondary | ICD-10-CM | POA: Insufficient documentation

## 2012-05-11 DIAGNOSIS — J45909 Unspecified asthma, uncomplicated: Secondary | ICD-10-CM | POA: Insufficient documentation

## 2012-05-11 MED ORDER — SILVER SULFADIAZINE 1 % EX CREA: TOPICAL_CREAM | Freq: Once | CUTANEOUS | Status: DC

## 2012-05-11 MED ORDER — IBUPROFEN 200 MG PO TABS
400.0000 mg | ORAL_TABLET | Freq: Once | ORAL | Status: AC
  Administered 2012-05-11: 400 mg via ORAL
  Filled 2012-05-11: qty 2

## 2012-05-11 MED ORDER — OXYCODONE-ACETAMINOPHEN 5-325 MG PO TABS
1.0000 | ORAL_TABLET | Freq: Once | ORAL | Status: AC
  Administered 2012-05-11: 1 via ORAL
  Filled 2012-05-11: qty 1
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
## 2012-05-11 NOTE — ED Notes (Signed)
Called for patient in waiting room for triage again; no answer.  Patient LWBS before triage without notifying staff.
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
## 2012-05-11 NOTE — ED Notes (Signed)
Called x2 in waiting room for triage; no answer.  Will try again.
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
## 2012-05-14 ENCOUNTER — Telehealth: Payer: Self-pay

## 2012-05-14 MED ORDER — LORAZEPAM 1 MG PO TABS: 1.0000 mg | ORAL_TABLET | Freq: Three times a day (TID) | ORAL | Status: DC | PRN

## 2012-05-14 NOTE — Telephone Encounter (Signed)
Rx done. Will need office visit for any additional refills

## 2012-05-14 NOTE — Telephone Encounter (Signed)
Pt would like a refill on Lorazepam. [facility] [phone] [facility] [address]

## 2012-05-14 NOTE — Telephone Encounter (Signed)
Do you want to RF pt's Lorazepam? It looks like we put on his last RF that he needs OV, but no documentation that we told him this when we spoke w/him.

## 2012-05-14 NOTE — Telephone Encounter (Signed)
Called in Rx and notified pt 

## 2012-05-14 NOTE — Telephone Encounter (Signed)
This patient needs an OV.  What's the plan?  Per Dr. Frederik Pear note in April, the patient was to F/U this summer.
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
## 2012-05-19 ENCOUNTER — Telehealth: Payer: Self-pay

## 2012-05-19 NOTE — Telephone Encounter (Signed)
The patient called to request rx refill for Albuterol inhaler.  Please call the patient at [phone].

## 2012-05-20 MED ORDER — ALBUTEROL SULFATE HFA 108 (90 BASE) MCG/ACT IN AERS: 2.0000 | INHALATION_SPRAY | Freq: Four times a day (QID) | RESPIRATORY_TRACT | Status: DC | PRN

## 2012-05-20 NOTE — Telephone Encounter (Signed)
Called patient to advise  °

## 2012-05-20 NOTE — Telephone Encounter (Signed)
Patient over due for follow up, he is aware, I advised him with Lorazepam renewal, he wants Albuterol inhaler, please advise.

## 2012-05-20 NOTE — Telephone Encounter (Signed)
I have refilled albuterol, but pt needs follow up, was supposed to follow-up months ago

## 2012-06-11 ENCOUNTER — Ambulatory Visit: Payer: Medicare Other

## 2012-07-19 ENCOUNTER — Other Ambulatory Visit: Payer: Self-pay | Admitting: Physician Assistant

## 2012-08-15 ENCOUNTER — Emergency Department (HOSPITAL_COMMUNITY)
Admission: EM | Admit: 2012-08-15 | Discharge: 2012-08-16 | Disposition: A | Discharge status: Home / Self Care | Payer: Medicare Other | Attending: Emergency Medicine | Admitting: Emergency Medicine

## 2012-08-15 ENCOUNTER — Emergency Department (HOSPITAL_COMMUNITY): Payer: Medicare Other

## 2012-08-15 ENCOUNTER — Encounter (HOSPITAL_COMMUNITY): Payer: Self-pay | Admitting: Emergency Medicine

## 2012-08-15 DIAGNOSIS — J45909 Unspecified asthma, uncomplicated: Secondary | ICD-10-CM | POA: Insufficient documentation

## 2012-08-15 DIAGNOSIS — E119 Type 2 diabetes mellitus without complications: Secondary | ICD-10-CM | POA: Insufficient documentation

## 2012-08-15 DIAGNOSIS — Z862 Personal history of diseases of the blood and blood-forming organs and certain disorders involving the immune mechanism: Secondary | ICD-10-CM | POA: Insufficient documentation

## 2012-08-15 DIAGNOSIS — L97909 Non-pressure chronic ulcer of unspecified part of unspecified lower leg with unspecified severity: Secondary | ICD-10-CM | POA: Insufficient documentation

## 2012-08-15 DIAGNOSIS — F3289 Other specified depressive episodes: Secondary | ICD-10-CM | POA: Insufficient documentation

## 2012-08-15 DIAGNOSIS — E785 Hyperlipidemia, unspecified: Secondary | ICD-10-CM | POA: Insufficient documentation

## 2012-08-15 DIAGNOSIS — I10 Essential (primary) hypertension: Secondary | ICD-10-CM | POA: Insufficient documentation

## 2012-08-15 DIAGNOSIS — L97509 Non-pressure chronic ulcer of other part of unspecified foot with unspecified severity: Secondary | ICD-10-CM

## 2012-08-15 DIAGNOSIS — Z79899 Other long term (current) drug therapy: Secondary | ICD-10-CM | POA: Insufficient documentation

## 2012-08-15 DIAGNOSIS — Z8639 Personal history of other endocrine, nutritional and metabolic disease: Secondary | ICD-10-CM | POA: Insufficient documentation

## 2012-08-15 DIAGNOSIS — Z794 Long term (current) use of insulin: Secondary | ICD-10-CM | POA: Insufficient documentation

## 2012-08-15 DIAGNOSIS — F329 Major depressive disorder, single episode, unspecified: Secondary | ICD-10-CM | POA: Insufficient documentation

## 2012-08-15 DIAGNOSIS — F17200 Nicotine dependence, unspecified, uncomplicated: Secondary | ICD-10-CM | POA: Insufficient documentation

## 2012-08-15 DIAGNOSIS — Z8781 Personal history of (healed) traumatic fracture: Secondary | ICD-10-CM | POA: Insufficient documentation

## 2012-08-15 MED ORDER — OXYCODONE-ACETAMINOPHEN 5-325 MG PO TABS
1.0000 | ORAL_TABLET | Freq: Once | ORAL | Status: AC
  Administered 2012-08-15: 1 via ORAL
  Filled 2012-08-15: qty 1

## 2012-08-15 MED ORDER — SULFAMETHOXAZOLE-TMP DS 800-160 MG PO TABS
1.0000 | ORAL_TABLET | Freq: Once | ORAL | Status: AC
  Administered 2012-08-15: 1 via ORAL
  Filled 2012-08-15: qty 1

## 2012-08-15 MED ORDER — CEPHALEXIN 250 MG PO CAPS
500.0000 mg | ORAL_CAPSULE | Freq: Four times a day (QID) | ORAL | Status: DC
  Administered 2012-08-15: 500 mg via ORAL
  Filled 2012-08-15: qty 2
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
## 2012-08-15 NOTE — ED Notes (Signed)
C/o bottom of R great toe infected x 2 months (worse over the past 1 week).  R great toe redness, swelling, and pain.

## 2012-08-16 ENCOUNTER — Telehealth (HOSPITAL_COMMUNITY): Payer: Self-pay | Admitting: Emergency Medicine

## 2012-08-16 MED ORDER — SULFAMETHOXAZOLE-TRIMETHOPRIM 800-160 MG PO TABS: 1.0000 | ORAL_TABLET | Freq: Two times a day (BID) | ORAL | Status: DC

## 2012-08-16 MED ORDER — CEPHALEXIN 500 MG PO CAPS: 500.0000 mg | ORAL_CAPSULE | Freq: Four times a day (QID) | ORAL | Status: DC

## 2012-08-16 MED ORDER — OXYCODONE-ACETAMINOPHEN 5-325 MG PO TABS: 1.0000 | ORAL_TABLET | Freq: Four times a day (QID) | ORAL | Status: DC | PRN

## 2012-08-16 NOTE — ED Notes (Signed)
Pt friend driving pt home.  

## 2012-08-18 ENCOUNTER — Ambulatory Visit (HOSPITAL_COMMUNITY)
Admission: RE | Admit: 2012-08-18 | Discharge: 2012-08-18 | Disposition: A | Discharge status: Home / Self Care | Payer: Medicare Other | Source: Ambulatory Visit | Attending: Emergency Medicine | Admitting: Emergency Medicine

## 2012-08-18 DIAGNOSIS — R937 Abnormal findings on diagnostic imaging of other parts of musculoskeletal system: Secondary | ICD-10-CM | POA: Insufficient documentation

## 2012-08-18 DIAGNOSIS — L97509 Non-pressure chronic ulcer of other part of unspecified foot with unspecified severity: Secondary | ICD-10-CM | POA: Insufficient documentation

## 2012-08-18 NOTE — ED Provider Notes (Signed)
Medical screening examination/treatment/procedure(s) were conducted as a shared visit with non-physician practitioner(s) and myself.  I personally evaluated the patient during the encounter 62 yo diabetic man with ulcer on the right great toe, appx. 1 cm x 1/2 cm, located on the plantar surface of the right great toe beneath the interphalangeal joint.  X-rays suggest possible osteomyelitis.  Needs antibiotic treatment, MRI of the right great toe, and orthopedic followup with Dr. Lajoyce Corners.  Carleene Kellman III, MD 08/18/12 [phone]

## 2012-08-22 ENCOUNTER — Emergency Department (HOSPITAL_COMMUNITY): Payer: Medicare Other

## 2012-08-22 ENCOUNTER — Emergency Department (HOSPITAL_COMMUNITY)
Admission: EM | Admit: 2012-08-22 | Discharge: 2012-08-22 | Disposition: A | Discharge status: Home / Self Care | Payer: Medicare Other | Attending: Emergency Medicine | Admitting: Emergency Medicine

## 2012-08-22 ENCOUNTER — Encounter (HOSPITAL_COMMUNITY): Payer: Self-pay | Admitting: Cardiology

## 2012-08-22 DIAGNOSIS — E1169 Type 2 diabetes mellitus with other specified complication: Secondary | ICD-10-CM | POA: Insufficient documentation

## 2012-08-22 DIAGNOSIS — I10 Essential (primary) hypertension: Secondary | ICD-10-CM | POA: Insufficient documentation

## 2012-08-22 DIAGNOSIS — J3489 Other specified disorders of nose and nasal sinuses: Secondary | ICD-10-CM | POA: Insufficient documentation

## 2012-08-22 DIAGNOSIS — Z8781 Personal history of (healed) traumatic fracture: Secondary | ICD-10-CM | POA: Insufficient documentation

## 2012-08-22 DIAGNOSIS — J45909 Unspecified asthma, uncomplicated: Secondary | ICD-10-CM | POA: Insufficient documentation

## 2012-08-22 DIAGNOSIS — F17200 Nicotine dependence, unspecified, uncomplicated: Secondary | ICD-10-CM | POA: Insufficient documentation

## 2012-08-22 DIAGNOSIS — Z862 Personal history of diseases of the blood and blood-forming organs and certain disorders involving the immune mechanism: Secondary | ICD-10-CM | POA: Insufficient documentation

## 2012-08-22 DIAGNOSIS — E785 Hyperlipidemia, unspecified: Secondary | ICD-10-CM | POA: Insufficient documentation

## 2012-08-22 DIAGNOSIS — Z79899 Other long term (current) drug therapy: Secondary | ICD-10-CM | POA: Insufficient documentation

## 2012-08-22 DIAGNOSIS — Z794 Long term (current) use of insulin: Secondary | ICD-10-CM | POA: Insufficient documentation

## 2012-08-22 DIAGNOSIS — R059 Cough, unspecified: Secondary | ICD-10-CM | POA: Insufficient documentation

## 2012-08-22 DIAGNOSIS — L97809 Non-pressure chronic ulcer of other part of unspecified lower leg with unspecified severity: Secondary | ICD-10-CM | POA: Insufficient documentation

## 2012-08-22 DIAGNOSIS — R5381 Other malaise: Secondary | ICD-10-CM | POA: Insufficient documentation

## 2012-08-22 DIAGNOSIS — R509 Fever, unspecified: Secondary | ICD-10-CM | POA: Insufficient documentation

## 2012-08-22 MED ORDER — AZITHROMYCIN 250 MG PO TABS: 250.0000 mg | ORAL_TABLET | Freq: Every day | ORAL | Status: DC

## 2012-08-22 MED ORDER — HYDROCODONE-ACETAMINOPHEN 5-325 MG PO TABS: 2.0000 | ORAL_TABLET | ORAL | Status: DC | PRN

## 2012-08-22 MED ORDER — HYDROCODONE-ACETAMINOPHEN 5-325 MG PO TABS
2.0000 | ORAL_TABLET | Freq: Once | ORAL | Status: AC
  Administered 2012-08-22: 2 via ORAL
  Filled 2012-08-22: qty 2

## 2012-08-22 NOTE — ED Notes (Signed)
Pt reports he was seen last week and had an MRI done on his right foot and given antibiotics. States the area looks better and pain is better. No redness noted but slight drainage.Denies any fever.
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
## 2012-08-23 NOTE — ED Provider Notes (Signed)
Medical screening examination/treatment/procedure(s) were performed by non-physician practitioner and as supervising physician I was immediately available for consultation/collaboration.   Carleene Riedl III, MD 08/23/12 [phone]

## 2012-09-16 ENCOUNTER — Encounter (HOSPITAL_BASED_OUTPATIENT_CLINIC_OR_DEPARTMENT_OTHER): Payer: Medicare Other

## 2012-10-05 ENCOUNTER — Emergency Department (HOSPITAL_COMMUNITY)
Admission: EM | Admit: 2012-10-05 | Discharge: 2012-10-05 | Disposition: A | Discharge status: Home / Self Care | Payer: Medicare Other | Attending: Emergency Medicine | Admitting: Emergency Medicine

## 2012-10-05 ENCOUNTER — Encounter (HOSPITAL_COMMUNITY): Payer: Self-pay | Admitting: Emergency Medicine

## 2012-10-05 ENCOUNTER — Emergency Department (HOSPITAL_COMMUNITY): Payer: Medicare Other

## 2012-10-05 DIAGNOSIS — J45909 Unspecified asthma, uncomplicated: Secondary | ICD-10-CM | POA: Insufficient documentation

## 2012-10-05 DIAGNOSIS — S99919A Unspecified injury of unspecified ankle, initial encounter: Secondary | ICD-10-CM | POA: Insufficient documentation

## 2012-10-05 DIAGNOSIS — Z9889 Other specified postprocedural states: Secondary | ICD-10-CM | POA: Insufficient documentation

## 2012-10-05 DIAGNOSIS — F3289 Other specified depressive episodes: Secondary | ICD-10-CM | POA: Insufficient documentation

## 2012-10-05 DIAGNOSIS — S8990XA Unspecified injury of unspecified lower leg, initial encounter: Secondary | ICD-10-CM | POA: Insufficient documentation

## 2012-10-05 DIAGNOSIS — M25561 Pain in right knee: Secondary | ICD-10-CM

## 2012-10-05 DIAGNOSIS — Y9301 Activity, walking, marching and hiking: Secondary | ICD-10-CM | POA: Insufficient documentation

## 2012-10-05 DIAGNOSIS — Z862 Personal history of diseases of the blood and blood-forming organs and certain disorders involving the immune mechanism: Secondary | ICD-10-CM | POA: Insufficient documentation

## 2012-10-05 DIAGNOSIS — W010XXA Fall on same level from slipping, tripping and stumbling without subsequent striking against object, initial encounter: Secondary | ICD-10-CM | POA: Insufficient documentation

## 2012-10-05 DIAGNOSIS — I10 Essential (primary) hypertension: Secondary | ICD-10-CM | POA: Insufficient documentation

## 2012-10-05 DIAGNOSIS — E785 Hyperlipidemia, unspecified: Secondary | ICD-10-CM | POA: Insufficient documentation

## 2012-10-05 DIAGNOSIS — W1809XA Striking against other object with subsequent fall, initial encounter: Secondary | ICD-10-CM | POA: Insufficient documentation

## 2012-10-05 DIAGNOSIS — Z794 Long term (current) use of insulin: Secondary | ICD-10-CM | POA: Insufficient documentation

## 2012-10-05 DIAGNOSIS — F17200 Nicotine dependence, unspecified, uncomplicated: Secondary | ICD-10-CM | POA: Insufficient documentation

## 2012-10-05 DIAGNOSIS — Z8781 Personal history of (healed) traumatic fracture: Secondary | ICD-10-CM | POA: Insufficient documentation

## 2012-10-05 DIAGNOSIS — F329 Major depressive disorder, single episode, unspecified: Secondary | ICD-10-CM | POA: Insufficient documentation

## 2012-10-05 DIAGNOSIS — R52 Pain, unspecified: Secondary | ICD-10-CM | POA: Insufficient documentation

## 2012-10-05 DIAGNOSIS — Y9289 Other specified places as the place of occurrence of the external cause: Secondary | ICD-10-CM | POA: Insufficient documentation

## 2012-10-05 DIAGNOSIS — Z79899 Other long term (current) drug therapy: Secondary | ICD-10-CM | POA: Insufficient documentation

## 2012-10-05 DIAGNOSIS — Z8679 Personal history of other diseases of the circulatory system: Secondary | ICD-10-CM | POA: Insufficient documentation

## 2012-10-05 DIAGNOSIS — E119 Type 2 diabetes mellitus without complications: Secondary | ICD-10-CM | POA: Insufficient documentation

## 2012-10-05 MED ORDER — HYDROCODONE-ACETAMINOPHEN 5-325 MG PO TABS: 1.0000 | ORAL_TABLET | ORAL | Status: DC | PRN

## 2012-10-05 MED ORDER — OXYCODONE-ACETAMINOPHEN 5-325 MG PO TABS
2.0000 | ORAL_TABLET | Freq: Once | ORAL | Status: AC
  Administered 2012-10-05: 2 via ORAL
  Filled 2012-10-05: qty 2

## 2012-10-05 NOTE — Progress Notes (Signed)
Orthopedic Tech Progress Note Patient Details:  Derek Calderon 10-29-1950 [id number]  Ortho Devices Type of Ortho Device: Crutches Ortho Device/Splint Interventions: Application;Adjustment   Jennye Moccasin 10/05/2012, 10:18 PM

## 2012-10-05 NOTE — ED Provider Notes (Signed)
History    This chart was scribed for non-physician practitioner working with Vida Roller, MD by Sofie Rower, ED Scribe. This patient was seen in room TR04C/TR04C and the patient's care was started at 7:58PM.   CSN: [id number]  Arrival date & time 10/05/12  4098   First MD Initiated Contact with Patient 10/05/12 1958      Chief Complaint  Patient presents with  . Knee Pain    (Consider location/radiation/quality/duration/timing/severity/associated sxs/prior treatment) Patient is a 62 y.o. male presenting with knee pain. The history is provided by the patient. No language interpreter was used.  Knee Pain Location:  Knee Time since incident:  1 day Injury: no   Knee location:  R knee Pain details:    Quality:  Unable to specify   Radiates to:  Does not radiate   Severity:  Moderate   Onset quality:  Sudden   Duration:  1 day   Timing:  Constant   Progression:  Unchanged Chronicity:  New Dislocation: no   Foreign body present:  No foreign bodies Tetanus status:  Unknown Prior injury to area:  No Relieved by:  Nothing Worsened by:  Nothing tried Ineffective treatments:  None tried Associated symptoms: no back pain, no decreased ROM, no fatigue, no fever, no itching, no muscle weakness, no neck pain, no numbness, no stiffness, no swelling and no tingling     Derek Calderon is a 62 y.o. male , with a hx of hypertension, diabetes, hyperlipidemia, asthma, depression, fracture of fibula, carpal tunnel release and eye surgery, who presents to the Emergency Department complaining of sudden, progressively worsening, knee pain located at the right knee, onset yesterday (10/04/12). The pt reports he woke up yesterday evening, proceeded to walk to the bathroom, where he suddenly tripped over a pair of his shoes lying in the floor, fell and impacted directly upon his right knee.  Denies hitting head. Denies numbness or tingling in his feet.  The pt denies experiencing any other injuries or  pain during the fall.  The pt is a current everyday smoker, however, he does not drink alcohol.   PCP is Dr. Lucianne Muss.    Past Medical History  Diagnosis Date  . Lymphocytosis   . Mediastinal lymphadenopathy   . Diabetes mellitus   . Hyperlipidemia   . HTN (hypertension)   . Asthma   . Depression   . Fracture of fibula, distal, right, closed 02/01/2012    Past Surgical History  Procedure Laterality Date  . Carpal tunnel release  08-2009    right  . Tonsillectomy    . Eye surgery  2005    both cataracts    Family History  Problem Relation Age of Onset  . Allergies Sister   . Cancer Mother     stomach  . Pancreatic cancer Father     History  Substance Use Topics  . Smoking status: Current Every Day Smoker -- 0.50 packs/day for 15 years    Types: Cigarettes  . Smokeless tobacco: Not on file  . Alcohol Use: No     Comment: quit in 2005      Review of Systems  Constitutional: Negative for fever and fatigue.  HENT: Negative for neck pain.   Musculoskeletal: Positive for myalgias and gait problem. Negative for back pain and stiffness.  Skin: Negative for itching.    Allergies  Eszopiclone and Neosporin  Home Medications   Current Outpatient Rx  Name  Route  Sig  Dispense  Refill  .  albuterol (PROVENTIL HFA;VENTOLIN HFA) 108 (90 BASE) MCG/ACT inhaler   Inhalation   Inhale 2 puffs into the lungs every 6 (six) hours as needed for wheezing.         Marland Kitchen atorvastatin (LIPITOR) 10 MG tablet   Oral   Take 10 mg by mouth daily.           Marland Kitchen azithromycin (ZITHROMAX) 250 MG tablet   Oral   Take 1 tablet (250 mg total) by mouth daily. Take first 2 tablets together, then 1 every day until finished.   6 tablet   0   . cephALEXin (KEFLEX) 500 MG capsule   Oral   Take 500 mg by mouth 4 (four) times daily. Patient states he has one more day left until all finished         . HYDROcodone-acetaminophen (NORCO/VICODIN) 5-325 MG per tablet   Oral   Take 2 tablets by  mouth every 4 (four) hours as needed for pain.   10 tablet   0   . ibuprofen (ADVIL,MOTRIN) 200 MG tablet   Oral   Take 600 mg by mouth every 6 (six) hours as needed for pain.         Marland Kitchen insulin aspart (NOVOLOG) 100 UNIT/ML injection   Subcutaneous   Inject 20-25 Units into the skin 3 (three) times daily before meals. Per sliding scale         . insulin glargine (LANTUS) 100 UNIT/ML injection   Subcutaneous   Inject 40 Units into the skin daily. Per sliding scale         . levothyroxine (SYNTHROID, LEVOTHROID) 50 MCG tablet   Oral   Take 50 mcg by mouth daily.           . metFORMIN (GLUCOPHAGE) 1000 MG tablet   Oral   Take 1,000 mg by mouth 2 (two) times daily with a meal.          . metoprolol succinate (TOPROL-XL) 50 MG 24 hr tablet   Oral   Take 50 mg by mouth 2 (two) times daily. Take with or immediately following a meal.         . oxyCODONE-acetaminophen (PERCOCET/ROXICET) 5-325 MG per tablet   Oral   Take 1-2 tablets by mouth every 6 (six) hours as needed for pain.   15 tablet   0   . PARoxetine (PAXIL) 40 MG tablet   Oral   Take 40 mg by mouth every morning.         . sulfamethoxazole-trimethoprim (BACTRIM DS,SEPTRA DS) 800-160 MG per tablet   Oral   Take 1 tablet by mouth every 12 (twelve) hours.           BP 153/105  Pulse 90  Temp(Src) 97.6 F (36.4 C) (Oral)  Resp 16  SpO2 98%  Physical Exam  Nursing note and vitals reviewed. Constitutional: He is oriented to person, place, and time. He appears well-developed and well-nourished. No distress.  HENT:  Head: Normocephalic and atraumatic.  Eyes: EOM are normal.  Neck: Neck supple. No tracheal deviation present.  Cardiovascular: Normal rate.   Pulmonary/Chest: Effort normal. No respiratory distress.  Musculoskeletal: Normal range of motion.       Legs: Right knee: Abrasion to the right patellar tendon. Mild effusion to the infrapatellar area. Good extension and felexion, intact distal  pulses. Diabetic foot ulcer located at the bottom of the right big toe.   Neurological: He is alert and oriented to person, place, and time.  Skin: Skin is warm and dry.  Psychiatric: He has a normal mood and affect. His behavior is normal.    ED Course  Procedures (including critical care time)  DIAGNOSTIC STUDIES: Oxygen Saturation is 98% on room air, normal by my interpretation.    COORDINATION OF CARE:   9:36 PM- Treatment plan discussed with patient. Pt agrees with treatment.     Labs Reviewed - No data to display  Dg Knee Complete 4 Views Right  10/05/2012  *RADIOLOGY REPORT*  Clinical Data: Fall.  Anterior knee pain.  RIGHT KNEE - COMPLETE 4+ VIEW  Comparison: 08/05/2011  Findings: Meniscal chondrocalcinosis noted with tri-compartmental spurring.  Vascular calcifications noted.  No definite knee effusion.  No acute bony findings.  IMPRESSION:  1.  Spurring and chondrocalcinosis, favoring CPPD arthropathy.   Original Report Authenticated By: Gaylyn Rong, M.D.       1. Knee pain, acute, right       MDM  BP 153/105  Pulse 90  Temp(Src) 97.6 F (36.4 C) (Oral)  Resp 16  SpO2 98% paitient with diabetes and signs of pseudogout. Appears to be some sign of effusion. No heat, redness or signs of infection.  Patient will be discharged with pain meds and knee sleeve. Needs follow up with ortho.The patient appears reasonably screened and/or stabilized for discharge and I doubt any other medical condition or other [facility] requiring further screening, evaluation, or treatment in the ED at this time prior to discharge.       I personally performed the services described in this documentation, which was scribed in my presence. The recorded information has been reviewed and is accurate.     Arthor Captain, PA-C 10/06/12 1810

## 2012-10-05 NOTE — ED Notes (Signed)
C/o R knee pain.  Pt states he got up to go to bathroom during the night and fell after tripping over shoes.

## 2012-10-05 NOTE — ED Notes (Signed)
Patient says he got up last night and did not see some shoes on the floor so he tripped and landed on his right knee.  He has some scrapes and swelling.  He is a diabetic so he wanted to get his knee checked.

## 2012-10-05 NOTE — ED Notes (Signed)
Patient is alert and orientedx4.  Patient was explained discharge instructions and they understood them with no questions.  The patient's sister, Babs Bertin, is transporting the patient home.

## 2012-10-07 ENCOUNTER — Other Ambulatory Visit: Payer: Self-pay | Admitting: Physician Assistant

## 2012-10-07 NOTE — ED Provider Notes (Signed)
Medical screening examination/treatment/procedure(s) were performed by non-physician practitioner and as supervising physician I was immediately available for consultation/collaboration.    Vida Roller, MD 10/07/12 [phone]

## 2012-11-03 ENCOUNTER — Other Ambulatory Visit: Payer: Self-pay | Admitting: Family Medicine

## 2012-12-10 ENCOUNTER — Emergency Department (HOSPITAL_COMMUNITY): Payer: Medicare Other

## 2012-12-10 ENCOUNTER — Emergency Department (HOSPITAL_COMMUNITY)
Admission: EM | Admit: 2012-12-10 | Discharge: 2012-12-10 | Disposition: A | Discharge status: Home / Self Care | Payer: Medicare Other | Attending: Emergency Medicine | Admitting: Emergency Medicine

## 2012-12-10 ENCOUNTER — Encounter (HOSPITAL_COMMUNITY): Payer: Self-pay | Admitting: Physical Medicine and Rehabilitation

## 2012-12-10 DIAGNOSIS — W19XXXA Unspecified fall, initial encounter: Secondary | ICD-10-CM

## 2012-12-10 DIAGNOSIS — Z8781 Personal history of (healed) traumatic fracture: Secondary | ICD-10-CM | POA: Insufficient documentation

## 2012-12-10 DIAGNOSIS — J45909 Unspecified asthma, uncomplicated: Secondary | ICD-10-CM | POA: Insufficient documentation

## 2012-12-10 DIAGNOSIS — Y92009 Unspecified place in unspecified non-institutional (private) residence as the place of occurrence of the external cause: Secondary | ICD-10-CM | POA: Insufficient documentation

## 2012-12-10 DIAGNOSIS — S4991XA Unspecified injury of right shoulder and upper arm, initial encounter: Secondary | ICD-10-CM

## 2012-12-10 DIAGNOSIS — S4980XA Other specified injuries of shoulder and upper arm, unspecified arm, initial encounter: Secondary | ICD-10-CM | POA: Insufficient documentation

## 2012-12-10 DIAGNOSIS — F3289 Other specified depressive episodes: Secondary | ICD-10-CM | POA: Insufficient documentation

## 2012-12-10 DIAGNOSIS — S46909A Unspecified injury of unspecified muscle, fascia and tendon at shoulder and upper arm level, unspecified arm, initial encounter: Secondary | ICD-10-CM | POA: Insufficient documentation

## 2012-12-10 DIAGNOSIS — F17200 Nicotine dependence, unspecified, uncomplicated: Secondary | ICD-10-CM | POA: Insufficient documentation

## 2012-12-10 DIAGNOSIS — Z79899 Other long term (current) drug therapy: Secondary | ICD-10-CM | POA: Insufficient documentation

## 2012-12-10 DIAGNOSIS — I10 Essential (primary) hypertension: Secondary | ICD-10-CM | POA: Insufficient documentation

## 2012-12-10 DIAGNOSIS — Z862 Personal history of diseases of the blood and blood-forming organs and certain disorders involving the immune mechanism: Secondary | ICD-10-CM | POA: Insufficient documentation

## 2012-12-10 DIAGNOSIS — E119 Type 2 diabetes mellitus without complications: Secondary | ICD-10-CM | POA: Insufficient documentation

## 2012-12-10 DIAGNOSIS — F329 Major depressive disorder, single episode, unspecified: Secondary | ICD-10-CM | POA: Insufficient documentation

## 2012-12-10 DIAGNOSIS — Y939 Activity, unspecified: Secondary | ICD-10-CM | POA: Insufficient documentation

## 2012-12-10 DIAGNOSIS — R296 Repeated falls: Secondary | ICD-10-CM | POA: Insufficient documentation

## 2012-12-10 DIAGNOSIS — E785 Hyperlipidemia, unspecified: Secondary | ICD-10-CM | POA: Insufficient documentation

## 2012-12-10 DIAGNOSIS — Z794 Long term (current) use of insulin: Secondary | ICD-10-CM | POA: Insufficient documentation

## 2012-12-10 MED ORDER — OXYCODONE-ACETAMINOPHEN 5-325 MG PO TABS: 1.0000 | ORAL_TABLET | ORAL | Status: DC | PRN

## 2012-12-10 MED ORDER — HYDROCODONE-ACETAMINOPHEN 5-325 MG PO TABS
2.0000 | ORAL_TABLET | Freq: Once | ORAL | Status: AC
  Administered 2012-12-10: 2 via ORAL
  Filled 2012-12-10: qty 2

## 2012-12-10 NOTE — ED Notes (Signed)
Pt presents to department for evaluation of R shoulder pain. States he fell x1 week ago on R shoulder. No relief with OTC medications. 5/10 pain at the time. No obvious deformity noted. Full ROM noted. Pt is alert and oriented x4. Ambulatory to exam room.

## 2012-12-10 NOTE — ED Provider Notes (Signed)
History    This chart was scribed for non-physician practitioner, Dierdre Forth, PA-C, working with Gerhard Munch, MD by Donne Anon, ED Scribe. This patient was seen in room TR09C/TR09C and the patient's care was started at 1937.   CSN: [id number]  Arrival date & time 12/10/12  1649   First MD Initiated Contact with Patient 12/10/12 1937      Chief Complaint  Patient presents with  . Shoulder Pain     The history is provided by the patient and medical records. No language interpreter was used.   HPI Comments: Derek Calderon is a 62 y.o. male who presents to the Emergency Department complaining of sudden onset, gradually worsening, constant, moderate right shoulder pain (rated 5/10) that began 1 week ago when he fell on his shoulder. He denies LOC and remembers the fall. The pain radiates down his arm and up into his neck and is worse with movement. He has tried ibuprofen and ice with mild relief. He denies neck pain, back pain, LOC any other pain.  Pt is a current everyday smoker.   Past Medical History  Diagnosis Date  . Lymphocytosis   . Mediastinal lymphadenopathy   . Diabetes mellitus   . Hyperlipidemia   . HTN (hypertension)   . Asthma   . Depression   . Fracture of fibula, distal, right, closed 02/01/2012    Past Surgical History  Procedure Laterality Date  . Carpal tunnel release  08-2009    right  . Tonsillectomy    . Eye surgery  2005    both cataracts    Family History  Problem Relation Age of Onset  . Allergies Sister   . Cancer Mother     stomach  . Pancreatic cancer Father     History  Substance Use Topics  . Smoking status: Current Every Day Smoker -- 0.50 packs/day for 15 years    Types: Cigarettes  . Smokeless tobacco: Not on file  . Alcohol Use: No     Comment: quit in 2005      Review of Systems  Constitutional: Negative for fever, diaphoresis, appetite change, fatigue and unexpected weight change.  HENT: Negative for mouth  sores and neck stiffness.   Eyes: Negative for visual disturbance.  Respiratory: Negative for cough, chest tightness, shortness of breath and wheezing.   Cardiovascular: Negative for chest pain.  Gastrointestinal: Negative for nausea, vomiting, abdominal pain, diarrhea and constipation.  Endocrine: Negative for polydipsia, polyphagia and polyuria.  Genitourinary: Negative for dysuria, urgency, frequency and hematuria.  Musculoskeletal: Positive for arthralgias. Negative for back pain.  Skin: Negative for rash.  Allergic/Immunologic: Negative for immunocompromised state.  Neurological: Negative for syncope, light-headedness and headaches.  Hematological: Does not bruise/bleed easily.  Psychiatric/Behavioral: Negative for sleep disturbance. The patient is not nervous/anxious.     Allergies  Eszopiclone and Neosporin  Home Medications   Current Outpatient Rx  Name  Route  Sig  Dispense  Refill  . albuterol (PROVENTIL HFA;VENTOLIN HFA) 108 (90 BASE) MCG/ACT inhaler   Inhalation   Inhale 2 puffs into the lungs every 6 (six) hours as needed for wheezing or shortness of breath.          Marland Kitchen atorvastatin (LIPITOR) 10 MG tablet   Oral   Take 10 mg by mouth daily.           . insulin aspart (NOVOLOG) 100 UNIT/ML injection   Subcutaneous   Inject 20-25 Units into the skin 3 (three) times daily before meals.  Per sliding scale         . insulin glargine (LANTUS) 100 UNIT/ML injection   Subcutaneous   Inject 10-40 Units into the skin daily. Per sliding scale         . levothyroxine (SYNTHROID, LEVOTHROID) 50 MCG tablet   Oral   Take 50 mcg by mouth daily.           . metFORMIN (GLUCOPHAGE) 1000 MG tablet   Oral   Take 1,000 mg by mouth 2 (two) times daily with a meal.          . metoprolol (LOPRESSOR) 50 MG tablet   Oral   Take 50 mg by mouth 2 (two) times daily.         Marland Kitchen PARoxetine (PAXIL) 40 MG tablet   Oral   Take 40 mg by mouth every morning.         Marland Kitchen  oxyCODONE-acetaminophen (PERCOCET/ROXICET) 5-325 MG per tablet   Oral   Take 1 tablet by mouth every 4 (four) hours as needed for pain.   9 tablet   0     BP 154/104  Pulse 107  Temp(Src) 98.7 F (37.1 C) (Oral)  Resp 18  SpO2 96%  Physical Exam  Nursing note and vitals reviewed. Constitutional: He appears well-developed and well-nourished. No distress.  HENT:  Head: Normocephalic and atraumatic.  Mouth/Throat: Oropharynx is clear and moist. No oropharyngeal exudate.  Eyes: Conjunctivae are normal. No scleral icterus.  Neck: Normal range of motion. Neck supple.  Cardiovascular: Normal rate, regular rhythm, normal heart sounds and intact distal pulses.   No murmur heard. Capillary refill < 3 sec  Pulmonary/Chest: Effort normal and breath sounds normal. No respiratory distress. He has no wheezes.  Abdominal: Soft. Bowel sounds are normal. He exhibits no mass. There is no tenderness. There is no rebound and no guarding.  Musculoskeletal: He exhibits tenderness. He exhibits no edema.       Right shoulder: He exhibits decreased range of motion, tenderness and pain. He exhibits no bony tenderness, no swelling, no effusion, no crepitus, no deformity, no laceration, no spasm and normal pulse.       Right upper arm: He exhibits tenderness. He exhibits no bony tenderness, no swelling, no edema, no deformity and no laceration.  Good symmetry of the shoulders. Positive Apley scratch test. Decreased ROM of shoulder secondary to pain. Sensation intact.  Neurological: He is alert. Coordination normal.  Speech is clear and goal oriented Moves extremities without ataxia Strength 4/5 secondary to pain Sensation intact in the bilateral upper extremities  Skin: Skin is warm and dry. No rash noted. He is not diaphoretic. No erythema.  No tenting of the skin  Psychiatric: He has a normal mood and affect.    ED Course  Procedures (including critical care time) DIAGNOSTIC STUDIES: Oxygen  Saturation is 96% on RA, adequate by my interpretation.    COORDINATION OF CARE: 8:10 PM Discussed treatment plan which includes humerus xray, arm sling and medication with pt at bedside and pt agreed to plan. Informed of negative xray. Will give referral to orthopedist.   Labs Reviewed - No data to display Dg Shoulder Right  12/10/2012   *RADIOLOGY REPORT*  Clinical Data: Fall.  Shoulder pain.  RIGHT SHOULDER - 2+ VIEW  Comparison: None.  Findings: Degenerative changes are noted at the glenohumeral joint. Prominent acromial spur is noted.  No acute bone or soft tissue abnormality is present.  IMPRESSION:  1.  Degenerative changes of  the glenohumeral joint. 2.  Prominent acromial spur. 3.  No acute abnormality.   Original Report Authenticated By: Marin Roberts, M.D.   Dg Humerus Right  12/10/2012   *RADIOLOGY REPORT*  Clinical Data: Right humerus pain.  Fall.  RIGHT HUMERUS - 2+ VIEW  Comparison: Right shoulder films of the same day.  Findings: Degenerative changes are noted in the right shoulder.  No acute bone or soft tissue abnormalities are evident.  IMPRESSION:  1.  Degenerative changes of the right shoulder. 2.  No acute abnormality.   Original Report Authenticated By: Marin Roberts, M.D.     1. Shoulder injury, right, initial encounter   2. Fall at home, initial encounter       MDM  Derek Calderon presents after fall with shoulder and humerus pain.  Patient X-Ray negative for obvious fracture or dislocation. I personally reviewed the imaging tests through PACS system.  I reviewed available ER/hospitalization records through the EMR.  Pain managed in ED. Pt advised to follow up with orthopedics if symptoms persist for possibility of missed fracture diagnosis. Patient given sling while in ED, conservative therapy recommended and discussed. Patient will be dc home & is agreeable with above plan.  I have also discussed reasons to return immediately to the ER.  Patient expresses  understanding and agrees with plan.  I personally performed the services described in this documentation, which was scribed in my presence. The recorded information has been reviewed and is accurate.     Dahlia Client Ruble Pumphrey, PA-C 12/10/12 2136

## 2012-12-10 NOTE — ED Provider Notes (Signed)
  Medical screening examination/treatment/procedure(s) were performed by non-physician practitioner and as supervising physician I was immediately available for consultation/collaboration.    Gerhard Munch, MD 12/10/12 2348

## 2012-12-25 NOTE — Progress Notes (Signed)
Need orders in EPIC.  Surgery scheduled for 01/08/13.  Thank You.

## 2012-12-29 ENCOUNTER — Encounter (HOSPITAL_COMMUNITY): Payer: Self-pay | Admitting: Pharmacy Technician

## 2012-12-30 NOTE — Progress Notes (Signed)
EKG 02/06/12 on EPIC, Chest x-ray 08/22/12 on EPIC- abnormal, will repeat pre-op

## 2012-12-30 NOTE — Patient Instructions (Addendum)
22 Derek Calderon  12/30/2012   Your procedure is scheduled on: 01/08/13  Report to [facility] at 10:15 AM.  Call this number if you have problems the morning of surgery [phone]   Remember: please bring inhaler on day of surgery    Do not eat food or drink liquids After Midnight.     Take these medicines the morning of surgery with A SIP OF WATER: lipitor, synthroid, metoprolol   Do not wear jewelry, make-up or nail polish.  Do not wear lotions, powders, or perfumes. You may wear deodorant.  Do not shave 48 hours prior to surgery. Men may shave face and neck.  Do not bring valuables to the hospital.  Contacts, dentures or bridgework may not be worn into surgery.  Leave suitcase in the car. After surgery it may be brought to your room.  For patients admitted to the hospital, checkout time is 11:00 AM the day of discharge.    Please read over the following fact sheets that you were given: incentive spirometry fact sheet Birdie Sons, RN  pre op nurse call if needed [phone]    FAILURE TO FOLLOW THESE INSTRUCTIONS MAY RESULT IN CANCELLATION OF YOUR SURGERY   Patient Signature: ___________________________________________

## 2012-12-31 ENCOUNTER — Inpatient Hospital Stay (HOSPITAL_COMMUNITY)
Admission: RE | Admit: 2012-12-31 | Discharge: 2012-12-31 | Disposition: A | Discharge status: Home / Self Care | Payer: Medicare Other | Source: Ambulatory Visit

## 2013-01-04 NOTE — H&P (Signed)
Derek Calderon DOB: 1950-11-16  Chief Complaint: right shoulder pain  History of Present Illness The patient is a 62 year old male who presents with shoulder complaints. They are right handed and present today reporting pain, pain with overhead motions, pain with reaching and pain with lifting at the right shoulder that began 1 month ago. The patient reports that the shoulder symptoms began following a specific injury in which he fell onto his arm. The patient was previously evaluated in the emergency room. This problem has not been previously evaluated. Past treatment has included use of a sling. He has weakness in regards to trying to bring his shoulder up from the side. MRI revealed full thickness rotator cuff tear in the right shoulder.   Allergies No Known Drug Allergies.    Family History Cancer. mother, father and grandfather fathers side Diabetes Mellitus. mother Heart Disease. grandmother mothers side   Social History Alcohol use. former drinker Children. 1 Exercise. Exercises daily; does running / walking Living situation. live alone Marital status. divorced Tobacco use. current every day smoker; smoke(d) 1 pack(s) per day   Medication History MetFORMIN HCl (1000MG  Tablet, Oral) Active. NovoLOG FlexPen (100UNIT/ML Solution, Subcutaneous) Active. Lantus ( Subcutaneous) Specific dose unknown - Active. Levothyroxine Sodium ( Tablet, Oral) Active. Testosterone ( Intramuscular) Specific dose unknown - Active. Metoprolol Succinate ER (50MG  Tablet ER 24HR, Oral) Active. Atorvastatin Calcium (10MG  Tablet, Oral) Active. Advil (200MG  Tablet, Oral) Active.   Past Surgical History Arthroscopy of Knee. right Carpal Tunnel Repair. right Cataract Surgery. bilateral Foot Surgery. right Tonsillectomy   Past Medical History Asthma Chronic Pain Diabetes Mellitus, Type II Hypercholesterolemia Hyperthyroidism Peripheral  Neuropathy   Review of Systems General:Present- Night Sweats. Not Present- Chills, Fever, Appetite Loss, Fatigue, Feeling sick, Weight Gain and Weight Loss. Skin:Not Present- Itching, Rash, Skin Color Changes, Ulcer, Psoriasis and Change in Hair or Nails. HEENT:Not Present- Sensitivity to light, Hearing problems, Nose Bleed and Ringing in the Ears. Neck:Not Present- Swollen Glands and Neck Mass. Respiratory:Not Present- Snoring, Chronic Cough, Bloody sputum and Dyspnea. Cardiovascular:Not Present- Shortness of Breath, Chest Pain, Swelling of Extremities, Leg Cramps and Palpitations. Gastrointestinal:Not Present- Bloody Stool, Heartburn, Abdominal Pain, Vomiting, Nausea and Incontinence of Stool. Male Genitourinary:Not Present- Blood in Urine, Frequency, Incontinence and Nocturia. Musculoskeletal:Present- Joint Pain and Back Pain. Not Present- Muscle Weakness, Muscle Pain, Joint Stiffness and Joint Swelling. Neurological:Present- Tingling, Numbness and Burning. Not Present- Tremor, Headaches and Dizziness. Psychiatric:Present- Anxiety. Not Present- Depression and Memory Loss. Endocrine:Not Present- Cold Intolerance, Heat Intolerance, Excessive hunger and Excessive Thirst. Hematology:Not Present- Abnormal Bleeding, Anemia, Blood Clots and Easy Bruising.   Vitals Weight: 265 lb Height: 74 in Body Surface Area: 2.51 m Body Mass Index: 34.02 kg/m BP: 157/107 (Sitting, Left Arm, Standard)  HR: 72 bpm    Objective  Examination shows generalized pain with motion to the right shoulder. He has limited flexion of his right shoulder. He has pain with abduction of his right shoulder. His biceps and triceps are intact. He has a good radial pulse. Good function in his hand. He has no other major abnormalities. Lungs are clear to auscultation.Heart, normal sinus rhythm, no murmur. HEENT, oral cavity, he has some poor dentition. Neck supple, no bruits. Abdomen soft and  nontender. Bowel sounds active.    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 He  is going to be kept overnight. He cannot eat or drink after midnight the night before. He will go home the next day with a sling. He will be on antibiotics right when we take him back to surgery. Possible complications are rare such as infection. We are probably going to use a TissueMend graft and a couple of anchors.   Dimitri Ped, PA-C

## 2013-01-05 ENCOUNTER — Encounter (HOSPITAL_COMMUNITY): Payer: Self-pay

## 2013-01-05 ENCOUNTER — Ambulatory Visit (HOSPITAL_COMMUNITY)
Admission: RE | Admit: 2013-01-05 | Discharge: 2013-01-05 | Disposition: A | Discharge status: Home / Self Care | Payer: Medicare Other | Source: Ambulatory Visit | Attending: Surgical | Admitting: Surgical

## 2013-01-05 ENCOUNTER — Encounter (HOSPITAL_COMMUNITY)
Admission: RE | Admit: 2013-01-05 | Discharge: 2013-01-05 | Disposition: A | Discharge status: Home / Self Care | Payer: Medicare Other | Source: Ambulatory Visit | Attending: Orthopedic Surgery | Admitting: Orthopedic Surgery

## 2013-01-05 DIAGNOSIS — S43429A Sprain of unspecified rotator cuff capsule, initial encounter: Secondary | ICD-10-CM | POA: Insufficient documentation

## 2013-01-05 DIAGNOSIS — X58XXXA Exposure to other specified factors, initial encounter: Secondary | ICD-10-CM | POA: Insufficient documentation

## 2013-01-05 DIAGNOSIS — Z01818 Encounter for other preprocedural examination: Secondary | ICD-10-CM | POA: Insufficient documentation

## 2013-01-05 DIAGNOSIS — Z01812 Encounter for preprocedural laboratory examination: Secondary | ICD-10-CM | POA: Insufficient documentation

## 2013-01-05 DIAGNOSIS — Z01810 Encounter for preprocedural cardiovascular examination: Secondary | ICD-10-CM | POA: Insufficient documentation

## 2013-01-05 DIAGNOSIS — R911 Solitary pulmonary nodule: Secondary | ICD-10-CM | POA: Insufficient documentation

## 2013-01-05 DIAGNOSIS — J984 Other disorders of lung: Secondary | ICD-10-CM | POA: Insufficient documentation

## 2013-01-05 HISTORY — DX: Hypothyroidism, unspecified: E03.9

## 2013-01-05 HISTORY — DX: Unspecified osteoarthritis, unspecified site: M19.90

## 2013-01-05 LAB — COMPREHENSIVE METABOLIC PANEL
ALT: 14 U/L (ref 0–53)
AST: 12 U/L (ref 0–37)
Albumin: 3.6 g/dL (ref 3.5–5.2)
Alkaline Phosphatase: 92 U/L (ref 39–117)
BUN: 11 mg/dL (ref 6–23)
CO2: 32 mEq/L (ref 19–32)
Calcium: 9.8 mg/dL (ref 8.4–10.5)
Chloride: 93 mEq/L — ABNORMAL LOW (ref 96–112)
Creatinine, Ser: 0.7 mg/dL (ref 0.50–1.35)
GFR calc Af Amer: >90 mL/min (ref >90)
GFR calc non Af Amer: >90 mL/min (ref >90)
Glucose, Bld: 314 mg/dL — ABNORMAL HIGH (ref 70–99)
Potassium: 4.3 mEq/L (ref 3.5–5.1)
Sodium: 133 mEq/L — ABNORMAL LOW (ref 135–145)
Total Bilirubin: 0.3 mg/dL (ref 0.3–1.2)
Total Protein: 7.6 g/dL (ref 6.0–8.3)

## 2013-01-05 LAB — URINALYSIS, ROUTINE W REFLEX MICROSCOPIC
Bilirubin Urine: NEGATIVE
Glucose, UA: >1000 mg/dL — AB
Hgb urine dipstick: NEGATIVE
Ketones, ur: NEGATIVE mg/dL
Leukocytes, UA: NEGATIVE
Nitrite: NEGATIVE
Protein, ur: NEGATIVE mg/dL
Specific Gravity, Urine: 1.034 — ABNORMAL HIGH (ref 1.005–1.030)
Urobilinogen, UA: 0.2 mg/dL (ref 0.0–1.0)
pH: 6 (ref 5.0–8.0)

## 2013-01-05 LAB — APTT: aPTT: 31 seconds (ref 24–37)

## 2013-01-05 LAB — URINE MICROSCOPIC-ADD ON

## 2013-01-05 LAB — PROTIME-INR
INR: 0.91 (ref 0.00–1.49)
Prothrombin Time: 12.1 seconds (ref 11.6–15.2)

## 2013-01-05 LAB — CBC
HCT: 48.8 % (ref 39.0–52.0)
Hemoglobin: 17.3 g/dL — ABNORMAL HIGH (ref 13.0–17.0)
MCHC: 35.5 g/dL (ref 30.0–36.0)
RBC: 5.72 MIL/uL (ref 4.22–5.81)
WBC: 13.6 10*3/uL — ABNORMAL HIGH (ref 4.0–10.5)

## 2013-01-05 NOTE — Progress Notes (Signed)
CMP and CBC  Results faxed via EPIC to Dr Darrelyn Hillock.

## 2013-01-05 NOTE — Patient Instructions (Signed)
RICK CARRUTHERS  01/05/2013   Your procedure is scheduled on:  01/08/13    Report to [facility] at    1015 AM.  Call this number if you have problems the morning of surgery: [phone]   Remember:   Do not eat food or drink liquids after midnight.   Take these medicines the morning of surgery with A SIP OF WATER:    Do not wear jewelry,   Do not wear lotions, powders, or perfumes. .  . Men may shave face and neck.  Do not bring valuables to the hospital.  Contacts, dentures or bridgework may not be worn into surgery.  Leave suitcase in the car. After surgery it may be brought to your room.  For patients admitted to the hospital, checkout time is 11:00 AM the day of  discharge.        SEE CHG INSTRUCTION SHEET    Please read over the following fact sheets that you were given: MRSA Information, coughing and deep breathing exercises, leg exercises, IncentiveSpirometry Fact sheet                Failure to comply with these instructions may result in cancellation of your surgery.                Patient Signature ____________________________              Nurse Signature _____________________________

## 2013-01-05 NOTE — Progress Notes (Signed)
Urinalysis with micro results faxed via EPIC to Dr Gioffre.  

## 2013-01-05 NOTE — Progress Notes (Signed)
CXR results faxed via EPIC to Dr Darrelyn Hillock.

## 2013-01-08 ENCOUNTER — Encounter (HOSPITAL_COMMUNITY): Payer: Self-pay | Admitting: Anesthesiology

## 2013-01-08 ENCOUNTER — Encounter (HOSPITAL_COMMUNITY): Payer: Self-pay | Admitting: *Deleted

## 2013-01-08 ENCOUNTER — Ambulatory Visit (HOSPITAL_COMMUNITY): Payer: Medicare Other | Admitting: Anesthesiology

## 2013-01-08 ENCOUNTER — Encounter (HOSPITAL_COMMUNITY)
Admission: RE | Disposition: A | Discharge status: Home / Self Care | Payer: Self-pay | Source: Ambulatory Visit | Attending: Orthopedic Surgery

## 2013-01-08 ENCOUNTER — Observation Stay (HOSPITAL_COMMUNITY)
Admission: RE | Admit: 2013-01-08 | Discharge: 2013-01-09 | Disposition: A | Discharge status: Home / Self Care | Payer: Medicare Other | Source: Ambulatory Visit | Attending: Orthopedic Surgery | Admitting: Orthopedic Surgery

## 2013-01-08 DIAGNOSIS — Z794 Long term (current) use of insulin: Secondary | ICD-10-CM | POA: Insufficient documentation

## 2013-01-08 DIAGNOSIS — Z9889 Other specified postprocedural states: Secondary | ICD-10-CM

## 2013-01-08 DIAGNOSIS — R599 Enlarged lymph nodes, unspecified: Secondary | ICD-10-CM | POA: Insufficient documentation

## 2013-01-08 DIAGNOSIS — M75101 Unspecified rotator cuff tear or rupture of right shoulder, not specified as traumatic: Secondary | ICD-10-CM | POA: Diagnosis present

## 2013-01-08 DIAGNOSIS — E039 Hypothyroidism, unspecified: Secondary | ICD-10-CM | POA: Insufficient documentation

## 2013-01-08 DIAGNOSIS — I2510 Atherosclerotic heart disease of native coronary artery without angina pectoris: Secondary | ICD-10-CM | POA: Insufficient documentation

## 2013-01-08 DIAGNOSIS — E119 Type 2 diabetes mellitus without complications: Secondary | ICD-10-CM | POA: Insufficient documentation

## 2013-01-08 DIAGNOSIS — R296 Repeated falls: Secondary | ICD-10-CM | POA: Insufficient documentation

## 2013-01-08 DIAGNOSIS — L97509 Non-pressure chronic ulcer of other part of unspecified foot with unspecified severity: Secondary | ICD-10-CM | POA: Insufficient documentation

## 2013-01-08 DIAGNOSIS — S43429A Sprain of unspecified rotator cuff capsule, initial encounter: Principal | ICD-10-CM | POA: Insufficient documentation

## 2013-01-08 DIAGNOSIS — E785 Hyperlipidemia, unspecified: Secondary | ICD-10-CM | POA: Insufficient documentation

## 2013-01-08 DIAGNOSIS — J45909 Unspecified asthma, uncomplicated: Secondary | ICD-10-CM | POA: Insufficient documentation

## 2013-01-08 DIAGNOSIS — M19019 Primary osteoarthritis, unspecified shoulder: Secondary | ICD-10-CM | POA: Insufficient documentation

## 2013-01-08 DIAGNOSIS — D72820 Lymphocytosis (symptomatic): Secondary | ICD-10-CM | POA: Insufficient documentation

## 2013-01-08 DIAGNOSIS — Z79899 Other long term (current) drug therapy: Secondary | ICD-10-CM | POA: Insufficient documentation

## 2013-01-08 DIAGNOSIS — I10 Essential (primary) hypertension: Secondary | ICD-10-CM | POA: Insufficient documentation

## 2013-01-08 DIAGNOSIS — M12811 Other specific arthropathies, not elsewhere classified, right shoulder: Secondary | ICD-10-CM | POA: Diagnosis present

## 2013-01-08 HISTORY — PX: SHOULDER OPEN ROTATOR CUFF REPAIR: SHX2407

## 2013-01-08 LAB — GLUCOSE, CAPILLARY
Glucose-Capillary: 284 mg/dL — ABNORMAL HIGH (ref 70–99)
Glucose-Capillary: 268 mg/dL — ABNORMAL HIGH (ref 70–99)
Glucose-Capillary: 203 mg/dL — ABNORMAL HIGH (ref 70–99)

## 2013-01-08 SURGERY — REPAIR, ROTATOR CUFF, OPEN
Anesthesia: General | Site: Shoulder | Laterality: Right | Wound class: Clean

## 2013-01-08 MED ORDER — HYDROMORPHONE HCL PF 1 MG/ML IJ SOLN
0.5000 mg | INTRAMUSCULAR | Status: DC | PRN
  Administered 2013-01-08 (×2): 1 mg via INTRAVENOUS
  Administered 2013-01-08: 0.5 mg via INTRAVENOUS
  Administered 2013-01-09: 1 mg via INTRAVENOUS
  Filled 2013-01-08 (×4): qty 1

## 2013-01-08 MED ORDER — EPHEDRINE SULFATE 50 MG/ML IJ SOLN
INTRAMUSCULAR | Status: DC | PRN
  Administered 2013-01-08: 10 mg via INTRAVENOUS

## 2013-01-08 MED ORDER — SUCCINYLCHOLINE CHLORIDE 20 MG/ML IJ SOLN
INTRAMUSCULAR | Status: DC | PRN
  Administered 2013-01-08: 100 mg via INTRAVENOUS

## 2013-01-08 MED ORDER — METFORMIN HCL 500 MG PO TABS
1000.0000 mg | ORAL_TABLET | Freq: Two times a day (BID) | ORAL | Status: DC
  Administered 2013-01-08: 1000 mg via ORAL
  Filled 2013-01-08 (×4): qty 2

## 2013-01-08 MED ORDER — BUPIVACAINE LIPOSOME 1.3 % IJ SUSP
INTRAMUSCULAR | Status: DC | PRN
  Administered 2013-01-08: 20 mL

## 2013-01-08 MED ORDER — PROPOFOL INFUSION 10 MG/ML OPTIME
INTRAVENOUS | Status: DC | PRN
  Administered 2013-01-08: 250 mL via INTRAVENOUS

## 2013-01-08 MED ORDER — FENTANYL CITRATE 0.05 MG/ML IJ SOLN
INTRAMUSCULAR | Status: DC | PRN
  Administered 2013-01-08 (×2): 25 ug via INTRAVENOUS
  Administered 2013-01-08 (×4): 50 ug via INTRAVENOUS

## 2013-01-08 MED ORDER — NEOSTIGMINE METHYLSULFATE 1 MG/ML IJ SOLN
INTRAMUSCULAR | Status: DC | PRN
  Administered 2013-01-08: 5 mg via INTRAVENOUS

## 2013-01-08 MED ORDER — BISACODYL 10 MG RE SUPP: 10.0000 mg | Freq: Every day | RECTAL | Status: DC | PRN

## 2013-01-08 MED ORDER — LIDOCAINE HCL (CARDIAC) 20 MG/ML IV SOLN
INTRAVENOUS | Status: DC | PRN
  Administered 2013-01-08: 30 mg via INTRAVENOUS

## 2013-01-08 MED ORDER — ALBUTEROL SULFATE HFA 108 (90 BASE) MCG/ACT IN AERS: 2.0000 | INHALATION_SPRAY | RESPIRATORY_TRACT | Status: DC

## 2013-01-08 MED ORDER — POLYETHYLENE GLYCOL 3350 17 G PO PACK: 17.0000 g | PACK | Freq: Every day | ORAL | Status: DC | PRN

## 2013-01-08 MED ORDER — BACITRACIN-NEOMYCIN-POLYMYXIN 400-5-5000 EX OINT
TOPICAL_OINTMENT | CUTANEOUS | Status: AC
  Filled 2013-01-08: qty 1

## 2013-01-08 MED ORDER — LACTATED RINGERS IV SOLN
INTRAVENOUS | Status: DC | PRN
  Administered 2013-01-08: 12:00:00 via INTRAVENOUS

## 2013-01-08 MED ORDER — FLEET ENEMA 7-19 GM/118ML RE ENEM: 1.0000 | ENEMA | Freq: Once | RECTAL | Status: AC | PRN

## 2013-01-08 MED ORDER — LACTATED RINGERS IV SOLN: INTRAVENOUS | Status: DC

## 2013-01-08 MED ORDER — HYDROMORPHONE HCL PF 1 MG/ML IJ SOLN
INTRAMUSCULAR | Status: AC
  Filled 2013-01-08: qty 1

## 2013-01-08 MED ORDER — ONDANSETRON HCL 4 MG/2ML IJ SOLN
INTRAMUSCULAR | Status: DC | PRN
  Administered 2013-01-08: 4 mg via INTRAVENOUS

## 2013-01-08 MED ORDER — GLYCOPYRROLATE 0.2 MG/ML IJ SOLN
INTRAMUSCULAR | Status: DC | PRN
  Administered 2013-01-08: 0.6 mg via INTRAVENOUS

## 2013-01-08 MED ORDER — METHOCARBAMOL 100 MG/ML IJ SOLN
500.0000 mg | Freq: Four times a day (QID) | INTRAMUSCULAR | Status: DC | PRN
  Administered 2013-01-08: 500 mg via INTRAVENOUS
  Filled 2013-01-08: qty 5

## 2013-01-08 MED ORDER — ONDANSETRON HCL 4 MG/2ML IJ SOLN: 4.0000 mg | Freq: Four times a day (QID) | INTRAMUSCULAR | Status: DC | PRN

## 2013-01-08 MED ORDER — PROMETHAZINE HCL 25 MG/ML IJ SOLN: 6.2500 mg | INTRAMUSCULAR | Status: DC | PRN

## 2013-01-08 MED ORDER — LACTATED RINGERS IV SOLN
INTRAVENOUS | Status: DC
  Administered 2013-01-08: 18:00:00 via INTRAVENOUS

## 2013-01-08 MED ORDER — ACETAMINOPHEN 325 MG PO TABS: 650.0000 mg | ORAL_TABLET | Freq: Four times a day (QID) | ORAL | Status: DC | PRN

## 2013-01-08 MED ORDER — PHENYLEPHRINE HCL 10 MG/ML IJ SOLN
10000.0000 ug | INTRAVENOUS | Status: DC | PRN
  Administered 2013-01-08: 20 ug/min via INTRAVENOUS

## 2013-01-08 MED ORDER — ATORVASTATIN CALCIUM 10 MG PO TABS
10.0000 mg | ORAL_TABLET | Freq: Every day | ORAL | Status: DC
  Filled 2013-01-08: qty 1

## 2013-01-08 MED ORDER — PHENYLEPHRINE HCL 10 MG/ML IJ SOLN
INTRAMUSCULAR | Status: DC | PRN
  Administered 2013-01-08 (×2): 20 ug via INTRAVENOUS
  Administered 2013-01-08: 40 ug via INTRAVENOUS

## 2013-01-08 MED ORDER — CEFAZOLIN SODIUM 1-5 GM-% IV SOLN
1.0000 g | Freq: Four times a day (QID) | INTRAVENOUS | Status: AC
  Administered 2013-01-08 – 2013-01-09 (×3): 1 g via INTRAVENOUS
  Filled 2013-01-08 (×3): qty 50

## 2013-01-08 MED ORDER — PAROXETINE HCL 20 MG PO TABS
40.0000 mg | ORAL_TABLET | Freq: Every day | ORAL | Status: DC
  Filled 2013-01-08: qty 2

## 2013-01-08 MED ORDER — ACETAMINOPHEN 650 MG RE SUPP: 650.0000 mg | Freq: Four times a day (QID) | RECTAL | Status: DC | PRN

## 2013-01-08 MED ORDER — ALBUTEROL SULFATE HFA 108 (90 BASE) MCG/ACT IN AERS
2.0000 | INHALATION_SPRAY | RESPIRATORY_TRACT | Status: DC
  Filled 2013-01-08: qty 6.7

## 2013-01-08 MED ORDER — INSULIN ASPART 100 UNIT/ML ~~LOC~~ SOLN
0.0000 [IU] | Freq: Three times a day (TID) | SUBCUTANEOUS | Status: DC
Start: 2013-01-08 — End: 2013-01-08
  Administered 2013-01-08: 8 [IU] via SUBCUTANEOUS
  Filled 2013-01-08: qty 1

## 2013-01-08 MED ORDER — MENTHOL 3 MG MT LOZG: 1.0000 | LOZENGE | OROMUCOSAL | Status: DC | PRN

## 2013-01-08 MED ORDER — PHENOL 1.4 % MT LIQD: 1.0000 | OROMUCOSAL | Status: DC | PRN

## 2013-01-08 MED ORDER — HYDROMORPHONE HCL PF 1 MG/ML IJ SOLN
0.2500 mg | INTRAMUSCULAR | Status: DC | PRN
  Administered 2013-01-08 (×4): 0.5 mg via INTRAVENOUS

## 2013-01-08 MED ORDER — LEVOTHYROXINE SODIUM 50 MCG PO TABS
50.0000 ug | ORAL_TABLET | Freq: Every day | ORAL | Status: DC
Start: 2013-01-09 — End: 2013-01-09
  Filled 2013-01-08 (×2): qty 1

## 2013-01-08 MED ORDER — MIDAZOLAM HCL 5 MG/5ML IJ SOLN
INTRAMUSCULAR | Status: DC | PRN
  Administered 2013-01-08: 1 mg via INTRAVENOUS

## 2013-01-08 MED ORDER — CEFAZOLIN SODIUM-DEXTROSE 2-3 GM-% IV SOLR
INTRAVENOUS | Status: AC
  Filled 2013-01-08: qty 50

## 2013-01-08 MED ORDER — BUPIVACAINE LIPOSOME 1.3 % IJ SUSP
20.0000 mL | Freq: Once | INTRAMUSCULAR | Status: DC
  Filled 2013-01-08: qty 20

## 2013-01-08 MED ORDER — ONDANSETRON HCL 4 MG PO TABS: 4.0000 mg | ORAL_TABLET | Freq: Four times a day (QID) | ORAL | Status: DC | PRN

## 2013-01-08 MED ORDER — INSULIN ASPART 100 UNIT/ML ~~LOC~~ SOLN
0.0000 [IU] | Freq: Three times a day (TID) | SUBCUTANEOUS | Status: DC
  Administered 2013-01-08: 3 [IU] via SUBCUTANEOUS

## 2013-01-08 MED ORDER — 0.9 % SODIUM CHLORIDE (POUR BTL) OPTIME
TOPICAL | Status: DC | PRN
  Administered 2013-01-08: 1000 mL

## 2013-01-08 MED ORDER — THROMBIN 5000 UNITS EX SOLR
CUTANEOUS | Status: AC
  Filled 2013-01-08: qty 5000

## 2013-01-08 MED ORDER — ALBUTEROL SULFATE HFA 108 (90 BASE) MCG/ACT IN AERS: 2.0000 | INHALATION_SPRAY | Freq: Four times a day (QID) | RESPIRATORY_TRACT | Status: DC | PRN

## 2013-01-08 MED ORDER — METHOCARBAMOL 500 MG PO TABS
500.0000 mg | ORAL_TABLET | Freq: Four times a day (QID) | ORAL | Status: DC | PRN
  Administered 2013-01-09: 500 mg via ORAL
  Filled 2013-01-08: qty 1

## 2013-01-08 MED ORDER — CISATRACURIUM BESYLATE (PF) 10 MG/5ML IV SOLN
INTRAVENOUS | Status: DC | PRN
  Administered 2013-01-08 (×2): 10 mg via INTRAVENOUS

## 2013-01-08 MED ORDER — THROMBIN 5000 UNITS EX SOLR
CUTANEOUS | Status: DC | PRN
  Administered 2013-01-08: 5000 [IU] via TOPICAL

## 2013-01-08 MED ORDER — HYDROCODONE-ACETAMINOPHEN 5-325 MG PO TABS
1.0000 | ORAL_TABLET | ORAL | Status: DC | PRN
  Administered 2013-01-08 (×2): 2 via ORAL
  Filled 2013-01-08 (×2): qty 2

## 2013-01-08 MED ORDER — OXYCODONE-ACETAMINOPHEN 5-325 MG PO TABS
1.0000 | ORAL_TABLET | ORAL | Status: DC | PRN
  Administered 2013-01-09 (×2): 2 via ORAL
  Filled 2013-01-08 (×2): qty 2

## 2013-01-08 MED ORDER — KETAMINE HCL 10 MG/ML IJ SOLN
INTRAMUSCULAR | Status: DC | PRN
  Administered 2013-01-08 (×2): 15 mg via INTRAVENOUS

## 2013-01-08 MED ORDER — METOPROLOL TARTRATE 50 MG PO TABS
50.0000 mg | ORAL_TABLET | Freq: Two times a day (BID) | ORAL | Status: DC
  Administered 2013-01-08: 50 mg via ORAL
  Filled 2013-01-08 (×3): qty 1

## 2013-01-08 MED ORDER — SODIUM CHLORIDE 0.9 % IJ SOLN
INTRAMUSCULAR | Status: DC | PRN
  Administered 2013-01-08: 20 mL

## 2013-01-08 MED ORDER — CEFAZOLIN SODIUM-DEXTROSE 2-3 GM-% IV SOLR
2.0000 g | INTRAVENOUS | Status: AC
  Administered 2013-01-08: 2 g via INTRAVENOUS

## 2013-01-08 MED ORDER — ALBUTEROL SULFATE HFA 108 (90 BASE) MCG/ACT IN AERS
INHALATION_SPRAY | RESPIRATORY_TRACT | Status: DC | PRN
  Administered 2013-01-08: 2 via RESPIRATORY_TRACT

## 2013-01-08 MED ORDER — ALBUTEROL SULFATE HFA 108 (90 BASE) MCG/ACT IN AERS
INHALATION_SPRAY | RESPIRATORY_TRACT | Status: AC
  Filled 2013-01-08: qty 6.7

## 2013-01-08 SURGICAL SUPPLY — 47 items
ANCHOR PEEK ZIP 5.5 NDL NO2 (Orthopedic Implant) ×6 IMPLANT
BAG ZIPLOCK 12X15 (MISCELLANEOUS) ×2 IMPLANT
BLADE OSCILLATING/SAGITTAL (BLADE) ×1
BLADE SW THK.38XMED LNG THN (BLADE) ×1 IMPLANT
BNDG COHESIVE 6X5 TAN NS LF (GAUZE/BANDAGES/DRESSINGS) ×2 IMPLANT
BUR OVAL CARBIDE 4.0 (BURR) ×2 IMPLANT
CLEANER TIP ELECTROSURG 2X2 (MISCELLANEOUS) ×2 IMPLANT
CLOTH BEACON ORANGE TIMEOUT ST (SAFETY) ×2 IMPLANT
DECANTER SPIKE VIAL GLASS SM (MISCELLANEOUS) ×4 IMPLANT
DERMABOND ADVANCED (GAUZE/BANDAGES/DRESSINGS) ×1
DERMABOND ADVANCED .7 DNX12 (GAUZE/BANDAGES/DRESSINGS) ×1 IMPLANT
DRAPE POUCH INSTRU U-SHP 10X18 (DRAPES) ×2 IMPLANT
DRSG AQUACEL AG ADV 3.5X 6 (GAUZE/BANDAGES/DRESSINGS) ×2 IMPLANT
DRSG EMULSION OIL 3X3 NADH (GAUZE/BANDAGES/DRESSINGS) ×2 IMPLANT
DRSG PAD ABDOMINAL 8X10 ST (GAUZE/BANDAGES/DRESSINGS) ×4 IMPLANT
DURAPREP 26ML APPLICATOR (WOUND CARE) ×2 IMPLANT
ELECT REM PT RETURN 9FT ADLT (ELECTROSURGICAL) ×2
ELECTRODE REM PT RTRN 9FT ADLT (ELECTROSURGICAL) ×1 IMPLANT
GLOVE BIOGEL PI IND STRL 8 (GLOVE) ×1 IMPLANT
GLOVE BIOGEL PI INDICATOR 8 (GLOVE) ×1
GLOVE ECLIPSE 8.0 STRL XLNG CF (GLOVE) ×4 IMPLANT
GLOVE SURG SS PI 6.5 STRL IVOR (GLOVE) ×6 IMPLANT
GOWN PREVENTION PLUS LG XLONG (DISPOSABLE) ×4 IMPLANT
KIT BASIN OR (CUSTOM PROCEDURE TRAY) ×2 IMPLANT
MANIFOLD NEPTUNE II (INSTRUMENTS) ×2 IMPLANT
NEEDLE MA TROC 1/2 (NEEDLE) IMPLANT
NS IRRIG 1000ML POUR BTL (IV SOLUTION) IMPLANT
PACK SHOULDER CUSTOM OPM052 (CUSTOM PROCEDURE TRAY) ×2 IMPLANT
PASSER SUT SWANSON 36MM LOOP (INSTRUMENTS) IMPLANT
PATCH TISSUE MEND 3X3CM (Orthopedic Implant) ×2 IMPLANT
POSITIONER SURGICAL ARM (MISCELLANEOUS) ×2 IMPLANT
SLING ARM IMMOBILIZER LRG (SOFTGOODS) ×2 IMPLANT
SPONGE LAP 4X18 X RAY DECT (DISPOSABLE) ×2 IMPLANT
SPONGE SURGIFOAM ABS GEL 100 (HEMOSTASIS) IMPLANT
STAPLER VISISTAT 35W (STAPLE) ×2 IMPLANT
STRIP CLOSURE SKIN 1/2X4 (GAUZE/BANDAGES/DRESSINGS) IMPLANT
SUCTION FRAZIER 12FR DISP (SUCTIONS) ×2 IMPLANT
SUT BONE WAX W31G (SUTURE) ×2 IMPLANT
SUT ETHIBOND NAB CT1 #1 30IN (SUTURE) ×4 IMPLANT
SUT MNCRL AB 4-0 PS2 18 (SUTURE) ×2 IMPLANT
SUT VIC AB 0 CT1 27 (SUTURE) ×1
SUT VIC AB 0 CT1 27XBRD ANTBC (SUTURE) ×1 IMPLANT
SUT VIC AB 1 CT1 27 (SUTURE) ×2
SUT VIC AB 1 CT1 27XBRD ANTBC (SUTURE) ×2 IMPLANT
SUT VIC AB 2-0 CT1 27 (SUTURE) ×2
SUT VIC AB 2-0 CT1 27XBRD (SUTURE) ×2 IMPLANT
TOWEL OR 17X26 10 PK STRL BLUE (TOWEL DISPOSABLE) ×4 IMPLANT

## 2013-01-08 NOTE — Plan of Care (Signed)
Problem: Diagnosis - Type of Surgery Goal: General Surgical Patient Education (See Patient Education module for education specifics) Right rotator cuff     

## 2013-01-08 NOTE — Anesthesia Preprocedure Evaluation (Addendum)
Anesthesia Evaluation  Patient identified by MRN, date of birth, ID band Patient awake    Reviewed: Allergy & Precautions, H&P , NPO status , Patient's Chart, lab work & pertinent test results, reviewed documented beta blocker date and time   History of Anesthesia Complications Negative for: history of anesthetic complications  Airway Mallampati: I TM Distance: >3 FB Neck ROM: Full    Dental  (+) Poor Dentition and Dental Advisory Given,    Pulmonary asthma , COPD COPD inhaler, Current Smoker,  breath sounds clear to auscultation  Pulmonary exam normal       Cardiovascular hypertension, Pt. on medications and Pt. on home beta blockers Rhythm:Regular Rate:Normal     Neuro/Psych PSYCHIATRIC DISORDERS Depression negative neurological ROS     GI/Hepatic negative GI ROS, Neg liver ROS,   Endo/Other  diabetes, Well Controlled, Type 2, Insulin DependentHypothyroidism Morbid obesity  Renal/GU negative Renal ROS     Musculoskeletal negative musculoskeletal ROS (+)   Abdominal (+) + obese,   Peds  Hematology  (+) Blood dyscrasia, ,   Anesthesia Other Findings   Reproductive/Obstetrics                          Anesthesia Physical Anesthesia Plan  ASA: II  Anesthesia Plan: General   Post-op Pain Management:    Induction: Intravenous  Airway Management Planned: Oral ETT  Additional Equipment:   Intra-op Plan:   Post-operative Plan: Extubation in OR  Informed Consent: I have reviewed the patients History and Physical, chart, labs and discussed the procedure including the risks, benefits and alternatives for the proposed anesthesia with the patient or authorized representative who has indicated his/her understanding and acceptance.   Dental advisory given  Plan Discussed with: CRNA  Anesthesia Plan Comments:         Anesthesia Quick Evaluation

## 2013-01-08 NOTE — Progress Notes (Signed)
Upon removal of the patient's socks in PACU, it became apparent that the patient has 2cm x 1cm x 1cm ulceration of the plantar aspect of the right great toe. The patient reports that he has been dealing with this for several months, but this information had not been relayed to Dr. Darrelyn Hillock or myself upon review of medical history. No pus was evident around the ulceration and there was no active bleeding. Ulceration was dressed with gauze in PACU. No neosporin was applied due to his allergy. Upon further investigation, it is found that the patient had an x-ray and MRI of the right great toe in February, which revealed an ulceration with no evidence of osteomyelitis. It appears that these were ordered by Renne Crigler, PA.     Dimitri Ped, PA-C

## 2013-01-08 NOTE — Transfer of Care (Signed)
Immediate Anesthesia Transfer of Care Note  Patient: Derek Calderon  Procedure(s) Performed: Procedure(s): ROTATOR CUFF REPAIR SHOULDER OPEN (Right)  Patient Location: PACU  Anesthesia Type:General  Level of Consciousness: awake, alert  and oriented  Airway & Oxygen Therapy: Patient Spontanous Breathing and Patient connected to nasal cannula oxygen  Post-op Assessment: Report given to PACU RN and Post -op Vital signs reviewed and stable  Post vital signs: stable  Complications: No apparent anesthesia complications

## 2013-01-08 NOTE — Anesthesia Postprocedure Evaluation (Signed)
Anesthesia Post Note  Patient: Derek Calderon  Procedure(s) Performed: Procedure(s) (LRB): ROTATOR CUFF REPAIR SHOULDER OPEN (Right)  Anesthesia type: General  Patient location: PACU  Post pain: Pain level controlled  Post assessment: Post-op Vital signs reviewed  Last Vitals:  Filed Vitals:   01/08/13 2109  BP: 135/80  Pulse: 79  Temp: 36.6 C  Resp: 16    Post vital signs: Reviewed  Level of consciousness: sedated  Complications: No apparent anesthesia complications

## 2013-01-08 NOTE — Progress Notes (Signed)
Patient alert and oriented.  He has been educated about the need to ask for assistance when out of bed and his fall risk status. Red socks and fall risk bracelet placed on patient. After education patient was found to be walking to the restroom without asking for assistance.  Patient educated again about fall risk. Will continue to monitor.

## 2013-01-08 NOTE — Interval H&P Note (Signed)
History and Physical Interval Note:  01/08/2013 12:46 PM  Derek Calderon  has presented today for surgery, with the diagnosis of RIGHT SHOULDER ROTATOR CUFF TEAR  The various methods of treatment have been discussed with the patient and family. After consideration of risks, benefits and other options for treatment, the patient has consented to  Procedure(s): ROTATOR CUFF REPAIR SHOULDER OPEN (Right) as a surgical intervention .  The patient's history has been reviewed, patient examined, no change in status, stable for surgery.  I have reviewed the patient's chart and labs.  Questions were answered to the patient's satisfaction.     Winter Trefz A

## 2013-01-08 NOTE — Brief Op Note (Signed)
01/08/2013  2:30 PM  PATIENT:  Derek Calderon  62 y.o. male  PRE-OPERATIVE DIAGNOSIS:  RIGHT SHOULDER ROTATOR CUFF TEAR  POST-OPERATIVE DIAGNOSIS:  RIGHT SHOULDER ROTATOR CUFF TEAR,COMPLEX,COMPLETE,RETRACTED TYPE>  PROCEDURE:  Procedure(s): ROTATOR CUFF REPAIR SHOULDER OPEN (Right).SEVERE,COMPLEX,RETRACTED TEAR.3-Anchors and a TISSUE MEND GRAFT were used.  SURGEON:  Surgeon(s) and Role:    * Jacki Cones, MD - Primary  PHYSICIAN ASSISTANT: Dimitri Ped PA  ASSISTANTS: Dimitri Ped PA  ANESTHESIA:   general  EBL:  Total I/O In: 0  Out: 100 [Blood:100]  BLOOD ADMINISTERED:none  DRAINS: none   LOCAL MEDICATIONS USED:  BUPIVICAINE 20cc.   SPECIMEN:  No Specimen  DISPOSITION OF SPECIMEN:  N/A  COUNTS:  YES  TOURNIQUET:  * No tourniquets in log *  DICTATION: .Other Dictation: Dictation Number [id number]  PLAN OF CARE: Admit for overnight observation  PATIENT DISPOSITION:  Stable in OR   Delay start of Pharmacological VTE agent (>24hrs) due to surgical blood loss or risk of bleeding: yes

## 2013-01-09 ENCOUNTER — Ambulatory Visit (HOSPITAL_COMMUNITY): Payer: Medicare Other

## 2013-01-09 ENCOUNTER — Encounter (HOSPITAL_COMMUNITY): Payer: Self-pay | Admitting: Orthopedic Surgery

## 2013-01-09 LAB — GLUCOSE, CAPILLARY: Glucose-Capillary: 222 mg/dL — ABNORMAL HIGH (ref 70–99)

## 2013-01-09 MED ORDER — DOXYCYCLINE HYCLATE 50 MG PO CAPS: 100.0000 mg | ORAL_CAPSULE | Freq: Two times a day (BID) | ORAL | Status: DC

## 2013-01-09 MED ORDER — METHOCARBAMOL 500 MG PO TABS: 500.0000 mg | ORAL_TABLET | Freq: Four times a day (QID) | ORAL | Status: DC | PRN

## 2013-01-09 MED ORDER — OXYCODONE-ACETAMINOPHEN 5-325 MG PO TABS: 1.0000 | ORAL_TABLET | ORAL | Status: DC | PRN

## 2013-01-09 NOTE — Op Note (Signed)
NAMEJASAIAH, Derek Calderon NO.:  [id number]  MEDICAL RECORD NO.:  [id number]  LOCATION:  1601                         FACILITY:  [facility]  PHYSICIAN:  Georges Lynch. Nida Manfredi, M.D.DATE OF BIRTH:  Dec 24, 1950  DATE OF PROCEDURE:  01/08/2013 DATE OF DISCHARGE:                              OPERATIVE REPORT   PREOPERATIVE DIAGNOSES: 1. Complete severe complex retracted tear of the rotator cuff on the     right. 2. Severe impingement syndrome, right shoulder.  POSTOPERATIVE DIAGNOSES: 1. Complete severe complex retracted tear of the rotator cuff on the     right. 2. Severe impingement syndrome, right shoulder.  OPERATION: 1. Open acromionectomy and acromioplasty, right shoulder. 2. Repair of a portion that was remaining of the rotator cuff tendon,     right shoulder utilizing 3 anchors and a TissueMend graft.  PROCEDURE IN DETAIL:  Under general anesthesia, routine orthopedic prep and draping of the right upper extremity was carried out.  Appropriate time-out was carried out.  I also marked the appropriate right arm in the holding area.  At this time, the patient had 2 g of IV Ancef.  An incision was made over the anterior aspect of the right shoulder. Bleeders were identified and cauterized.  At this time, I then went down, separated the deltoid tendon muscle in usual fashion.  Tendon was separated from the acromion.  I then split a small part of the deltoid muscle.  At this time, we protected the underlying humeral head with a Bennett retractor and utilized the oscillating saw and a bur to bur down the acromion and do an acromioplasty and partial acromionectomy.  After this was done, we irrigated out the shoulder, I then utilized the bur to bur down the lateral articular surface of the humerus until we had bleeding bone.  I then went and searched and searched for the rotator cuff.  There was a small part of the cup retracted laterally and posteriorly.  I secured the  remaining part of the tendon with 2 Kocher clamps, brought it forward as much as I could, and inserted 2 anchors in the proximal humerus and anchored the tendon back to the bleeding bone. Following that, we then inserted a third anchor and utilized a TissueMend graft for reinforcement of the repair.  Note, this patient will some day most likely need a reverse shoulder.  There was just small pieces of his rotator cuff remaining, this was a very difficult procedure.  I searched posteriorly, anterior, laterally and over the entire shoulders to see if we can find any more tendon and there was none.  The original insertion of the tendon was severely necrotic as well.  I thoroughly irrigated out the area and reapproximated the main part of the wound in usual fashion with 20 mL of Exparel under the wound site.  Sterile dressings were applied.  The patient was placed in a shoulder immobilizer.          ______________________________ Georges Lynch Darrelyn Hillock, M.D.     RAG/MEDQ  D:  01/08/2013  T:  01/09/2013  Job:  [id number]

## 2013-01-09 NOTE — Evaluation (Signed)
Occupational Therapy Evaluation Patient Details Name: Derek Calderon MRN: [id number] DOB: 1950/07/26 Today's Date: 01/09/2013 Time: 0865-7846 OT Time Calculation (min): 19 min  OT Assessment / Plan / Recommendation History of present illness Pt admitted for R rotator cuff repair.   Clinical Impression   Pt is extremely impulsive with activity and needs frequent cues to slow down and not move R shoulder. Emphasized need to wear sling except for bathing/dressing/exercise and how to ensure proper positioning. Pt states his sister can hep with ADL at d/c. When pt attempts to perform UB bath, he moves R shoulder despite repeated cues to keep shoulder still. Explained it would be good for sister to help until pt remembers to not move shoulder during activity.     OT Assessment  Progress rehab of shoulder as ordered by MD at follow-up appointment    Follow Up Recommendations  No OT follow up;Supervision/Assistance - 24 hour    Barriers to Discharge      Equipment Recommendations  None recommended by OT    Recommendations for Other Services    Frequency       Precautions / Restrictions Precautions Precautions: Shoulder Shoulder Interventions: Shoulder sling/immobilizer;Off for dressing/bathing/exercises Precaution Booklet Issued: Yes (comment) Precaution Comments: Issued shoulder care handout and reviewed all information.  Restrictions Weight Bearing Restrictions: Yes RUE Weight Bearing: Non weight bearing   Pertinent Vitals/Pain 10/10 after activity. Down to a 8 with rest.    ADL  ADL Comments: Pt is very impulsive. Note in chart, pt initially not compliant with sling but was able to verbalize to therapist that he knows he needs to wear sling all the time except to dress/bathe. When he came out of sling today for education, pt moving R UE at shoulder several times and OT instructed him to NOT move his shoulder and emphasized the importance of not moving it for healing. He states his  sister can help with sling, dressing, bath at home. Pt states he heard a small "pop" when he was trying to wash under R arm using seesaw method  and pt noted to be very quick with movements and not cautious. Pt states pain didnt intensify and it felt ok afterwards. Nursing made aware of "pop." Reemphsized before finishing sessino to go slow, not move shoulder, and sling wear.     OT Diagnosis:    OT Problem List:   OT Treatment Interventions:     OT Goals(Current goals can be found in the care plan section)    Visit Information  Last OT Received On: 01/09/13 Assistance Needed: +1 History of Present Illness: Pt admitted for R rotator cuff repair.       Prior Functioning     Home Living Living Arrangements: Alone Additional Comments: states his sister can help at discharge. they will be staying at someone else's house.  Prior Function Level of Independence: Independent Communication Communication: No difficulties         Vision/Perception     Cognition  Cognition Arousal/Alertness: Awake/alert Behavior During Therapy: Impulsive    Extremity/Trunk Assessment       Mobility       Exercise Other Exercises Other Exercises: performed elbow X10 flexion/extension seated with extension down to lap only. (not full extension) wrist and hand pt verbalized understanding but was ready to get back into sling so didnt perform those.  Donning/doffing shirt without moving shoulder: Patient able to independently direct caregiver Method for sponge bathing under operated UE: Patient able to independently direct caregiver Donning/doffing  sling/immobilizer: Patient able to independently direct caregiver Correct positioning of sling/immobilizer: Patient able to independently direct caregiver ROM for elbow, wrist and digits of operated UE: Supervision/safety Sling wearing schedule (on at all times/off for ADL's): Patient able to independently direct caregiver;Independent Proper positioning  of operated UE when showering: Independent;Patient able to independently direct caregiver Positioning of UE while sleeping: Patient able to independently direct caregiver   Balance     End of Session OT - End of Session Activity Tolerance: Patient tolerated treatment well Patient left: Other (comment) (on window seat.)  GO Functional Assessment Tool Used: clinical judgement Functional Limitation: Self care Self Care Current Status (Z6109): At least 20 percent but less than 40 percent impaired, limited or restricted Self Care Goal Status (U0454): At least 20 percent but less than 40 percent impaired, limited or restricted Self Care Discharge Status [phone]): At least 20 percent but less than 40 percent impaired, limited or restricted   Lennox Laity [phone] 01/09/2013, 9:56 AM

## 2013-01-09 NOTE — Progress Notes (Signed)
Patient up walking in room without assistance with his sling removed.  I found the sling bundled up with his belongings and reapplied it.  I educated the patient on the importance of wearing his sling.  I informed the patient that he could injure his right arm/shoulder if he did not wear it as ordered.  Will continue to monitor, Dr. Darrelyn Hillock made aware during his morning rounds.

## 2013-01-09 NOTE — Progress Notes (Signed)
Patient refuses to wear ordered continuous pulse oximetry.  He has taken it off his finger multiple occasions.  Will continue to check oxygen saturation and heart rate intermittently.

## 2013-01-09 NOTE — Progress Notes (Signed)
   Subjective: 1 Day Post-Op Procedure(s) (LRB): ROTATOR CUFF REPAIR SHOULDER OPEN (Right) Patient reports pain as mild.   Patient seen in rounds with Dr. Darrelyn Hillock. Patient is doing fair this morning. No SOB or chest pain. He has not been compliant with many things, including wearing his sling, wearing pulse oximeter, and even trying to smoke in the stairwell. No numbness or tingling in his arm or hand.  Plan is to go Home after hospital stay.  Objective: Vital signs in last 24 hours: Temp:  [97.2 F (36.2 C)-98.2 F (36.8 C)] 98.2 F (36.8 C) (07/11 0519) Pulse Rate:  [57-84] 67 (07/11 0519) Resp:  [12-20] 16 (07/11 0519) BP: (90-146)/(52-86) 90/65 mmHg (07/11 0519) SpO2:  [93 %-100 %] 94 % (07/11 0519) Weight:  [118.842 kg (262 lb)] 118.842 kg (262 lb) (07/10 1600)  Intake/Output from previous day:  Intake/Output Summary (Last 24 hours) at 01/09/13 0709 Last data filed at 01/09/13 0300  Gross per 24 hour  Intake 2628.33 ml  Output    750 ml  Net 1878.33 ml     EXAM General - Patient is Alert and Oriented Extremity - Neurologically intact Intact pulses distally Dressing/Incision - clean, dry, no drainage Motor Function - intact, moving hand and fingers well on exam.   Past Medical History  Diagnosis Date  . Lymphocytosis   . Mediastinal lymphadenopathy   . Diabetes mellitus   . Hyperlipidemia   . HTN (hypertension)   . Asthma   . Depression   . Fracture of fibula, distal, right, closed 02/01/2012  . Hypothyroidism   . Arthritis     Assessment/Plan: 1 Day Post-Op Procedure(s) (LRB): ROTATOR CUFF REPAIR SHOULDER OPEN (Right) Active Problems:   Rotator cuff tear arthropathy of right shoulder  Estimated body mass index is 33.62 kg/(m^2) as calculated from the following:   Height as of this encounter: 6\' 2"  (1.88 m).   Weight as of this encounter: 118.842 kg (262 lb). Advance diet D/C IV fluids Discharge home  Wear sling at all times  Derek Calderon is doing well in  regards to his shoulder. We are planning on discharging him home today after he meets with OT. Once again it was stressed that he wear the sling at all times. Discharge instructions given and follow up planned. In addition to the shoulder, a superficial INFECTION located on the plantar aspect of the right great toe became apparent in PACU yesterday. He reports that he has being treated elsewhere for this. At this time, he will be discharge home with antibiotic coverage for this infection.    Derek Calderon LAUREN 01/09/2013, 7:09 AM

## 2013-01-12 NOTE — Discharge Summary (Signed)
Physician Discharge Summary   Patient ID: Derek Calderon MRN: [id number] DOB/AGE: 02/23/1951 62 y.o.  Admit date: 01/08/2013 Discharge date: 01/09/2013  Primary Diagnosis: Right shoulder rotator cuff tear  Admission Diagnoses:  Past Medical History  Diagnosis Date  . Lymphocytosis   . Mediastinal lymphadenopathy   . Diabetes mellitus   . Hyperlipidemia   . HTN (hypertension)   . Asthma   . Depression   . Fracture of fibula, distal, right, closed 02/01/2012  . Hypothyroidism   . Arthritis    Discharge Diagnoses:   Active Problems:   Rotator cuff tear arthropathy of right shoulder  Estimated body mass index is 33.62 kg/(m^2) as calculated from the following:   Height as of this encounter: 6\' 2"  (1.88 m).   Weight as of this encounter: 118.842 kg (262 lb).  Procedure:  Procedure(s) (LRB): ROTATOR CUFF REPAIR SHOULDER OPEN (Right)   Consults: None  HPI: The patient is a 62 year old male who presents with shoulder complaints. They are right handed and present today reporting pain, pain with overhead motions, pain with reaching and pain with lifting at the right shoulder that began 1 month ago. The patient reports that the shoulder symptoms began following a specific injury in which he fell onto his arm. The patient was previously evaluated in the emergency room. This problem has not been previously evaluated. Past treatment has included use of a sling. He has weakness in regards to trying to bring his shoulder up from the side. MRI revealed full thickness rotator cuff tear in the right shoulder.     Laboratory Data: Admission on 01/08/2013, Discharged on 01/09/2013  Component Date Value Range Status  . Glucose-Capillary 01/08/2013 284* 70 - 99 mg/dL Final  . Comment 1 32/44/0102 Documented in Chart   Final  . Comment 2 01/08/2013 Call MD NNP PA CNM   Final  . Glucose-Capillary 01/08/2013 268* 70 - 99 mg/dL Final  . Comment 1 72/53/6644 Documented in Chart   Final  . Comment  2 01/08/2013 Call MD NNP PA CNM   Final  . Glucose-Capillary 01/08/2013 203* 70 - 99 mg/dL Final  . Comment 1 03/47/4259 Documented in Chart   Final  . Comment 2 01/08/2013 Notify RN   Final  . Glucose-Capillary 01/08/2013 194* 70 - 99 mg/dL Final  . Comment 1 56/38/7564 Notify RN   Final  . Comment 2 01/08/2013 Documented in Chart   Final  . Glucose-Capillary 01/08/2013 191* 70 - 99 mg/dL Final  . Glucose-Capillary 01/09/2013 222* 70 - 99 mg/dL Final  Hospital Outpatient Visit on 01/05/2013  Component Date Value Range Status  . aPTT 01/05/2013 31  24 - 37 seconds Final  . Sodium 01/05/2013 133* 135 - 145 mEq/L Final  . Potassium 01/05/2013 4.3  3.5 - 5.1 mEq/L Final  . Chloride 01/05/2013 93* 96 - 112 mEq/L Final  . CO2 01/05/2013 32  19 - 32 mEq/L Final  . Glucose, Bld 01/05/2013 314* 70 - 99 mg/dL Final  . BUN 33/29/5188 11  6 - 23 mg/dL Final  . Creatinine, Ser 01/05/2013 0.70  0.50 - 1.35 mg/dL Final  . Calcium 41/66/0630 9.8  8.4 - 10.5 mg/dL Final  . Total Protein 01/05/2013 7.6  6.0 - 8.3 g/dL Final  . Albumin 16/07/930 3.6  3.5 - 5.2 g/dL Final  . AST 35/57/3220 12  0 - 37 U/L Final  . ALT 01/05/2013 14  0 - 53 U/L Final  . Alkaline Phosphatase 01/05/2013 92  39 - 117 U/L Final  . Total Bilirubin 01/05/2013 0.3  0.3 - 1.2 mg/dL Final  . GFR calc non Af Amer 01/05/2013 >90  >90 mL/min Final  . GFR calc Af Amer 01/05/2013 >90  >90 mL/min Final   Comment:                                 The eGFR has been calculated                          using the CKD EPI equation.                          This calculation has not been                          validated in all clinical                          situations.                          eGFR's persistently                          <90 mL/min signify                          possible Chronic Kidney Disease.  Marland Kitchen Prothrombin Time 01/05/2013 12.1  11.6 - 15.2 seconds Final  . INR 01/05/2013 0.91  0.00 - 1.49 Final  . Color, Urine  01/05/2013 YELLOW  YELLOW Final  . APPearance 01/05/2013 CLEAR  CLEAR Final  . Specific Gravity, Urine 01/05/2013 1.034* 1.005 - 1.030 Final  . pH 01/05/2013 6.0  5.0 - 8.0 Final  . Glucose, UA 01/05/2013 >1000* NEGATIVE mg/dL Final  . Hgb urine dipstick 01/05/2013 NEGATIVE  NEGATIVE Final  . Bilirubin Urine 01/05/2013 NEGATIVE  NEGATIVE Final  . Ketones, ur 01/05/2013 NEGATIVE  NEGATIVE mg/dL Final  . Protein, ur 16/04/9603 NEGATIVE  NEGATIVE mg/dL Final  . Urobilinogen, UA 01/05/2013 0.2  0.0 - 1.0 mg/dL Final  . Nitrite 54/03/8118 NEGATIVE  NEGATIVE Final  . Leukocytes, UA 01/05/2013 NEGATIVE  NEGATIVE Final  . WBC 01/05/2013 13.6* 4.0 - 10.5 K/uL Final  . RBC 01/05/2013 5.72  4.22 - 5.81 MIL/uL Final  . Hemoglobin 01/05/2013 17.3* 13.0 - 17.0 g/dL Final  . HCT 14/78/2956 48.8  39.0 - 52.0 % Final  . MCV 01/05/2013 85.3  78.0 - 100.0 fL Final  . MCH 01/05/2013 30.2  26.0 - 34.0 pg Final  . MCHC 01/05/2013 35.5  30.0 - 36.0 g/dL Final  . RDW 21/30/8657 14.0  11.5 - 15.5 % Final  . Platelets 01/05/2013 189  150 - 400 K/uL Final  . RBC / HPF 01/05/2013 0-2  <3 RBC/hpf Final  . Urine-Other 01/05/2013 MUCOUS PRESENT   Final     X-Rays:Dg Chest 2 View  01/05/2013   *RADIOLOGY REPORT*  Clinical Data: Rotator cuff tear  CHEST - 2 VIEW  Comparison: 08/22/2012  Findings: Heart size is normal.  There is no pleural effusion identified.  Decreased lung volumes.  There are coarsened interstitial markings identified bilaterally.  No focal airspace consolidation noted.  Nodule in the left  base appears increased from previous exam.  This is indeterminate.  Visualized osseous structures appear intact.  There is spondylosis identified throughout the thoracic spine.  IMPRESSION:  1.  Chronic interstitial coarsening. 2.  Indeterminate nodule the left base appears increased in size from previous exam.  Suggest further evaluation with noncontrast CT of the chest.   Original Report Authenticated By: Signa Kell, M.D.   Ct Chest Wo Contrast  01/09/2013   *RADIOLOGY REPORT*  Clinical Data: Left lung base nodule on preop chest radiograph  CT CHEST WITHOUT CONTRAST  Technique:  Multidetector CT imaging of the chest was performed following the standard protocol without IV contrast.  Comparison: Chest radiographs dated 01/05/2013.  CT chest dated 12/2010.  Findings: No suspicious pulmonary nodules.  Specifically, no left lower lobe nodule to correspond to the radiographic abnormality. Notably, a chest radiograph dated 06/07/2010 demonstrated a similar appearance which also did not have a corresponding abnormality on subsequent CT.  Minimal nodular thickening along the left fissure (series 5/image 24), unchanged, benign.  Coarse interstitial markings in the bilateral upper lobes, favored to reflect mild interstitial lung disease, similar. No pleural effusion or pneumothorax.  Visualized right thyroid is mildly heterogeneous/nodular.  Heart is normal in size.  Pericardial effusion.  Coronary atherosclerosis.  Atherosclerotic calcification of the aortic arch.  Mediastinal lymphadenopathy, including a 1.6 cm short axis right paratracheal node, grossly unchanged.  No suspicious axillary lymphadenopathy.  Visualized upper abdomen is notable for mild thickening of the right adrenal gland, unchanged.  Degenerative changes of the visualized thoracolumbar spine.  IMPRESSION: No suspicious pulmonary nodules.  Specifically, no left lower lobe pulmonary nodule is seen to correspond to the suspected radiographic abnormality.  Coarse interstitial markings in the bilateral upper lobes, favored to reflect mild interstitial lung disease.  Mild mediastinal lymphadenopathy, grossly unchanged, likely benign.   Original Report Authenticated By: Charline Bills, M.D.    EKG: Orders placed during the hospital encounter of 01/05/13  . EKG 12-LEAD  . EKG 12-LEAD     Hospital Course: Derek Calderon is a 62 y.o. who was admitted to  [facility]. They were brought to the operating room on 01/08/2013 and underwent Procedure(s): ROTATOR CUFF REPAIR SHOULDER OPEN.  Patient tolerated the procedure well and was later transferred to the recovery room and then to the orthopaedic floor for postoperative care.  They were given PO and IV analgesics for pain control following their surgery.  They were given 24 hours of postoperative antibiotics of  Anti-infectives   Start     Dose/Rate Route Frequency Ordered Stop   01/09/13 0000  doxycycline (VIBRAMYCIN) 50 MG capsule     100 mg Oral 2 times daily 01/09/13 0706     01/08/13 1830  ceFAZolin (ANCEF) IVPB 1 g/50 mL premix     1 g 100 mL/hr over 30 Minutes Intravenous Every 6 hours 01/08/13 1614 01/09/13 0718   01/08/13 1008  ceFAZolin (ANCEF) IVPB 2 g/50 mL premix     2 g 100 mL/hr over 30 Minutes Intravenous On call to O.R. 01/08/13 1008 01/08/13 1230    OT was ordered for sling instructions.  Discharge planning consulted to help with postop disposition and equipment needs.  Patient had a fair night on the evening of surgery.  Incision was healing well.  Patient was seen in rounds and was ready to go home.   Discharge Medications: Prior to Admission medications   Medication Sig Start Date End Date Taking? Authorizing Provider  atorvastatin (LIPITOR)  10 MG tablet Take 10 mg by mouth every morning.    Yes Historical Provider, MD  insulin aspart (NOVOLOG) 100 UNIT/ML injection Inject 20-25 Units into the skin 3 (three) times daily before meals. Per sliding scale If 250 - patient states will takes 25 units if 300 will take 30 units   Yes Historical Provider, MD  levothyroxine (SYNTHROID, LEVOTHROID) 50 MCG tablet Take 50 mcg by mouth every morning.    Yes Historical Provider, MD  metFORMIN (GLUCOPHAGE) 1000 MG tablet Take 1,000 mg by mouth 2 (two) times daily with a meal.    Yes Historical Provider, MD  metoprolol (LOPRESSOR) 50 MG tablet Take 50 mg by mouth 2 (two) times daily.    Yes Historical Provider, MD  PARoxetine (PAXIL) 40 MG tablet Take 40 mg by mouth at bedtime.    Yes Historical Provider, MD  albuterol (PROVENTIL HFA;VENTOLIN HFA) 108 (90 BASE) MCG/ACT inhaler Inhale 2 puffs into the lungs every 6 (six) hours as needed for wheezing or shortness of breath.     Historical Provider, MD  doxycycline (VIBRAMYCIN) 50 MG capsule Take 2 capsules (100 mg total) by mouth 2 (two) times daily. 01/09/13   Valorie Mcgrory Tamala Ser, PA-C  insulin glargine (LANTUS) 100 UNIT/ML injection Inject 40 Units into the skin daily. Patient takes at [employer]    Historical Provider, MD  methocarbamol (ROBAXIN) 500 MG tablet Take 1 tablet (500 mg total) by mouth every 6 (six) hours as needed. 01/09/13   Lavonna Lampron Tamala Ser, PA-C  oxyCODONE-acetaminophen (PERCOCET/ROXICET) 5-325 MG per tablet Take 1-2 tablets by mouth every 4 (four) hours as needed. 01/09/13   Bryce Cheever Tamala Ser, PA-C    Diet: Diabetic diet Activity:Wear sling at all times Follow-up:in 10 days Disposition - Home Discharged Condition: good   Discharge Orders   Future Orders Complete By Expires     Call MD / Call 911  As directed     Comments:      If you experience chest pain or shortness of breath, CALL 911 and be transported to the hospital emergency room.  If you develope a fever above 101 F, pus (white drainage) or increased drainage or redness at the wound, or calf pain, call your surgeon's office.    Constipation Prevention  As directed     Comments:      Drink plenty of fluids.  Prune juice may be helpful.  You may use a stool softener, such as Colace (over the counter) 100 mg twice a day.  Use MiraLax (over the counter) for constipation as needed.    Diet general  As directed     Discharge instructions  As directed     Comments:      Keep your sling on at all times, including sleeping in your sling. The only time you should remove your sling is to shower only but you need to keep your hand against your chest  while you shower.  Call Dr. Darrelyn Hillock if any wound complications or temperature of 101 degrees F or over.  Call the office for an appointment to see Dr. Darrelyn Hillock in ten days: [phone] and ask for Dr. Jeannetta Ellis nurse, Mackey Birchwood.    Driving restrictions  As directed     Comments:      No driving    Increase activity slowly as tolerated  As directed     Lifting restrictions  As directed     Comments:      No lifting  Medication List         albuterol 108 (90 BASE) MCG/ACT inhaler  Commonly known as:  PROVENTIL HFA;VENTOLIN HFA  Inhale 2 puffs into the lungs every 6 (six) hours as needed for wheezing or shortness of breath.     atorvastatin 10 MG tablet  Commonly known as:  LIPITOR  Take 10 mg by mouth every morning.     doxycycline 50 MG capsule  Commonly known as:  VIBRAMYCIN  Take 2 capsules (100 mg total) by mouth 2 (two) times daily.     insulin aspart 100 UNIT/ML injection  Commonly known as:  novoLOG  - Inject 20-25 Units into the skin 3 (three) times daily before meals. Per sliding scale  - If 250 - patient states will takes 25 units if 300 will take 30 units     insulin glargine 100 UNIT/ML injection  Commonly known as:  LANTUS  Inject 40 Units into the skin daily. Patient takes at nite     levothyroxine 50 MCG tablet  Commonly known as:  SYNTHROID, LEVOTHROID  Take 50 mcg by mouth every morning.     metFORMIN 1000 MG tablet  Commonly known as:  GLUCOPHAGE  Take 1,000 mg by mouth 2 (two) times daily with a meal.     methocarbamol 500 MG tablet  Commonly known as:  ROBAXIN  Take 1 tablet (500 mg total) by mouth every 6 (six) hours as needed.     metoprolol 50 MG tablet  Commonly known as:  LOPRESSOR  Take 50 mg by mouth 2 (two) times daily.     oxyCODONE-acetaminophen 5-325 MG per tablet  Commonly known as:  PERCOCET/ROXICET  Take 1-2 tablets by mouth every 4 (four) hours as needed.     PARoxetine 40 MG tablet  Commonly known as:  PAXIL  Take  40 mg by mouth at bedtime.           Follow-up Information   Follow up with GIOFFRE,RONALD A, MD. Schedule an appointment as soon as possible for a visit in 10 days.   Contact information:   [address] [place] [address] [phone]       Signed: Kerby Nora 01/12/2013, 9:06 AM

## 2013-01-14 ENCOUNTER — Other Ambulatory Visit: Payer: Self-pay | Admitting: *Deleted

## 2013-01-14 ENCOUNTER — Ambulatory Visit: Payer: Medicare Other

## 2013-01-14 DIAGNOSIS — E291 Testicular hypofunction: Secondary | ICD-10-CM

## 2013-01-14 MED ORDER — TESTOSTERONE CYPIONATE 200 MG/ML IM SOLN
300.0000 mg | INTRAMUSCULAR | Status: DC
  Administered 2013-03-04 (×2): 300 mg via INTRAMUSCULAR

## 2013-01-14 MED ORDER — TESTOSTERONE CYPIONATE 200 MG/ML IM SOLN: 300.0000 mg | INTRAMUSCULAR | Status: DC

## 2013-02-02 ENCOUNTER — Other Ambulatory Visit (INDEPENDENT_AMBULATORY_CARE_PROVIDER_SITE_OTHER): Payer: Medicare Other

## 2013-02-02 ENCOUNTER — Other Ambulatory Visit: Payer: Self-pay | Admitting: *Deleted

## 2013-02-02 DIAGNOSIS — E119 Type 2 diabetes mellitus without complications: Secondary | ICD-10-CM

## 2013-02-02 DIAGNOSIS — E785 Hyperlipidemia, unspecified: Secondary | ICD-10-CM

## 2013-02-02 DIAGNOSIS — E291 Testicular hypofunction: Secondary | ICD-10-CM

## 2013-02-02 LAB — COMPREHENSIVE METABOLIC PANEL
ALT: 17 U/L (ref 0–53)
AST: 20 U/L (ref 0–37)
Albumin: 3.4 g/dL — ABNORMAL LOW (ref 3.5–5.2)
Alkaline Phosphatase: 68 U/L (ref 39–117)
BUN: 9 mg/dL (ref 6–23)
Potassium: 4.3 mEq/L (ref 3.5–5.1)

## 2013-02-02 LAB — LIPID PANEL
HDL: 28.2 mg/dL — ABNORMAL LOW (ref >39.00)
Total CHOL/HDL Ratio: 6
Triglycerides: 218 mg/dL — ABNORMAL HIGH (ref 0.0–149.0)

## 2013-02-02 LAB — MICROALBUMIN / CREATININE URINE RATIO
Creatinine,U: 146.5 mg/dL
Microalb Creat Ratio: 4.4 mg/g (ref 0.0–30.0)
Microalb, Ur: 6.4 mg/dL — ABNORMAL HIGH (ref 0.0–1.9)

## 2013-02-02 LAB — URINALYSIS
Ketones, ur: NEGATIVE
Specific Gravity, Urine: 1.02 (ref 1.000–1.030)
Total Protein, Urine: NEGATIVE
Urine Glucose: >=1000
Urobilinogen, UA: 0.2 (ref 0.0–1.0)
pH: 6 (ref 5.0–8.0)

## 2013-02-03 LAB — TESTOSTERONE, FREE, TOTAL, SHBG
Sex Hormone Binding: 25 nmol/L (ref 13–71)
Testosterone: 232 ng/dL — ABNORMAL LOW (ref 300–890)

## 2013-02-04 ENCOUNTER — Encounter (HOSPITAL_COMMUNITY): Payer: Self-pay | Admitting: Anesthesiology

## 2013-02-04 ENCOUNTER — Encounter: Payer: Self-pay | Admitting: Endocrinology

## 2013-02-04 ENCOUNTER — Ambulatory Visit (INDEPENDENT_AMBULATORY_CARE_PROVIDER_SITE_OTHER): Payer: Medicare Other | Admitting: Endocrinology

## 2013-02-04 ENCOUNTER — Other Ambulatory Visit: Payer: Self-pay | Admitting: *Deleted

## 2013-02-04 VITALS — BP 128/82 | HR 92 | Temp 98.7°F | Resp 12 | Ht 74.0 in | Wt 262.0 lb

## 2013-02-04 DIAGNOSIS — E230 Hypopituitarism: Secondary | ICD-10-CM

## 2013-02-04 DIAGNOSIS — E039 Hypothyroidism, unspecified: Secondary | ICD-10-CM

## 2013-02-04 DIAGNOSIS — IMO0002 Reserved for concepts with insufficient information to code with codable children: Secondary | ICD-10-CM | POA: Insufficient documentation

## 2013-02-04 DIAGNOSIS — E1165 Type 2 diabetes mellitus with hyperglycemia: Secondary | ICD-10-CM | POA: Insufficient documentation

## 2013-02-04 DIAGNOSIS — IMO0001 Reserved for inherently not codable concepts without codable children: Secondary | ICD-10-CM

## 2013-02-04 DIAGNOSIS — E785 Hyperlipidemia, unspecified: Secondary | ICD-10-CM

## 2013-02-04 DIAGNOSIS — E236 Other disorders of pituitary gland: Secondary | ICD-10-CM

## 2013-02-04 DIAGNOSIS — I10 Essential (primary) hypertension: Secondary | ICD-10-CM

## 2013-02-04 MED ORDER — DAPAGLIFLOZIN PROPANEDIOL 5 MG PO TABS: 5.0000 mg | ORAL_TABLET | Freq: Every day | ORAL | Status: DC

## 2013-02-04 MED ORDER — PREGABALIN 75 MG PO CAPS: 75.0000 mg | ORAL_CAPSULE | Freq: Two times a day (BID) | ORAL | Status: DC
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
## 2013-02-04 NOTE — Patient Instructions (Addendum)
Lantus 50 to start and adjust 4 units up/down every 3 days; keep am sugar in 100-130 range  Please check blood sugars at least half the time about 2 hours after any meal and as directed on waking up. Please bring blood sugar monitor to each visit

## 2013-02-05 ENCOUNTER — Other Ambulatory Visit: Payer: Self-pay | Admitting: *Deleted

## 2013-02-05 ENCOUNTER — Ambulatory Visit: Payer: Medicare Other

## 2013-02-06 DIAGNOSIS — E230 Hypopituitarism: Secondary | ICD-10-CM | POA: Insufficient documentation

## 2013-02-06 DIAGNOSIS — E039 Hypothyroidism, unspecified: Secondary | ICD-10-CM | POA: Insufficient documentation

## 2013-02-08 ENCOUNTER — Encounter (HOSPITAL_COMMUNITY): Payer: Self-pay | Admitting: Emergency Medicine

## 2013-02-08 ENCOUNTER — Emergency Department (HOSPITAL_COMMUNITY)
Admission: EM | Admit: 2013-02-08 | Discharge: 2013-02-08 | Disposition: A | Discharge status: Home / Self Care | Payer: Medicare Other | Attending: Emergency Medicine | Admitting: Emergency Medicine

## 2013-02-08 DIAGNOSIS — J45909 Unspecified asthma, uncomplicated: Secondary | ICD-10-CM | POA: Insufficient documentation

## 2013-02-08 DIAGNOSIS — L97509 Non-pressure chronic ulcer of other part of unspecified foot with unspecified severity: Secondary | ICD-10-CM | POA: Insufficient documentation

## 2013-02-08 DIAGNOSIS — F3289 Other specified depressive episodes: Secondary | ICD-10-CM | POA: Insufficient documentation

## 2013-02-08 DIAGNOSIS — R059 Cough, unspecified: Secondary | ICD-10-CM | POA: Insufficient documentation

## 2013-02-08 DIAGNOSIS — R599 Enlarged lymph nodes, unspecified: Secondary | ICD-10-CM | POA: Insufficient documentation

## 2013-02-08 DIAGNOSIS — F17200 Nicotine dependence, unspecified, uncomplicated: Secondary | ICD-10-CM | POA: Insufficient documentation

## 2013-02-08 DIAGNOSIS — E039 Hypothyroidism, unspecified: Secondary | ICD-10-CM | POA: Insufficient documentation

## 2013-02-08 DIAGNOSIS — E11621 Type 2 diabetes mellitus with foot ulcer: Secondary | ICD-10-CM

## 2013-02-08 DIAGNOSIS — E1169 Type 2 diabetes mellitus with other specified complication: Secondary | ICD-10-CM | POA: Insufficient documentation

## 2013-02-08 DIAGNOSIS — I10 Essential (primary) hypertension: Secondary | ICD-10-CM | POA: Insufficient documentation

## 2013-02-08 DIAGNOSIS — Z8739 Personal history of other diseases of the musculoskeletal system and connective tissue: Secondary | ICD-10-CM | POA: Insufficient documentation

## 2013-02-08 DIAGNOSIS — R05 Cough: Secondary | ICD-10-CM | POA: Insufficient documentation

## 2013-02-08 DIAGNOSIS — M2540 Effusion, unspecified joint: Secondary | ICD-10-CM | POA: Insufficient documentation

## 2013-02-08 DIAGNOSIS — Z862 Personal history of diseases of the blood and blood-forming organs and certain disorders involving the immune mechanism: Secondary | ICD-10-CM | POA: Insufficient documentation

## 2013-02-08 DIAGNOSIS — IMO0001 Reserved for inherently not codable concepts without codable children: Secondary | ICD-10-CM | POA: Insufficient documentation

## 2013-02-08 DIAGNOSIS — F329 Major depressive disorder, single episode, unspecified: Secondary | ICD-10-CM | POA: Insufficient documentation

## 2013-02-08 DIAGNOSIS — R0602 Shortness of breath: Secondary | ICD-10-CM | POA: Insufficient documentation

## 2013-02-08 DIAGNOSIS — Z79899 Other long term (current) drug therapy: Secondary | ICD-10-CM | POA: Insufficient documentation

## 2013-02-08 DIAGNOSIS — Z8781 Personal history of (healed) traumatic fracture: Secondary | ICD-10-CM | POA: Insufficient documentation

## 2013-02-08 DIAGNOSIS — E785 Hyperlipidemia, unspecified: Secondary | ICD-10-CM | POA: Insufficient documentation

## 2013-02-08 DIAGNOSIS — Z794 Long term (current) use of insulin: Secondary | ICD-10-CM | POA: Insufficient documentation

## 2013-02-08 LAB — GLUCOSE, CAPILLARY

## 2013-02-08 MED ORDER — OXYCODONE-ACETAMINOPHEN 5-325 MG PO TABS
1.0000 | ORAL_TABLET | Freq: Once | ORAL | Status: AC
  Administered 2013-02-08: 1 via ORAL
  Filled 2013-02-08: qty 1

## 2013-02-08 MED ORDER — HYDROCODONE-ACETAMINOPHEN 5-325 MG PO TABS: 2.0000 | ORAL_TABLET | ORAL | Status: DC | PRN

## 2013-02-08 MED ORDER — SULFAMETHOXAZOLE-TRIMETHOPRIM 800-160 MG PO TABS: 1.0000 | ORAL_TABLET | Freq: Two times a day (BID) | ORAL | Status: DC

## 2013-02-08 NOTE — ED Provider Notes (Signed)
CSN: [id number]     Arrival date & time 02/08/13  1833 History     First MD Initiated Contact with Patient 02/08/13 1852     Chief Complaint  Patient presents with  . Foot Pain   (Consider location/radiation/quality/duration/timing/severity/associated sxs/prior Treatment) HPI Comments: Patient has bilateral feet diabetic ulcers. He is able to ambulate without issue. He has not seen a podiatrist. He is not taking antibiotics.  Patient is a 62 y.o. male presenting with lower extremity pain. The history is provided by the patient.  Foot Pain This is a chronic problem. The current episode started more than 1 week ago. The problem occurs constantly. Associated symptoms include shortness of breath. Pertinent negatives include no chest pain, no abdominal pain and no headaches. Nothing aggravates the symptoms. Nothing relieves the symptoms. He has tried acetaminophen for the symptoms. The treatment provided no relief.    Past Medical History  Diagnosis Date  . Lymphocytosis   . Mediastinal lymphadenopathy   . Diabetes mellitus   . Hyperlipidemia   . HTN (hypertension)   . Asthma   . Depression   . Fracture of fibula, distal, right, closed 02/01/2012  . Hypothyroidism   . Arthritis    Past Surgical History  Procedure Laterality Date  . Carpal tunnel release  08-2009    right  . Tonsillectomy    . Eye surgery  2005    both cataracts  . Right leg surgery       plate at ankle   . Shoulder open rotator cuff repair Right 01/08/2013    Procedure: ROTATOR CUFF REPAIR SHOULDER OPEN;  Surgeon: Jacki Cones, MD;  Location: [facility];  Service: Orthopedics;  Laterality: Right;  With Patch Graft   Family History  Problem Relation Age of Onset  . Allergies Sister   . Cancer Mother     stomach  . Pancreatic cancer Father    History  Substance Use Topics  . Smoking status: Current Every Day Smoker -- 0.50 packs/day for 6 years    Types: Cigarettes  . Smokeless tobacco: Never Used  .  Alcohol Use: No     Comment: quit in 2005    Review of Systems  Constitutional: Negative.  Negative for fever, diaphoresis and fatigue.  HENT: Negative.   Respiratory: Positive for cough and shortness of breath. Negative for apnea, choking, chest tightness, wheezing and stridor.        Patient has chronic cough and baseline shortness of breath no worsening symptoms today.  Cardiovascular: Negative for chest pain.  Gastrointestinal: Negative.  Negative for abdominal pain.  Endocrine: Negative.        Insulin-dependent diabetes  Genitourinary: Negative.   Musculoskeletal: Positive for myalgias and joint swelling. Negative for back pain and gait problem.  Neurological: Negative for headaches.    Allergies  Eszopiclone and Neosporin  Home Medications   Current Outpatient Rx  Name  Route  Sig  Dispense  Refill  . albuterol (PROVENTIL HFA;VENTOLIN HFA) 108 (90 BASE) MCG/ACT inhaler   Inhalation   Inhale 2 puffs into the lungs every 6 (six) hours as needed for wheezing or shortness of breath.          Marland Kitchen atorvastatin (LIPITOR) 10 MG tablet   Oral   Take 10 mg by mouth every morning.          . Dapagliflozin Propanediol (FARXIGA) 5 MG TABS   Oral   Take 5 mg by mouth daily.   30 tablet  4   . HYDROcodone-acetaminophen (NORCO/VICODIN) 5-325 MG per tablet   Oral   Take 1 tablet by mouth every 6 (six) hours as needed for pain.         Marland Kitchen insulin aspart (NOVOLOG) 100 UNIT/ML injection   Subcutaneous   Inject 25-30 Units into the skin 3 (three) times daily before meals. Per sliding scale If 250 - patient states will takes 25 units if 300 will take 30 units         . insulin glargine (LANTUS) 100 UNIT/ML injection   Subcutaneous   Inject 40-50 Units into the skin at bedtime. Sliding scale         . levothyroxine (SYNTHROID, LEVOTHROID) 50 MCG tablet   Oral   Take 50 mcg by mouth every morning.          . metFORMIN (GLUCOPHAGE) 1000 MG tablet   Oral   Take 1,000  mg by mouth 2 (two) times daily with a meal.          . metoprolol (LOPRESSOR) 50 MG tablet   Oral   Take 50 mg by mouth 2 (two) times daily.         Marland Kitchen PARoxetine (PAXIL) 40 MG tablet   Oral   Take 40 mg by mouth at bedtime.          Marland Kitchen testosterone cypionate (DEPO-TESTOSTERONE) 200 MG/ML injection   Intramuscular   Inject 1.5 mLs (300 mg total) into the muscle every 14 (fourteen) days.   10 mL   3   . HYDROcodone-acetaminophen (NORCO/VICODIN) 5-325 MG per tablet   Oral   Take 2 tablets by mouth every 4 (four) hours as needed for pain.   6 tablet   0   . sulfamethoxazole-trimethoprim (SEPTRA DS) 800-160 MG per tablet   Oral   Take 1 tablet by mouth every 12 (twelve) hours.   10 tablet   0    BP 148/82  Pulse 74  Temp(Src) 98.3 F (36.8 C) (Oral)  Resp 18  Ht 6\' 2"  (1.88 m)  Wt 250 lb (113.399 kg)  BMI 32.08 kg/m2  SpO2 99% Physical Exam  Constitutional: He is oriented to person, place, and time. He appears well-developed and well-nourished.  HENT:  Head: Normocephalic and atraumatic.  Eyes: Conjunctivae and EOM are normal. Pupils are equal, round, and reactive to light.  Neck: Normal range of motion. Neck supple.  Cardiovascular: Normal rate, regular rhythm, normal heart sounds and intact distal pulses.   Pulmonary/Chest: Effort normal. He has wheezes.  Minimal diffuse wheeze, good air movement, no increased respiratory effort  Abdominal: Soft.  Musculoskeletal:       Feet:  Patient has findings of diabetic foot ulcer on bilateral plantar feet.   Right great toe reveals 4 x 4 centimeter ulcer involving skin and subcutaneous tissue. No weeping, no discharge, no foul smell, distal neurovascular function intact complex range of motion of the toe. Left metatarsal area 3 x 3 at medial ball of feet.  No weeping, no discharge, no foul smell, as well as motor function intact.  Neurological: He is alert and oriented to person, place, and time. He has normal reflexes.     ED Course   Procedures (including critical care time)  Labs Reviewed  GLUCOSE, CAPILLARY - Abnormal; Notable for the following:    Glucose-Capillary 222 (*)    All other components within normal limits   No results found. 1. Diabetic foot ulcer     MDM  62 year old  diabetic male who presents with diabetic foot ulcers. Patient has not seen podiatry nor is he taking antibiotics at this time. States the ulcers have been there for the past 6 months approximately. On examination the ulcers are present however there is no evidence of surrounding cellulitis, deep soft tissue or bony involvement, or edema or discharge or foul smell. Plan the patient patient on oral antibiotics and placed podiatry consult and provide pain medications which she requested. Precautions were given for fever, chills, increasing size, increasing depth, or other concerns. Patient agrees with plan.  Darlys Gales, MD 02/08/13 2043

## 2013-02-08 NOTE — ED Notes (Signed)
Patient presents to ED with complaints of pain and diabetic sores to both feet.

## 2013-02-08 NOTE — ED Notes (Signed)
Pt's CBG is 222 mg/dl. RN Janelle notified.

## 2013-02-24 ENCOUNTER — Telehealth: Payer: Self-pay | Admitting: Endocrinology

## 2013-02-24 MED ORDER — GABAPENTIN 100 MG PO CAPS: 100.0000 mg | ORAL_CAPSULE | Freq: Three times a day (TID) | ORAL | Status: DC

## 2013-02-24 MED ORDER — INSULIN ASPART 100 UNIT/ML ~~LOC~~ SOLN: 25.0000 [IU] | Freq: Three times a day (TID) | SUBCUTANEOUS | Status: DC

## 2013-02-24 NOTE — Telephone Encounter (Signed)
Refill on Gabapentin refill and Novolog samples. Please call [phone] / Sherri S.

## 2013-02-24 NOTE — Telephone Encounter (Signed)
Pt is requesting refill of Gabapentin, okay to fill?  If so, what dose?

## 2013-02-24 NOTE — Telephone Encounter (Signed)
Okay but he was supposed to start on Lyrica instead. Same dose as before if he is not taking Lyrica

## 2013-02-25 ENCOUNTER — Other Ambulatory Visit: Payer: Self-pay | Admitting: *Deleted

## 2013-03-04 ENCOUNTER — Other Ambulatory Visit: Payer: Self-pay | Admitting: *Deleted

## 2013-03-04 ENCOUNTER — Ambulatory Visit: Payer: Medicare Other

## 2013-03-04 ENCOUNTER — Other Ambulatory Visit: Payer: Medicare Other | Admitting: *Deleted

## 2013-03-04 DIAGNOSIS — E230 Hypopituitarism: Secondary | ICD-10-CM

## 2013-03-04 MED ORDER — TESTOSTERONE CYPIONATE 200 MG/ML IM SOLN: INTRAMUSCULAR | Status: DC

## 2013-03-04 MED ORDER — METOPROLOL TARTRATE 50 MG PO TABS: 50.0000 mg | ORAL_TABLET | Freq: Two times a day (BID) | ORAL | Status: DC

## 2013-03-05 ENCOUNTER — Other Ambulatory Visit: Payer: Medicare Other | Admitting: *Deleted

## 2013-03-09 ENCOUNTER — Ambulatory Visit: Payer: Medicare Other | Admitting: Endocrinology

## 2013-03-13 ENCOUNTER — Other Ambulatory Visit: Payer: Medicare Other

## 2013-03-18 ENCOUNTER — Ambulatory Visit: Payer: Medicare Other | Admitting: Endocrinology

## 2013-03-18 DIAGNOSIS — Z0289 Encounter for other administrative examinations: Secondary | ICD-10-CM

## 2013-03-23 ENCOUNTER — Encounter (HOSPITAL_BASED_OUTPATIENT_CLINIC_OR_DEPARTMENT_OTHER): Payer: Medicare Other

## 2013-04-07 ENCOUNTER — Ambulatory Visit: Payer: Medicare Other

## 2013-04-07 ENCOUNTER — Other Ambulatory Visit: Payer: Medicare Other

## 2013-04-08 ENCOUNTER — Ambulatory Visit (INDEPENDENT_AMBULATORY_CARE_PROVIDER_SITE_OTHER): Payer: Medicare Other

## 2013-04-08 ENCOUNTER — Other Ambulatory Visit (INDEPENDENT_AMBULATORY_CARE_PROVIDER_SITE_OTHER): Payer: Medicare Other

## 2013-04-08 DIAGNOSIS — E039 Hypothyroidism, unspecified: Secondary | ICD-10-CM

## 2013-04-08 DIAGNOSIS — E230 Hypopituitarism: Secondary | ICD-10-CM

## 2013-04-08 DIAGNOSIS — E236 Other disorders of pituitary gland: Secondary | ICD-10-CM

## 2013-04-08 LAB — BASIC METABOLIC PANEL
CO2: 24 mEq/L (ref 19–32)
Chloride: 98 mEq/L (ref 96–112)
Sodium: 136 mEq/L (ref 135–145)

## 2013-04-08 LAB — T4, FREE: Free T4: 1 ng/dL (ref 0.60–1.60)

## 2013-04-08 LAB — TSH: TSH: 1.44 u[IU]/mL (ref 0.35–5.50)

## 2013-04-08 MED ORDER — TESTOSTERONE CYPIONATE 200 MG/ML IM SOLN
150.0000 mg | INTRAMUSCULAR | Status: DC
  Administered 2013-04-08: 150 mg via INTRAMUSCULAR

## 2013-04-10 ENCOUNTER — Other Ambulatory Visit: Payer: Self-pay | Admitting: *Deleted

## 2013-04-10 ENCOUNTER — Encounter: Payer: Self-pay | Admitting: Endocrinology

## 2013-04-10 ENCOUNTER — Ambulatory Visit (INDEPENDENT_AMBULATORY_CARE_PROVIDER_SITE_OTHER): Payer: Medicare Other | Admitting: Endocrinology

## 2013-04-10 VITALS — BP 126/80 | HR 74 | Temp 98.6°F | Resp 12 | Ht 70.5 in | Wt 247.0 lb

## 2013-04-10 DIAGNOSIS — IMO0001 Reserved for inherently not codable concepts without codable children: Secondary | ICD-10-CM

## 2013-04-10 DIAGNOSIS — I10 Essential (primary) hypertension: Secondary | ICD-10-CM

## 2013-04-10 DIAGNOSIS — L0291 Cutaneous abscess, unspecified: Secondary | ICD-10-CM

## 2013-04-10 DIAGNOSIS — E236 Other disorders of pituitary gland: Secondary | ICD-10-CM

## 2013-04-10 DIAGNOSIS — E230 Hypopituitarism: Secondary | ICD-10-CM

## 2013-04-10 DIAGNOSIS — L0390 Cellulitis, unspecified: Secondary | ICD-10-CM

## 2013-04-10 DIAGNOSIS — E039 Hypothyroidism, unspecified: Secondary | ICD-10-CM

## 2013-04-10 MED ORDER — GLUCOSE BLOOD VI STRP: ORAL_STRIP | Status: DC

## 2013-04-10 MED ORDER — AMOXICILLIN-POT CLAVULANATE 875-125 MG PO TABS: 1.0000 | ORAL_TABLET | Freq: Two times a day (BID) | ORAL | Status: DC

## 2013-04-10 NOTE — Patient Instructions (Signed)
Please check blood sugars at least half the time about 2 hours after any meal and as directed on waking up. Please bring blood sugar monitor to each visit  Augementin for 2 weeks  Restart Insulin as before
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
## 2013-04-27 ENCOUNTER — Emergency Department (HOSPITAL_COMMUNITY)
Admission: EM | Admit: 2013-04-27 | Discharge: 2013-04-27 | Discharge status: Against Medical Advice | Payer: Medicare Other | Attending: Emergency Medicine | Admitting: Emergency Medicine

## 2013-04-27 ENCOUNTER — Emergency Department (HOSPITAL_COMMUNITY): Payer: Medicare Other

## 2013-04-27 ENCOUNTER — Encounter (HOSPITAL_COMMUNITY): Payer: Self-pay | Admitting: Emergency Medicine

## 2013-04-27 DIAGNOSIS — J45909 Unspecified asthma, uncomplicated: Secondary | ICD-10-CM | POA: Insufficient documentation

## 2013-04-27 DIAGNOSIS — F329 Major depressive disorder, single episode, unspecified: Secondary | ICD-10-CM | POA: Insufficient documentation

## 2013-04-27 DIAGNOSIS — L97509 Non-pressure chronic ulcer of other part of unspecified foot with unspecified severity: Secondary | ICD-10-CM | POA: Insufficient documentation

## 2013-04-27 DIAGNOSIS — Z9889 Other specified postprocedural states: Secondary | ICD-10-CM | POA: Insufficient documentation

## 2013-04-27 DIAGNOSIS — F3289 Other specified depressive episodes: Secondary | ICD-10-CM | POA: Insufficient documentation

## 2013-04-27 DIAGNOSIS — I10 Essential (primary) hypertension: Secondary | ICD-10-CM | POA: Insufficient documentation

## 2013-04-27 DIAGNOSIS — Z862 Personal history of diseases of the blood and blood-forming organs and certain disorders involving the immune mechanism: Secondary | ICD-10-CM | POA: Insufficient documentation

## 2013-04-27 DIAGNOSIS — E1169 Type 2 diabetes mellitus with other specified complication: Secondary | ICD-10-CM | POA: Insufficient documentation

## 2013-04-27 DIAGNOSIS — Z8781 Personal history of (healed) traumatic fracture: Secondary | ICD-10-CM | POA: Insufficient documentation

## 2013-04-27 DIAGNOSIS — E039 Hypothyroidism, unspecified: Secondary | ICD-10-CM | POA: Insufficient documentation

## 2013-04-27 DIAGNOSIS — E11621 Type 2 diabetes mellitus with foot ulcer: Secondary | ICD-10-CM

## 2013-04-27 DIAGNOSIS — E785 Hyperlipidemia, unspecified: Secondary | ICD-10-CM | POA: Insufficient documentation

## 2013-04-27 DIAGNOSIS — Z8739 Personal history of other diseases of the musculoskeletal system and connective tissue: Secondary | ICD-10-CM | POA: Insufficient documentation

## 2013-04-27 DIAGNOSIS — Z794 Long term (current) use of insulin: Secondary | ICD-10-CM | POA: Insufficient documentation

## 2013-04-27 DIAGNOSIS — F17200 Nicotine dependence, unspecified, uncomplicated: Secondary | ICD-10-CM | POA: Insufficient documentation

## 2013-04-27 DIAGNOSIS — Z79899 Other long term (current) drug therapy: Secondary | ICD-10-CM | POA: Insufficient documentation

## 2013-04-27 LAB — GLUCOSE, CAPILLARY: Glucose-Capillary: 215 mg/dL — ABNORMAL HIGH (ref 70–99)

## 2013-04-27 LAB — CBC WITH DIFFERENTIAL/PLATELET
Basophils Absolute: 0 10*3/uL (ref 0.0–0.1)
Basophils Relative: 0 % (ref 0–1)
Eosinophils Relative: 3 % (ref 0–5)
Hemoglobin: 18.4 g/dL — ABNORMAL HIGH (ref 13.0–17.0)
MCH: 29.8 pg (ref 26.0–34.0)
MCHC: 34.4 g/dL (ref 30.0–36.0)
MCV: 86.7 fL (ref 78.0–100.0)
Monocytes Relative: 8 % (ref 3–12)
Neutro Abs: 7 10*3/uL (ref 1.7–7.7)
Neutrophils Relative %: 67 % (ref 43–77)
Platelets: 192 10*3/uL (ref 150–400)
RDW: 14.9 % (ref 11.5–15.5)

## 2013-04-27 LAB — BASIC METABOLIC PANEL
Calcium: 9.7 mg/dL (ref 8.4–10.5)
Chloride: 94 mEq/L — ABNORMAL LOW (ref 96–112)
GFR calc Af Amer: >90 mL/min (ref >90)
GFR calc non Af Amer: >90 mL/min (ref >90)
Sodium: 132 mEq/L — ABNORMAL LOW (ref 135–145)

## 2013-04-27 MED ORDER — OXYCODONE-ACETAMINOPHEN 5-325 MG PO TABS
1.0000 | ORAL_TABLET | Freq: Once | ORAL | Status: AC
  Administered 2013-04-27: 1 via ORAL
  Filled 2013-04-27: qty 1

## 2013-04-27 NOTE — ED Notes (Addendum)
Dr. Preston Fleeting notified of patient leaving AMA. Pt explained risks of leaving AMA and states he understands.

## 2013-04-27 NOTE — ED Notes (Signed)
Pt states he has to leave because his ride is here and has no other way home. Pt requesting to come back tomorrow to have imaging done.

## 2013-04-27 NOTE — ED Notes (Signed)
Pt c/o bilateral foot pain with ulcerations to bilateral feet with pain; pt sts CBG has been higher than norm over last several days

## 2013-04-28 LAB — C-REACTIVE PROTEIN: CRP: <0.5 mg/dL — ABNORMAL LOW (ref <0.60)

## 2013-04-28 LAB — SEDIMENTATION RATE: Sed Rate: 2 mm/hr (ref 0–16)

## 2013-04-28 NOTE — ED Provider Notes (Signed)
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 He is noted to have a draining ulcer of the toe. There is concern about possible development of osteomyelitis and MRI was ordered but the patient left AMA before MRI could be obtained.  I saw and evaluated the patient, reviewed the resident's note and I agree with the findings and plan.  EKG Interpretation   None         Dione Booze, MD 04/28/13 [phone]

## 2013-04-28 NOTE — ED Provider Notes (Signed)
CSN: [id number]     Arrival date & time 04/27/13  1438 History   First MD Initiated Contact with Patient 04/27/13 1839     Chief Complaint  Patient presents with  . Foot Pain    The history is provided by the patient. No language interpreter was used.   Derek Calderon is a 62 y.o. Caucasian male with past medical history diabetes with diabetic foot ulcer who comes emergency department today with pain to his right great toe. Derek Calderon is had the diabetic foot ulcer for approximately 8 months he is being treated by a wound specialist for this. He states over the past day his pain has become worse to the great toe and he has developed worsening erythema is result he comes emergency department for evaluation. He rates the pain at a 10 currently. He is using ibuprofen at home for the pain. Denies any fevers or chills. He has no other complaints.   Past Medical History  Diagnosis Date  . Lymphocytosis   . Mediastinal lymphadenopathy   . Diabetes mellitus   . Hyperlipidemia   . HTN (hypertension)   . Asthma   . Depression   . Fracture of fibula, distal, right, closed 02/01/2012  . Hypothyroidism   . Arthritis    Past Surgical History  Procedure Laterality Date  . Carpal tunnel release  08-2009    right  . Tonsillectomy    . Eye surgery  2005    both cataracts  . Right leg surgery       plate at ankle   . Shoulder open rotator cuff repair Right 01/08/2013    Procedure: ROTATOR CUFF REPAIR SHOULDER OPEN;  Surgeon: Jacki Cones, MD;  Location: [facility];  Service: Orthopedics;  Laterality: Right;  With Patch Graft   Family History  Problem Relation Age of Onset  . Allergies Sister   . Cancer Mother     stomach  . Pancreatic cancer Father    History  Substance Use Topics  . Smoking status: Current Every Day Smoker -- 0.50 packs/day for 6 years    Types: Cigarettes  . Smokeless tobacco: Never Used  . Alcohol Use: No     Comment: quit in 2005    Review of Systems  Constitutional:  Negative for fever and chills.  Respiratory: Negative for cough and shortness of breath.   Gastrointestinal: Negative for nausea, vomiting and diarrhea.  Genitourinary: Negative for dysuria, urgency and frequency.  Skin: Positive for color change (erythema to the right great toe) and wound (diabetic ulcer to the right great toe).  All other systems reviewed and are negative.    Allergies  Eszopiclone and Neosporin  Home Medications   Current Outpatient Rx  Name  Route  Sig  Dispense  Refill  . albuterol (PROVENTIL HFA;VENTOLIN HFA) 108 (90 BASE) MCG/ACT inhaler   Inhalation   Inhale 2 puffs into the lungs every 6 (six) hours as needed for wheezing or shortness of breath.          Marland Kitchen atorvastatin (LIPITOR) 10 MG tablet   Oral   Take 10 mg by mouth every morning.          . Dapagliflozin Propanediol (FARXIGA) 5 MG TABS   Oral   Take 5 mg by mouth daily.   30 tablet   4   . gabapentin (NEURONTIN) 100 MG capsule   Oral   Take 1 capsule (100 mg total) by mouth 3 (three) times daily.   90  capsule   3   . glucose blood (ONETOUCH VERIO) test strip      Use as instructed to check blood sugars 3 times per day   100 each   5     Dx code 250.00   . HYDROcodone-acetaminophen (NORCO) 10-325 MG per tablet   Oral   Take 1 tablet by mouth every 6 (six) hours as needed for pain.         Marland Kitchen insulin aspart (NOVOLOG) 100 UNIT/ML injection   Subcutaneous   Inject 25-30 Units into the skin 3 (three) times daily before meals. Per sliding scale If 250 - patient states will takes 25 units if 300 will take 30 units   1 vial   3   . insulin glargine (LANTUS) 100 UNIT/ML injection   Subcutaneous   Inject 40-50 Units into the skin at bedtime. Sliding scale         . levothyroxine (SYNTHROID, LEVOTHROID) 50 MCG tablet   Oral   Take 50 mcg by mouth every morning.          . metFORMIN (GLUCOPHAGE) 1000 MG tablet   Oral   Take 1,000 mg by mouth 2 (two) times daily with a meal.           . metoprolol (LOPRESSOR) 50 MG tablet   Oral   Take 1 tablet (50 mg total) by mouth 2 (two) times daily.   60 tablet   5   . PARoxetine (PAXIL) 40 MG tablet   Oral   Take 40 mg by mouth at bedtime.          Marland Kitchen testosterone cypionate (DEPO-TESTOSTERONE) 200 MG/ML injection   Intramuscular   Inject 1.5 mLs (300 mg total) into the muscle every 14 (fourteen) days.   10 mL   3    BP 133/88  Pulse 78  Temp(Src) 98.1 F (36.7 C) (Oral)  Resp 18  Ht 6\' 2"  (1.88 m)  Wt 249 lb 4.8 oz (113.082 kg)  BMI 31.99 kg/m2  SpO2 96% Physical Exam  Nursing note and vitals reviewed. Constitutional: He is oriented to person, place, and time. He appears well-developed and well-nourished. No distress.  HENT:  Head: Normocephalic and atraumatic.  Eyes: Pupils are equal, round, and reactive to light.  Neck: Normal range of motion.  Cardiovascular: Normal rate, regular rhythm and normal heart sounds.   Pulmonary/Chest: Effort normal and breath sounds normal. No respiratory distress. He has no decreased breath sounds. He has no wheezes. He has no rhonchi. He has no rales.  Abdominal: Soft. He exhibits no distension. There is no tenderness. There is no rebound and no guarding.  Musculoskeletal: He exhibits no edema and no tenderness.  Neurological: He is alert and oriented to person, place, and time. He exhibits normal muscle tone.  Skin: Skin is warm and dry.  Diabetic ulcer to the posterior aspect of the right great toe approximately 1 cm in diameter.  Fibrinous base to the wound.  Erythema to the interphalangeal joint.      ED Course  Procedures (including critical care time) Labs Review Labs Reviewed  GLUCOSE, CAPILLARY - Abnormal; Notable for the following:    Glucose-Capillary 215 (*)    All other components within normal limits  CBC WITH DIFFERENTIAL - Abnormal; Notable for the following:    RBC 6.17 (*)    Hemoglobin 18.4 (*)    HCT 53.5 (*)    All other components within  normal limits  C-REACTIVE PROTEIN -  Abnormal; Notable for the following:    CRP <0.5 (*)    All other components within normal limits  BASIC METABOLIC PANEL - Abnormal; Notable for the following:    Sodium 132 (*)    Chloride 94 (*)    Glucose, Bld 142 (*)    All other components within normal limits  SEDIMENTATION RATE   Imaging Review No results found.  EKG Interpretation   None       MDM  Derek Calderon is a 62 year old Caucasian male past medical history of diabetic foot ulcer comes to the emergency department today with pain to the ulcer. Physical exam as above. Ms. expressed pain was treated with a dose of Percocet in emergency department. With worsening erythema and pain he was felt to require an MRI to evaluate for osteomyelitis. The rest of the workup included a CBC, ESR, CMP, BMP, and a glucose. While awaiting his MRI Derek Calderon decided that he wanted to leave. Stated that he would return to emergency department tomorrow to obtain the MRI. Derek Calderon was unable to be convinced to stay in the emergency department. The left left the hospital AGAINST MEDICAL ADVICE. He refused to talk to a physician before leaving. Care was discussed with my attending Dr. Preston Fleeting.    1. Diabetic ulcer of right great toe        Bethann Berkshire, MD 04/28/13 [phone]

## 2013-05-05 ENCOUNTER — Ambulatory Visit (INDEPENDENT_AMBULATORY_CARE_PROVIDER_SITE_OTHER): Payer: Medicare Other | Admitting: *Deleted

## 2013-05-05 DIAGNOSIS — E230 Hypopituitarism: Secondary | ICD-10-CM

## 2013-05-05 DIAGNOSIS — E236 Other disorders of pituitary gland: Secondary | ICD-10-CM

## 2013-05-05 MED ORDER — TESTOSTERONE CYPIONATE 200 MG/ML IM SOLN: 300.0000 mg | Freq: Once | INTRAMUSCULAR | Status: DC

## 2013-05-11 ENCOUNTER — Ambulatory Visit: Payer: Medicare Other | Admitting: Endocrinology

## 2013-05-20 ENCOUNTER — Other Ambulatory Visit: Payer: Medicare Other

## 2013-05-20 ENCOUNTER — Ambulatory Visit: Payer: Medicare Other | Admitting: Endocrinology

## 2013-05-25 ENCOUNTER — Encounter: Payer: Self-pay | Admitting: Endocrinology

## 2013-05-25 ENCOUNTER — Ambulatory Visit (INDEPENDENT_AMBULATORY_CARE_PROVIDER_SITE_OTHER): Payer: Medicare Other | Admitting: Endocrinology

## 2013-05-25 ENCOUNTER — Other Ambulatory Visit (INDEPENDENT_AMBULATORY_CARE_PROVIDER_SITE_OTHER): Payer: Medicare Other

## 2013-05-25 VITALS — BP 112/70 | HR 76 | Temp 98.7°F | Resp 12 | Ht 70.0 in | Wt 245.9 lb

## 2013-05-25 DIAGNOSIS — E236 Other disorders of pituitary gland: Secondary | ICD-10-CM

## 2013-05-25 DIAGNOSIS — E230 Hypopituitarism: Secondary | ICD-10-CM

## 2013-05-25 DIAGNOSIS — IMO0001 Reserved for inherently not codable concepts without codable children: Secondary | ICD-10-CM

## 2013-05-25 DIAGNOSIS — D751 Secondary polycythemia: Secondary | ICD-10-CM

## 2013-05-25 LAB — COMPREHENSIVE METABOLIC PANEL
ALT: 20 U/L (ref 0–53)
Albumin: 3.8 g/dL (ref 3.5–5.2)
BUN: 11 mg/dL (ref 6–23)
CO2: 28 mEq/L (ref 19–32)
Calcium: 9.4 mg/dL (ref 8.4–10.5)
Chloride: 98 mEq/L (ref 96–112)
Creatinine, Ser: 0.8 mg/dL (ref 0.4–1.5)
GFR: 109.53 mL/min (ref >60.00)
Glucose, Bld: 309 mg/dL — ABNORMAL HIGH (ref 70–99)
Potassium: 4.3 mEq/L (ref 3.5–5.1)
Sodium: 135 mEq/L (ref 135–145)
Total Bilirubin: 0.4 mg/dL (ref 0.3–1.2)

## 2013-05-25 LAB — LIPID PANEL
Cholesterol: 155 mg/dL (ref 0–200)
Total CHOL/HDL Ratio: 6

## 2013-05-25 LAB — LDL CHOLESTEROL, DIRECT: Direct LDL: 85.7 mg/dL

## 2013-05-25 LAB — HEMOGLOBIN A1C: Hgb A1c MFr Bld: 10.6 % — ABNORMAL HIGH (ref 4.6–6.5)
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
## 2013-05-25 NOTE — Patient Instructions (Signed)
Please check blood sugars at least half the time about 2 hours after any meal and as directed on waking up. Please bring blood sugar monitor to each visit  LANTUS 50 UNITS DAILY, AM SUGAR [facility] WITH THIS 80-140  NOVOLOG 25 AT LUNCH AND 30 AT SUPPER, AFTER MEAL [facility] < 180

## 2013-06-04 MED ORDER — TESTOSTERONE CYPIONATE 200 MG/ML IM SOLN
300.0000 mg | Freq: Once | INTRAMUSCULAR | Status: AC
  Administered 2013-05-05: 300 mg via INTRAMUSCULAR

## 2013-06-04 NOTE — Progress Notes (Signed)
Patient supplied med

## 2013-06-24 ENCOUNTER — Ambulatory Visit: Payer: Medicare Other

## 2013-06-24 ENCOUNTER — Other Ambulatory Visit: Payer: Medicare Other | Admitting: *Deleted

## 2013-06-24 DIAGNOSIS — E291 Testicular hypofunction: Secondary | ICD-10-CM

## 2013-06-24 MED ORDER — TESTOSTERONE CYPIONATE 200 MG/ML IM SOLN
300.0000 mg | Freq: Once | INTRAMUSCULAR | Status: AC
  Administered 2013-06-24: 300 mg via INTRAMUSCULAR

## 2013-06-28 ENCOUNTER — Encounter (HOSPITAL_COMMUNITY): Payer: Self-pay | Admitting: Emergency Medicine

## 2013-06-28 ENCOUNTER — Emergency Department (HOSPITAL_COMMUNITY)
Admission: EM | Admit: 2013-06-28 | Discharge: 2013-06-28 | Disposition: A | Discharge status: Home / Self Care | Payer: Medicare Other | Attending: Emergency Medicine | Admitting: Emergency Medicine

## 2013-06-28 DIAGNOSIS — L97509 Non-pressure chronic ulcer of other part of unspecified foot with unspecified severity: Secondary | ICD-10-CM | POA: Insufficient documentation

## 2013-06-28 DIAGNOSIS — IMO0001 Reserved for inherently not codable concepts without codable children: Secondary | ICD-10-CM | POA: Insufficient documentation

## 2013-06-28 DIAGNOSIS — F17200 Nicotine dependence, unspecified, uncomplicated: Secondary | ICD-10-CM | POA: Insufficient documentation

## 2013-06-28 DIAGNOSIS — F329 Major depressive disorder, single episode, unspecified: Secondary | ICD-10-CM | POA: Insufficient documentation

## 2013-06-28 DIAGNOSIS — E039 Hypothyroidism, unspecified: Secondary | ICD-10-CM | POA: Insufficient documentation

## 2013-06-28 DIAGNOSIS — Z888 Allergy status to other drugs, medicaments and biological substances status: Secondary | ICD-10-CM | POA: Insufficient documentation

## 2013-06-28 DIAGNOSIS — L02419 Cutaneous abscess of limb, unspecified: Secondary | ICD-10-CM | POA: Insufficient documentation

## 2013-06-28 DIAGNOSIS — L0291 Cutaneous abscess, unspecified: Secondary | ICD-10-CM

## 2013-06-28 DIAGNOSIS — I10 Essential (primary) hypertension: Secondary | ICD-10-CM | POA: Insufficient documentation

## 2013-06-28 DIAGNOSIS — Z79899 Other long term (current) drug therapy: Secondary | ICD-10-CM | POA: Insufficient documentation

## 2013-06-28 DIAGNOSIS — Z8739 Personal history of other diseases of the musculoskeletal system and connective tissue: Secondary | ICD-10-CM | POA: Insufficient documentation

## 2013-06-28 DIAGNOSIS — F3289 Other specified depressive episodes: Secondary | ICD-10-CM | POA: Insufficient documentation

## 2013-06-28 DIAGNOSIS — M129 Arthropathy, unspecified: Secondary | ICD-10-CM | POA: Insufficient documentation

## 2013-06-28 DIAGNOSIS — E785 Hyperlipidemia, unspecified: Secondary | ICD-10-CM | POA: Insufficient documentation

## 2013-06-28 DIAGNOSIS — E1169 Type 2 diabetes mellitus with other specified complication: Secondary | ICD-10-CM | POA: Insufficient documentation

## 2013-06-28 DIAGNOSIS — J45909 Unspecified asthma, uncomplicated: Secondary | ICD-10-CM | POA: Insufficient documentation

## 2013-06-28 DIAGNOSIS — Z794 Long term (current) use of insulin: Secondary | ICD-10-CM | POA: Insufficient documentation

## 2013-06-28 MED ORDER — HYDROCODONE-ACETAMINOPHEN 5-325 MG PO TABS
1.0000 | ORAL_TABLET | Freq: Once | ORAL | Status: AC
  Administered 2013-06-28: 1 via ORAL
  Filled 2013-06-28: qty 1

## 2013-06-28 MED ORDER — HYDROCODONE-ACETAMINOPHEN 5-325 MG PO TABS: 2.0000 | ORAL_TABLET | ORAL | Status: DC | PRN

## 2013-06-28 NOTE — ED Notes (Signed)
PA at bedside.

## 2013-06-28 NOTE — ED Provider Notes (Signed)
CSN: [id number]     Arrival date & time 06/28/13  1040 History  This chart was scribed for non-physician practitioner, Marlon Pel, PA-C working with Geoffery Lyons, MD by Greggory Stallion, ED scribe. This patient was seen in room TR11C/TR11C and the patient's care was started at 11:05 AM.   Chief Complaint  Patient presents with  . Abscess   The history is provided by the patient. No language interpreter was used.   HPI Comments: Derek Calderon is a 63 y.o. male with history of diabetes who presents to the Emergency Department complaining of an abscess to his left hip that he noticed about 2 weeks ago. States he has associated worsening pain around the abscess. He states it has doubled in size over the last 2 days. Denies any drainage. Pt has a diabetic ulcer on his toe and is currently taking Cipro. Denies fever, emesis, lethargy. He has had abscesses in the past and had them drained.   Past Medical History  Diagnosis Date  . Lymphocytosis   . Mediastinal lymphadenopathy   . Diabetes mellitus   . Hyperlipidemia   . HTN (hypertension)   . Asthma   . Depression   . Fracture of fibula, distal, right, closed 02/01/2012  . Hypothyroidism   . Arthritis    Past Surgical History  Procedure Laterality Date  . Carpal tunnel release  08-2009    right  . Tonsillectomy    . Eye surgery  2005    both cataracts  . Right leg surgery       plate at ankle   . Shoulder open rotator cuff repair Right 01/08/2013    Procedure: ROTATOR CUFF REPAIR SHOULDER OPEN;  Surgeon: Jacki Cones, MD;  Location: [facility];  Service: Orthopedics;  Laterality: Right;  With Patch Graft   Family History  Problem Relation Age of Onset  . Allergies Sister   . Cancer Mother     stomach  . Pancreatic cancer Father    History  Substance Use Topics  . Smoking status: Current Every Day Smoker -- 0.50 packs/day for 6 years    Types: Cigarettes  . Smokeless tobacco: Never Used  . Alcohol Use: No     Comment: quit  in 2005    Review of Systems  Constitutional: Negative for fever.  Gastrointestinal: Negative for vomiting.  Musculoskeletal: Positive for myalgias.  Skin: Positive for wound (abscess).  All other systems reviewed and are negative.    Allergies  Eszopiclone and Neosporin  Home Medications   Current Outpatient Rx  Name  Route  Sig  Dispense  Refill  . albuterol (PROVENTIL HFA;VENTOLIN HFA) 108 (90 BASE) MCG/ACT inhaler   Inhalation   Inhale 2 puffs into the lungs every 6 (six) hours as needed for wheezing or shortness of breath.          Marland Kitchen atorvastatin (LIPITOR) 10 MG tablet   Oral   Take 10 mg by mouth every morning.          . Dapagliflozin Propanediol (FARXIGA) 5 MG TABS   Oral   Take 5 mg by mouth daily.   30 tablet   4   . gabapentin (NEURONTIN) 100 MG capsule   Oral   Take 1 capsule (100 mg total) by mouth 3 (three) times daily.   90 capsule   3   . glucose blood (ONETOUCH VERIO) test strip      Use as instructed to check blood sugars 3 times per day  100 each   5     Dx code 250.00   . HYDROcodone-acetaminophen (NORCO) 10-325 MG per tablet   Oral   Take 1 tablet by mouth every 6 (six) hours as needed for pain.         Marland Kitchen HYDROcodone-acetaminophen (NORCO/VICODIN) 5-325 MG per tablet   Oral   Take 2 tablets by mouth every 4 (four) hours as needed.   6 tablet   0   . insulin aspart (NOVOLOG) 100 UNIT/ML injection   Subcutaneous   Inject 25-30 Units into the skin 3 (three) times daily before meals. Per sliding scale If 250 - patient states will takes 25 units if 300 will take 30 units   1 vial   3   . insulin glargine (LANTUS) 100 UNIT/ML injection   Subcutaneous   Inject 40-50 Units into the skin at bedtime. Sliding scale         . levothyroxine (SYNTHROID, LEVOTHROID) 50 MCG tablet   Oral   Take 50 mcg by mouth every morning.          . metFORMIN (GLUCOPHAGE) 1000 MG tablet   Oral   Take 1,000 mg by mouth 2 (two) times daily with  a meal.          . metoprolol (LOPRESSOR) 50 MG tablet   Oral   Take 1 tablet (50 mg total) by mouth 2 (two) times daily.   60 tablet   5   . oxyCODONE-acetaminophen (PERCOCET) 10-325 MG per tablet               . PARoxetine (PAXIL) 40 MG tablet   Oral   Take 40 mg by mouth at bedtime.          Marland Kitchen testosterone cypionate (DEPO-TESTOSTERONE) 200 MG/ML injection   Intramuscular   Inject 1.5 mLs (300 mg total) into the muscle every 14 (fourteen) days.   10 mL   3    BP 118/91  Pulse 108  Temp(Src) 98.1 F (36.7 C) (Oral)  Resp 19  Wt 240 lb (108.863 kg)  SpO2 95%  Physical Exam  Nursing note and vitals reviewed. Constitutional: He is oriented to person, place, and time. He appears well-developed and well-nourished. No distress.  HENT:  Head: Normocephalic and atraumatic.  Eyes: EOM are normal.  Neck: Neck supple. No tracheal deviation present.  Cardiovascular: Normal rate.   Pulmonary/Chest: Effort normal. No respiratory distress.  Musculoskeletal: Normal range of motion.  Neurological: He is alert and oriented to person, place, and time.  Skin: Skin is warm and dry.  1.5 cm abscess to left hip. Mild cellulitis. Tender.   Psychiatric: He has a normal mood and affect. His behavior is normal.    ED Course  Procedures (including critical care time)  DIAGNOSTIC STUDIES: Oxygen Saturation is 95% on RA, adequate by my interpretation.    COORDINATION OF CARE: 11:10 AM-Discussed treatment plan which includes [place] and I&D with pt at bedside and pt agreed to plan.   INCISION AND DRAINAGE Performed by: Marlon Pel, PA-C Consent: Verbal consent obtained. Risks and benefits: risks, benefits and alternatives were discussed Type: abscess  Body area: left hip  Anesthesia: local infiltration  Incision was made with an 18 gauge needle.  Local anesthetic: lidocaine 2% with epinephrine  Anesthetic total: 1 ml  Complexity: complex Blunt dissection to break up  loculations  Drainage: purulent  Drainage amount: 1 mL  Packing material: 1/4 in iodoform gauze  Patient tolerance: Patient tolerated the  procedure well with no immediate complications.    Labs Review Labs Reviewed - No data to display Imaging Review No results found.  EKG Interpretation   None       MDM   1. Abscess     Pt already on abx. Abscess drained he is to f/u with wound clinic  62 y.o.Mead [place]'s evaluation in the Emergency Department is complete. It has been determined that no acute conditions requiring further emergency intervention are present at this time. The patient/guardian have been advised of the diagnosis and plan. We have discussed signs and symptoms that warrant return to the ED, such as changes or worsening in symptoms.  Vital signs are stable at discharge. Filed Vitals:   06/28/13 1128  BP: 145/98  Pulse: 105  Temp:   Resp: 20    Patient/guardian has voiced understanding and agreed to follow-up with the PCP or specialist.  I personally performed the services described in this documentation, which was scribed in my presence. The recorded information has been reviewed and is accurate.   Dorthula Matas, PA-C 06/28/13 1317

## 2013-06-28 NOTE — ED Provider Notes (Signed)
Medical screening examination/treatment/procedure(s) were performed by non-physician practitioner and as supervising physician I was immediately available for consultation/collaboration.     Geoffery Lyons, MD 06/28/13 [phone]

## 2013-06-28 NOTE — ED Notes (Signed)
Reports having abscess on left buttock. No acute distress noted at triage.

## 2013-07-08 ENCOUNTER — Ambulatory Visit: Payer: Medicare Other

## 2013-07-22 ENCOUNTER — Encounter (HOSPITAL_COMMUNITY): Payer: Self-pay | Admitting: Emergency Medicine

## 2013-07-22 ENCOUNTER — Emergency Department (HOSPITAL_COMMUNITY)
Admission: EM | Admit: 2013-07-22 | Discharge: 2013-07-23 | Disposition: A | Discharge status: Home / Self Care | Payer: Medicare Other | Attending: Emergency Medicine | Admitting: Emergency Medicine

## 2013-07-22 DIAGNOSIS — J45909 Unspecified asthma, uncomplicated: Secondary | ICD-10-CM | POA: Insufficient documentation

## 2013-07-22 DIAGNOSIS — E785 Hyperlipidemia, unspecified: Secondary | ICD-10-CM | POA: Insufficient documentation

## 2013-07-22 DIAGNOSIS — E039 Hypothyroidism, unspecified: Secondary | ICD-10-CM | POA: Insufficient documentation

## 2013-07-22 DIAGNOSIS — F329 Major depressive disorder, single episode, unspecified: Secondary | ICD-10-CM | POA: Insufficient documentation

## 2013-07-22 DIAGNOSIS — I10 Essential (primary) hypertension: Secondary | ICD-10-CM | POA: Insufficient documentation

## 2013-07-22 DIAGNOSIS — L02419 Cutaneous abscess of limb, unspecified: Secondary | ICD-10-CM | POA: Insufficient documentation

## 2013-07-22 DIAGNOSIS — L02416 Cutaneous abscess of left lower limb: Secondary | ICD-10-CM

## 2013-07-22 DIAGNOSIS — F17200 Nicotine dependence, unspecified, uncomplicated: Secondary | ICD-10-CM | POA: Insufficient documentation

## 2013-07-22 DIAGNOSIS — Z8781 Personal history of (healed) traumatic fracture: Secondary | ICD-10-CM | POA: Insufficient documentation

## 2013-07-22 DIAGNOSIS — L03119 Cellulitis of unspecified part of limb: Principal | ICD-10-CM

## 2013-07-22 DIAGNOSIS — M129 Arthropathy, unspecified: Secondary | ICD-10-CM | POA: Insufficient documentation

## 2013-07-22 DIAGNOSIS — Z794 Long term (current) use of insulin: Secondary | ICD-10-CM | POA: Insufficient documentation

## 2013-07-22 DIAGNOSIS — F3289 Other specified depressive episodes: Secondary | ICD-10-CM | POA: Insufficient documentation

## 2013-07-22 DIAGNOSIS — E119 Type 2 diabetes mellitus without complications: Secondary | ICD-10-CM | POA: Insufficient documentation

## 2013-07-22 DIAGNOSIS — Z79899 Other long term (current) drug therapy: Secondary | ICD-10-CM | POA: Insufficient documentation

## 2013-07-22 MED ORDER — HYDROCODONE-ACETAMINOPHEN 5-325 MG PO TABS
1.0000 | ORAL_TABLET | Freq: Once | ORAL | Status: AC
  Administered 2013-07-22: 1 via ORAL
  Filled 2013-07-22: qty 1

## 2013-07-22 NOTE — ED Provider Notes (Signed)
CSN: [id number]     Arrival date & time 07/22/13  2053 History   First MD Initiated Contact with Patient 07/22/13 2319     Chief Complaint  Patient presents with  . Abscess   (Consider location/radiation/quality/duration/timing/severity/associated sxs/prior Treatment) HPI History provided by pt.   Pt presents w/ abscess of right hip x 1 week.  Pain gradually worsened and is currently severe.  Drainage of purulent fluid noticed today.  No associated fever or other skin changes.  H/o abscesses but has not had one recently.   Past Medical History  Diagnosis Date  . Lymphocytosis   . Mediastinal lymphadenopathy   . Diabetes mellitus   . Hyperlipidemia   . HTN (hypertension)   . Asthma   . Depression   . Fracture of fibula, distal, right, closed 02/01/2012  . Hypothyroidism   . Arthritis    Past Surgical History  Procedure Laterality Date  . Carpal tunnel release  08-2009    right  . Tonsillectomy    . Eye surgery  2005    both cataracts  . Right leg surgery       plate at ankle   . Shoulder open rotator cuff repair Right 01/08/2013    Procedure: ROTATOR CUFF REPAIR SHOULDER OPEN;  Surgeon: Tobi Bastos, MD;  Location: [facility];  Service: Orthopedics;  Laterality: Right;  With Patch Graft   Family History  Problem Relation Age of Onset  . Allergies Sister   . Cancer Mother     stomach  . Pancreatic cancer Father    History  Substance Use Topics  . Smoking status: Current Every Day Smoker -- 0.50 packs/day for 6 years    Types: Cigarettes  . Smokeless tobacco: Never Used  . Alcohol Use: No     Comment: quit in 2005    Review of Systems  All other systems reviewed and are negative.    Allergies  Eszopiclone and Neosporin  Home Medications   Current Outpatient Rx  Name  Route  Sig  Dispense  Refill  . albuterol (PROVENTIL HFA;VENTOLIN HFA) 108 (90 BASE) MCG/ACT inhaler   Inhalation   Inhale 2 puffs into the lungs every 6 (six) hours as needed for wheezing or  shortness of breath.          Marland Kitchen atorvastatin (LIPITOR) 10 MG tablet   Oral   Take 10 mg by mouth every morning.          . Dapagliflozin Propanediol (FARXIGA) 5 MG TABS   Oral   Take 5 mg by mouth daily.   30 tablet   4   . gabapentin (NEURONTIN) 100 MG capsule   Oral   Take 1 capsule (100 mg total) by mouth 3 (three) times daily.   90 capsule   3   . glucose blood (ONETOUCH VERIO) test strip      Use as instructed to check blood sugars 3 times per day   100 each   5     Dx code 250.00   . HYDROcodone-acetaminophen (NORCO) 10-325 MG per tablet   Oral   Take 1 tablet by mouth every 6 (six) hours as needed for pain.         Marland Kitchen HYDROcodone-acetaminophen (NORCO/VICODIN) 5-325 MG per tablet   Oral   Take 2 tablets by mouth every 4 (four) hours as needed.   6 tablet   0   . insulin aspart (NOVOLOG) 100 UNIT/ML injection   Subcutaneous   Inject 25-30  Units into the skin 3 (three) times daily before meals. Per sliding scale If 250 - patient states will takes 25 units if 300 will take 30 units   1 vial   3   . insulin glargine (LANTUS) 100 UNIT/ML injection   Subcutaneous   Inject 40-50 Units into the skin at bedtime. Sliding scale         . levothyroxine (SYNTHROID, LEVOTHROID) 50 MCG tablet   Oral   Take 50 mcg by mouth every morning.          . metFORMIN (GLUCOPHAGE) 1000 MG tablet   Oral   Take 1,000 mg by mouth 2 (two) times daily with a meal.          . metoprolol (LOPRESSOR) 50 MG tablet   Oral   Take 1 tablet (50 mg total) by mouth 2 (two) times daily.   60 tablet   5   . oxyCODONE-acetaminophen (PERCOCET) 10-325 MG per tablet               . PARoxetine (PAXIL) 40 MG tablet   Oral   Take 40 mg by mouth at bedtime.          Marland Kitchen testosterone cypionate (DEPO-TESTOSTERONE) 200 MG/ML injection   Intramuscular   Inject 1.5 mLs (300 mg total) into the muscle every 14 (fourteen) days.   10 mL   3    BP 151/102  Pulse 75  Temp(Src) 98.2  F (36.8 C)  Resp 18  SpO2 98% Physical Exam  Nursing note and vitals reviewed. Constitutional: He is oriented to person, place, and time. He appears well-developed and well-nourished. No distress.  HENT:  Head: Normocephalic and atraumatic.  Eyes:  Normal appearance  Neck: Normal range of motion.  Pulmonary/Chest: Effort normal.  Musculoskeletal: Normal range of motion.  Neurological: He is alert and oriented to person, place, and time.  Skin:  2.5cm fluctuant abscess L lateral hip, with drainage of thick, purulent fluid.  No surrounding induration or erythema.   Psychiatric: He has a normal mood and affect. His behavior is normal.    ED Course  Procedures (including critical care time) INCISION AND DRAINAGE Performed by: Gertha Calkin E Consent: Verbal consent obtained. Risks and benefits: risks, benefits and alternatives were discussed Type: abscess  Body area: L lateral thigh  Anesthesia: local infiltration  Incision was made with a scalpel.  Local anesthetic: lidocaine 2% w/out epinephrine  Anesthetic total: 4 ml  Complexity: complex Blunt dissection to break up loculations  Drainage: purulent  Drainage amount: large  Packing material: none  Patient tolerance: Patient tolerated the procedure well with no immediate complications.    Labs Review Labs Reviewed - No data to display Imaging Review No results found.  EKG Interpretation   None       MDM  No diagnosis found. 63yo diabetic M presents w/ abscess of L hip.  No surrounding cellulitis.  Cbg 180.  This is reportedly his baseline.  Abscess I&D'd.  Pt d/c'd home w/ vicodin and bactrim/keflex because he is an uncontrolled diabetic.  Margins of abscess drawn.   Return precautions discussed.   Remer Macho, PA-C 07/23/13 [phone]

## 2013-07-22 NOTE — ED Notes (Addendum)
No response when called from waiting room x 2

## 2013-07-22 NOTE — ED Notes (Signed)
Per pt sts 1 week of abscess to left hip area. Denies drainage. sts it has gotten bigger.

## 2013-07-23 ENCOUNTER — Other Ambulatory Visit: Payer: Self-pay | Admitting: *Deleted

## 2013-07-23 ENCOUNTER — Other Ambulatory Visit: Payer: Self-pay | Admitting: Endocrinology

## 2013-07-23 LAB — GLUCOSE, CAPILLARY: Glucose-Capillary: 182 mg/dL — ABNORMAL HIGH (ref 70–99)

## 2013-07-23 MED ORDER — CEPHALEXIN 500 MG PO CAPS: 1000.0000 mg | ORAL_CAPSULE | Freq: Two times a day (BID) | ORAL | Status: DC

## 2013-07-23 MED ORDER — HYDROCODONE-ACETAMINOPHEN 5-325 MG PO TABS: 1.0000 | ORAL_TABLET | ORAL | Status: DC | PRN

## 2013-07-23 MED ORDER — DAPAGLIFLOZIN PROPANEDIOL 5 MG PO TABS: ORAL_TABLET | ORAL | Status: DC

## 2013-07-23 MED ORDER — SULFAMETHOXAZOLE-TMP DS 800-160 MG PO TABS: 1.0000 | ORAL_TABLET | Freq: Two times a day (BID) | ORAL | Status: DC

## 2013-07-23 NOTE — ED Provider Notes (Signed)
Medical screening examination/treatment/procedure(s) were performed by non-physician practitioner and as supervising physician I was immediately available for consultation/collaboration.    Kalman Drape, MD 07/23/13 [phone]

## 2013-07-23 NOTE — Discharge Instructions (Signed)
Take vicodin as prescribed for severe pain.  Do not drive within four hours of taking this medication (may cause drowsiness or confusion).   Take antibiotic as prescribed.  Take your diabetes medications as prescribed and follow up with your primary care doctor.  Please return to ER if redness spreads or you develop associated fever.

## 2013-07-27 ENCOUNTER — Encounter: Payer: Medicare Other | Admitting: Endocrinology

## 2013-07-27 ENCOUNTER — Encounter: Payer: Self-pay | Admitting: Endocrinology

## 2013-07-27 ENCOUNTER — Other Ambulatory Visit: Payer: Self-pay | Admitting: *Deleted

## 2013-07-27 ENCOUNTER — Other Ambulatory Visit: Payer: Medicare Other

## 2013-07-27 ENCOUNTER — Other Ambulatory Visit (INDEPENDENT_AMBULATORY_CARE_PROVIDER_SITE_OTHER): Payer: Medicare Other

## 2013-07-27 DIAGNOSIS — IMO0001 Reserved for inherently not codable concepts without codable children: Secondary | ICD-10-CM

## 2013-07-27 DIAGNOSIS — D751 Secondary polycythemia: Secondary | ICD-10-CM

## 2013-07-27 DIAGNOSIS — E236 Other disorders of pituitary gland: Secondary | ICD-10-CM

## 2013-07-27 DIAGNOSIS — E1165 Type 2 diabetes mellitus with hyperglycemia: Principal | ICD-10-CM

## 2013-07-27 DIAGNOSIS — E230 Hypopituitarism: Secondary | ICD-10-CM

## 2013-07-27 LAB — BASIC METABOLIC PANEL
BUN: 13 mg/dL (ref 6–23)
CO2: 28 meq/L (ref 19–32)
Calcium: 9.4 mg/dL (ref 8.4–10.5)
Chloride: 99 mEq/L (ref 96–112)
Creatinine, Ser: 0.9 mg/dL (ref 0.4–1.5)
GFR: 96.36 mL/min (ref >60.00)
Glucose, Bld: 308 mg/dL — ABNORMAL HIGH (ref 70–99)
POTASSIUM: 4.3 meq/L (ref 3.5–5.1)
SODIUM: 134 meq/L — AB (ref 135–145)

## 2013-07-27 LAB — CBC WITH DIFFERENTIAL/PLATELET
BASOS ABS: 0.1 10*3/uL (ref 0.0–0.1)
Basophils Relative: 0.7 % (ref 0.0–3.0)
Eosinophils Absolute: 0.3 10*3/uL (ref 0.0–0.7)
Eosinophils Relative: 3.1 % (ref 0.0–5.0)
HCT: 52.7 % — ABNORMAL HIGH (ref 39.0–52.0)
HEMOGLOBIN: 17.5 g/dL — AB (ref 13.0–17.0)
LYMPHS PCT: 22.1 % (ref 12.0–46.0)
Lymphs Abs: 2.4 10*3/uL (ref 0.7–4.0)
MCHC: 33.1 g/dL (ref 30.0–36.0)
MCV: 86.3 fl (ref 78.0–100.0)
MONOS PCT: 6.2 % (ref 3.0–12.0)
Monocytes Absolute: 0.7 10*3/uL (ref 0.1–1.0)
Neutro Abs: 7.5 10*3/uL (ref 1.4–7.7)
Neutrophils Relative %: 67.9 % (ref 43.0–77.0)
PLATELETS: 198 10*3/uL (ref 150.0–400.0)
RBC: 6.11 Mil/uL — ABNORMAL HIGH (ref 4.22–5.81)
RDW: 15.3 % — ABNORMAL HIGH (ref 11.5–14.6)
WBC: 11.1 10*3/uL — ABNORMAL HIGH (ref 4.5–10.5)

## 2013-07-27 LAB — MICROALBUMIN / CREATININE URINE RATIO
CREATININE, U: 106.2 mg/dL
MICROALB/CREAT RATIO: 1.1 mg/g (ref 0.0–30.0)
Microalb, Ur: 1.2 mg/dL (ref 0.0–1.9)

## 2013-07-27 MED ORDER — GABAPENTIN 100 MG PO CAPS: 100.0000 mg | ORAL_CAPSULE | Freq: Three times a day (TID) | ORAL | Status: DC
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
## 2013-07-28 LAB — FRUCTOSAMINE: Fructosamine: 398 umol/L — ABNORMAL HIGH (ref <285)

## 2013-07-28 LAB — TESTOSTERONE: Testosterone: 152.65 ng/dL — ABNORMAL LOW (ref 350.00–890.00)

## 2013-07-30 ENCOUNTER — Ambulatory Visit (INDEPENDENT_AMBULATORY_CARE_PROVIDER_SITE_OTHER): Payer: Medicare Other | Admitting: Endocrinology

## 2013-07-30 ENCOUNTER — Encounter: Payer: Self-pay | Admitting: Endocrinology

## 2013-07-30 ENCOUNTER — Other Ambulatory Visit: Payer: Self-pay | Admitting: *Deleted

## 2013-07-30 VITALS — BP 118/72 | HR 75 | Temp 98.3°F | Resp 16 | Ht 70.0 in | Wt 244.9 lb

## 2013-07-30 DIAGNOSIS — E039 Hypothyroidism, unspecified: Secondary | ICD-10-CM

## 2013-07-30 DIAGNOSIS — E236 Other disorders of pituitary gland: Secondary | ICD-10-CM

## 2013-07-30 DIAGNOSIS — E230 Hypopituitarism: Secondary | ICD-10-CM

## 2013-07-30 DIAGNOSIS — E785 Hyperlipidemia, unspecified: Secondary | ICD-10-CM

## 2013-07-30 DIAGNOSIS — E1142 Type 2 diabetes mellitus with diabetic polyneuropathy: Secondary | ICD-10-CM

## 2013-07-30 DIAGNOSIS — E1149 Type 2 diabetes mellitus with other diabetic neurological complication: Secondary | ICD-10-CM

## 2013-07-30 MED ORDER — DAPAGLIFLOZIN PROPANEDIOL 10 MG PO TABS: ORAL_TABLET | ORAL | Status: DC

## 2013-07-30 MED ORDER — INSULIN NPH (HUMAN) (ISOPHANE) 100 UNIT/ML ~~LOC~~ SUSP: SUBCUTANEOUS | Status: DC

## 2013-07-30 MED ORDER — GLUCOSE BLOOD VI STRP: ORAL_STRIP | Status: DC

## 2013-07-30 MED ORDER — "INSULIN SYRINGE 31G X 5/16"" 0.5 ML MISC": Status: DC

## 2013-07-30 MED ORDER — TRAMADOL HCL 50 MG PO TABS: 50.0000 mg | ORAL_TABLET | Freq: Three times a day (TID) | ORAL | Status: DC | PRN

## 2013-07-30 MED ORDER — INSULIN REGULAR HUMAN 100 UNIT/ML IJ SOLN: 25.0000 [IU] | Freq: Three times a day (TID) | INTRAMUSCULAR | Status: DC
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
## 2013-07-30 NOTE — Patient Instructions (Signed)
Novolin N 15 in am and 30 at bedtime  Regular 25-30; 30 min before meals  Please check blood sugars at least half the time about 2 hours after any meal and as directed on waking up. Please bring blood sugar monitor to each visit

## 2013-07-31 ENCOUNTER — Other Ambulatory Visit: Payer: Self-pay | Admitting: *Deleted

## 2013-08-01 IMAGING — CR DG CHEST 2V
2 series · 2 of 2 positions shown · non-contrast
Comparison: Chest x-ray 08/15/2011.

CLINICAL DATA: Cough and congestion.

CHEST - 2 VIEW

[w chest pa]
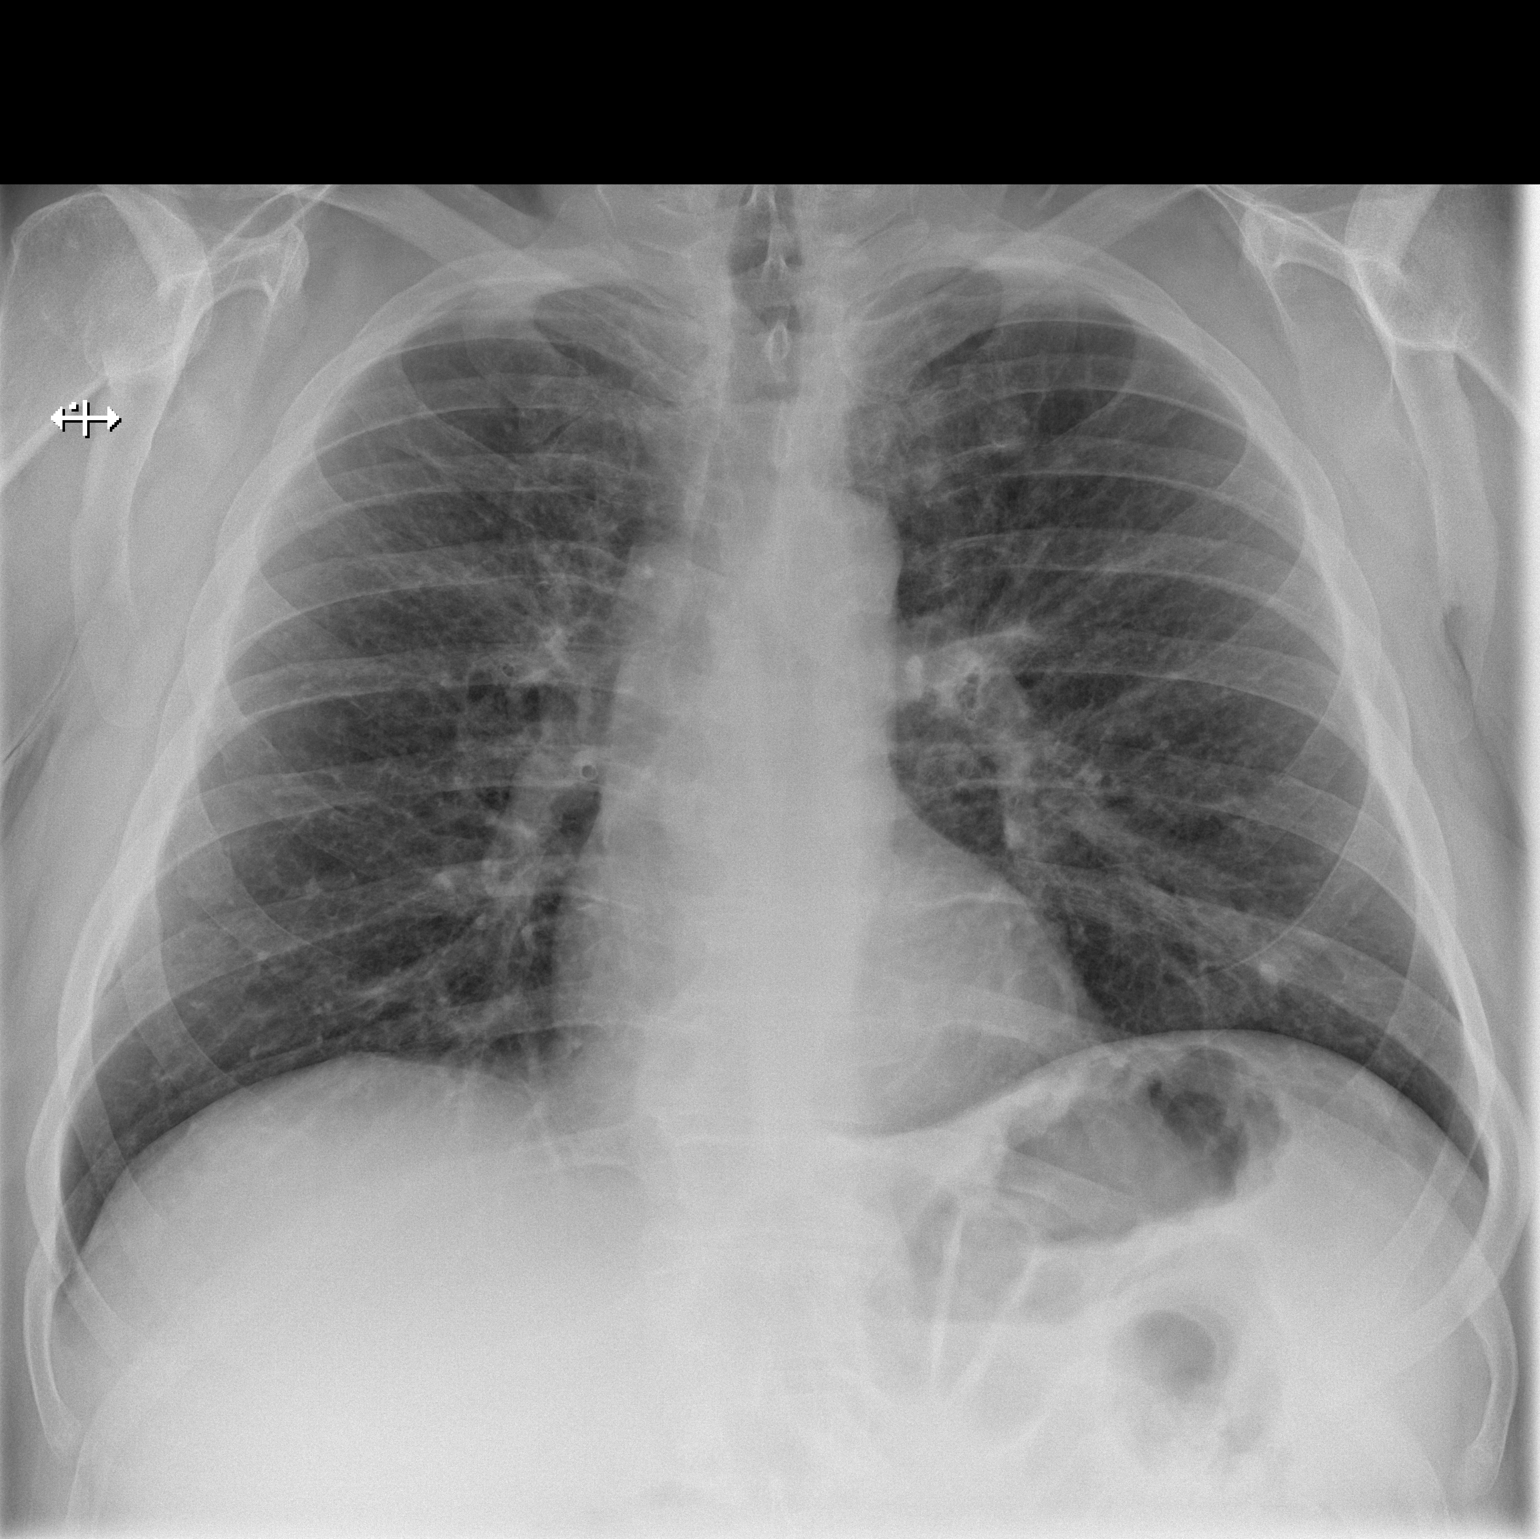

[w chest lat]
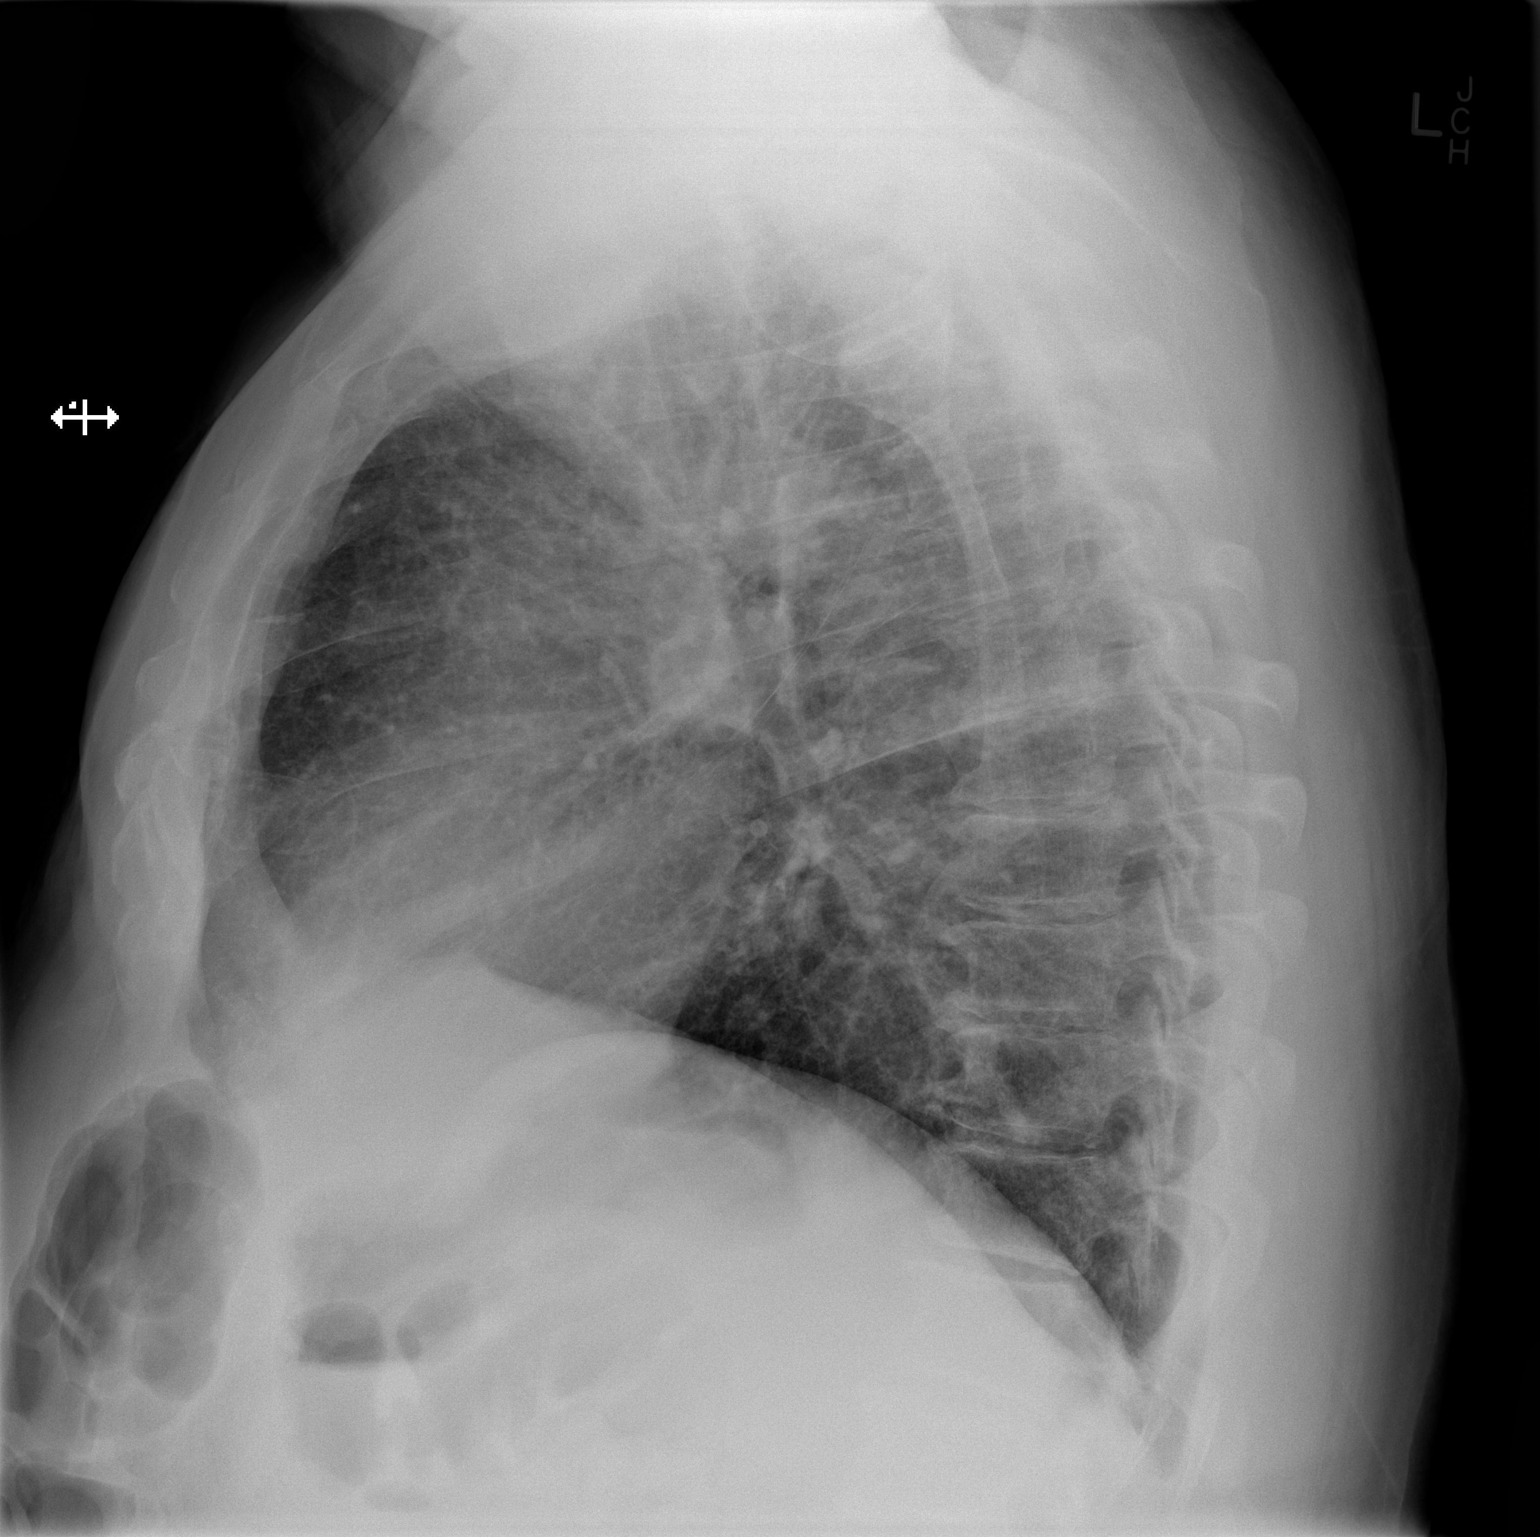

[2 of 2 positions shown; findings below may reference images not displayed]

FINDINGS: Lung volumes are normal.  Mild diffuse interstitial
prominence and apparent peribronchial cuffing.  No acute
consolidative air space disease.  No pleural effusions.  No
definite suspicious appearing pulmonary nodules or masses.  No
evidence of edema.  Heart size is normal.  Mediastinal contours are
unremarkable.
IMPRESSION: 1.  Findings, as above, suspicious for acute bronchitis.

## 2013-08-01 IMAGING — CR DG TOE GREAT 2+V*R*
3 series · 3 of 3 positions shown · non-contrast
Comparison: None.

***ADDENDUM*** CREATED: 08/22/2012 [DATE]

Comparison is made with prior MRI 08/18/2012 and prior x-ray
08/15/2012 no cortical destruction or bony erosion is identified.
No definite evidence of osteomyelitis.  Again noted soft tissue
defect in the plantar aspect.
***END ADDENDUM*** SIGNED BY: Hagios Maigari, M.D.
CLINICAL DATA: Foot pain
RIGHT GREAT TOE

[x toes ap right]
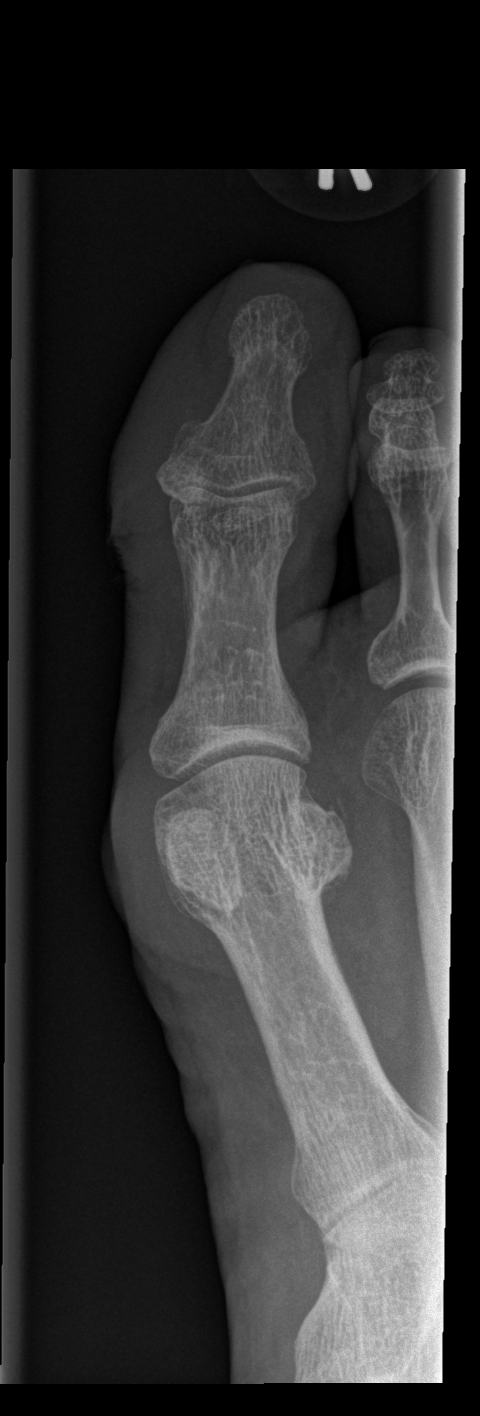

[x toes obl right]
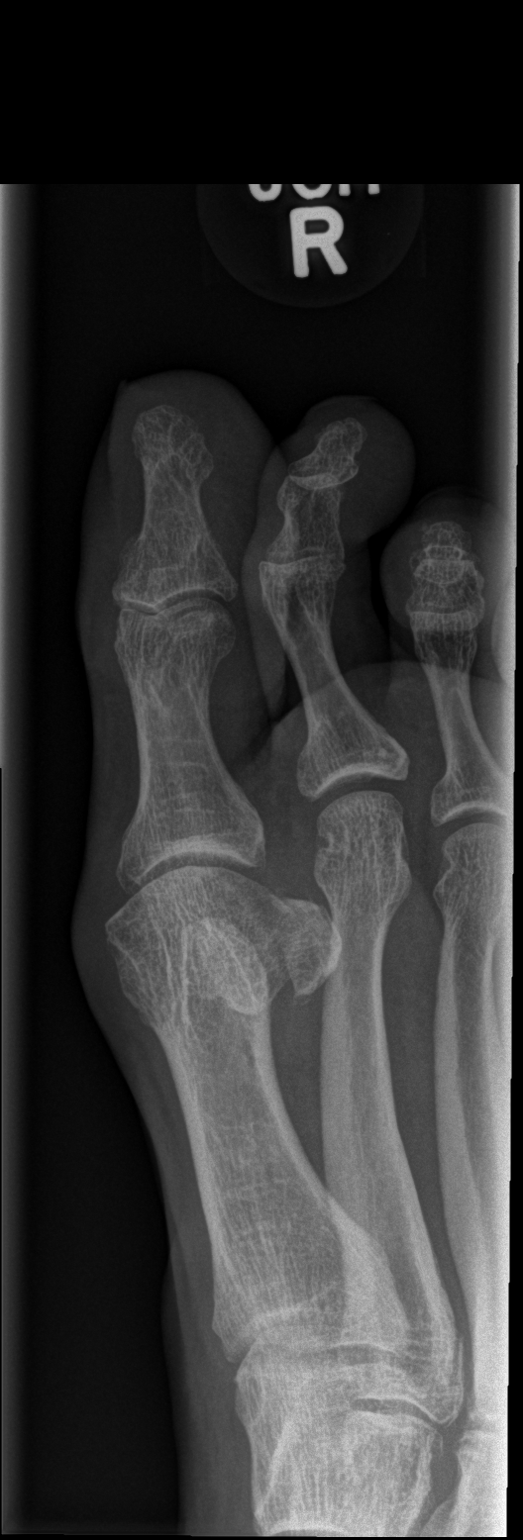

[x toes lat right]
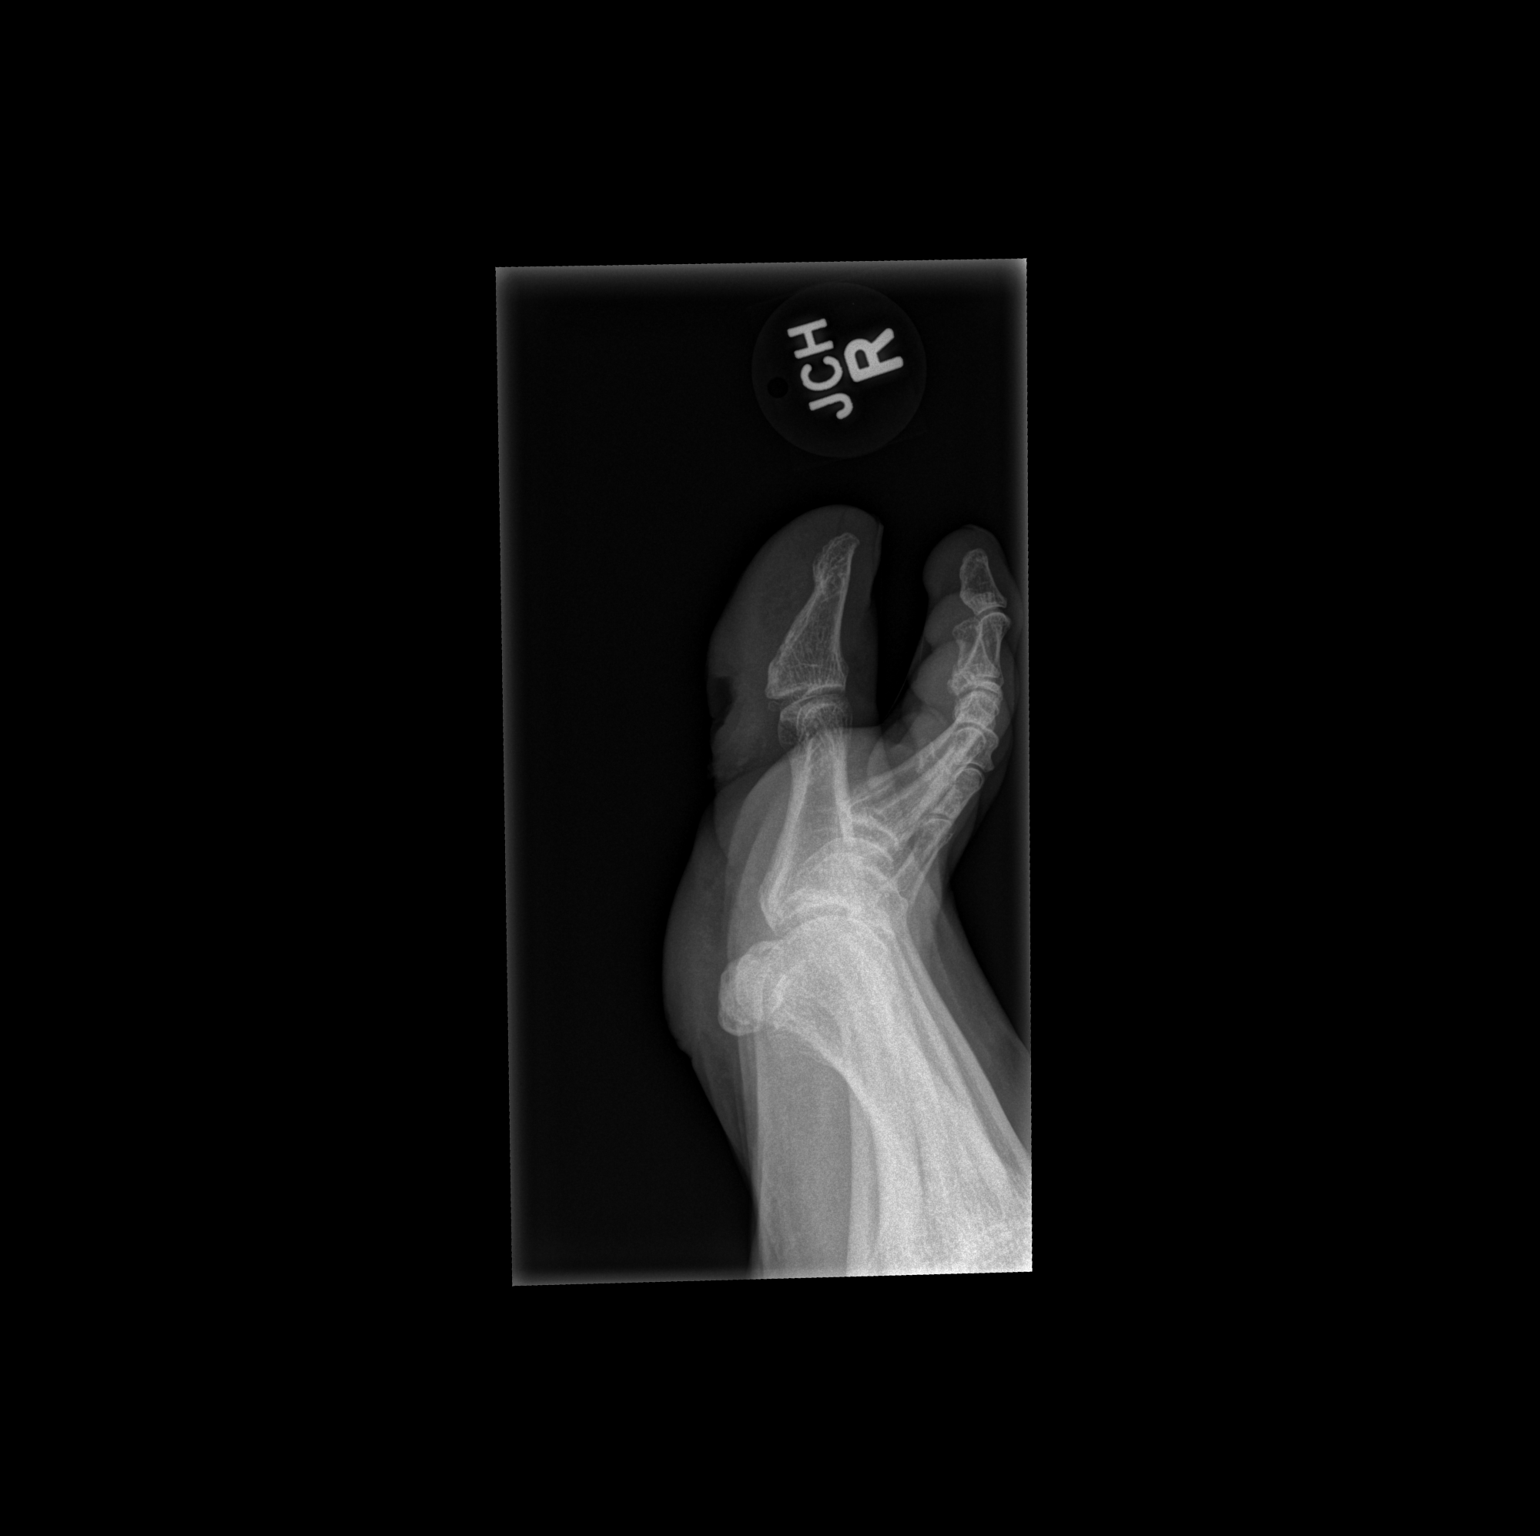

[3 of 3 positions shown; findings below may reference images not displayed]

FINDINGS: Three views of the right great toe submitted.  No acute
fracture or subluxation.  No radiopaque foreign body.
IMPRESSION: No acute fracture or subluxation.

## 2013-08-08 ENCOUNTER — Encounter (HOSPITAL_COMMUNITY): Payer: Self-pay | Admitting: Emergency Medicine

## 2013-08-08 ENCOUNTER — Emergency Department (HOSPITAL_COMMUNITY)
Admission: EM | Admit: 2013-08-08 | Discharge: 2013-08-09 | Disposition: A | Discharge status: Home / Self Care | Payer: Medicare Other | Attending: Emergency Medicine | Admitting: Emergency Medicine

## 2013-08-08 DIAGNOSIS — Z862 Personal history of diseases of the blood and blood-forming organs and certain disorders involving the immune mechanism: Secondary | ICD-10-CM | POA: Insufficient documentation

## 2013-08-08 DIAGNOSIS — F329 Major depressive disorder, single episode, unspecified: Secondary | ICD-10-CM | POA: Insufficient documentation

## 2013-08-08 DIAGNOSIS — Z794 Long term (current) use of insulin: Secondary | ICD-10-CM | POA: Insufficient documentation

## 2013-08-08 DIAGNOSIS — Z79899 Other long term (current) drug therapy: Secondary | ICD-10-CM | POA: Insufficient documentation

## 2013-08-08 DIAGNOSIS — E119 Type 2 diabetes mellitus without complications: Secondary | ICD-10-CM | POA: Insufficient documentation

## 2013-08-08 DIAGNOSIS — R05 Cough: Secondary | ICD-10-CM | POA: Insufficient documentation

## 2013-08-08 DIAGNOSIS — E039 Hypothyroidism, unspecified: Secondary | ICD-10-CM | POA: Insufficient documentation

## 2013-08-08 DIAGNOSIS — S4980XA Other specified injuries of shoulder and upper arm, unspecified arm, initial encounter: Secondary | ICD-10-CM | POA: Insufficient documentation

## 2013-08-08 DIAGNOSIS — M129 Arthropathy, unspecified: Secondary | ICD-10-CM | POA: Insufficient documentation

## 2013-08-08 DIAGNOSIS — F17200 Nicotine dependence, unspecified, uncomplicated: Secondary | ICD-10-CM | POA: Insufficient documentation

## 2013-08-08 DIAGNOSIS — Z8781 Personal history of (healed) traumatic fracture: Secondary | ICD-10-CM | POA: Insufficient documentation

## 2013-08-08 DIAGNOSIS — W010XXA Fall on same level from slipping, tripping and stumbling without subsequent striking against object, initial encounter: Secondary | ICD-10-CM | POA: Insufficient documentation

## 2013-08-08 DIAGNOSIS — S46909A Unspecified injury of unspecified muscle, fascia and tendon at shoulder and upper arm level, unspecified arm, initial encounter: Secondary | ICD-10-CM | POA: Insufficient documentation

## 2013-08-08 DIAGNOSIS — Y929 Unspecified place or not applicable: Secondary | ICD-10-CM | POA: Insufficient documentation

## 2013-08-08 DIAGNOSIS — F3289 Other specified depressive episodes: Secondary | ICD-10-CM | POA: Insufficient documentation

## 2013-08-08 DIAGNOSIS — J45909 Unspecified asthma, uncomplicated: Secondary | ICD-10-CM | POA: Insufficient documentation

## 2013-08-08 DIAGNOSIS — J3489 Other specified disorders of nose and nasal sinuses: Secondary | ICD-10-CM | POA: Insufficient documentation

## 2013-08-08 DIAGNOSIS — R059 Cough, unspecified: Secondary | ICD-10-CM | POA: Insufficient documentation

## 2013-08-08 DIAGNOSIS — Y93E9 Activity, other interior property and clothing maintenance: Secondary | ICD-10-CM | POA: Insufficient documentation

## 2013-08-08 DIAGNOSIS — I10 Essential (primary) hypertension: Secondary | ICD-10-CM | POA: Insufficient documentation

## 2013-08-08 DIAGNOSIS — J029 Acute pharyngitis, unspecified: Secondary | ICD-10-CM

## 2013-08-08 DIAGNOSIS — G8929 Other chronic pain: Secondary | ICD-10-CM

## 2013-08-08 DIAGNOSIS — M25511 Pain in right shoulder: Secondary | ICD-10-CM

## 2013-08-08 DIAGNOSIS — Z9889 Other specified postprocedural states: Secondary | ICD-10-CM | POA: Insufficient documentation

## 2013-08-08 DIAGNOSIS — X500XXA Overexertion from strenuous movement or load, initial encounter: Secondary | ICD-10-CM | POA: Insufficient documentation

## 2013-08-08 DIAGNOSIS — E785 Hyperlipidemia, unspecified: Secondary | ICD-10-CM | POA: Insufficient documentation

## 2013-08-08 MED ORDER — LIDOCAINE VISCOUS 2 % MT SOLN
15.0000 mL | Freq: Once | OROMUCOSAL | Status: AC
Start: 2013-08-09 — End: 2013-08-09
  Administered 2013-08-09: 15 mL via OROMUCOSAL
  Filled 2013-08-08: qty 15

## 2013-08-08 MED ORDER — HYDROCODONE-ACETAMINOPHEN 5-325 MG PO TABS
1.0000 | ORAL_TABLET | Freq: Once | ORAL | Status: AC
  Administered 2013-08-09: 1 via ORAL
  Filled 2013-08-08: qty 1

## 2013-08-08 NOTE — ED Notes (Addendum)
Fell when moving furniture and rt. Arm hurts. Heard a "pop" in his rt. Shoulder. Limited rom, which is normal for him b/c of rt. Rotator cuff surgery.  Sore throat for two weeks.

## 2013-08-08 NOTE — ED Provider Notes (Signed)
CSN: [id number]     Arrival date & time 08/08/13  2144 History   First MD Initiated Contact with Patient 08/08/13 2209     Chief Complaint  Patient presents with  . Fall  . Sore Throat   (Consider location/radiation/quality/duration/timing/severity/associated sxs/prior Treatment) HPI Comments: Patient is a 63 yo M PMHx significant for DM, HLD, HTN, Asthma, Depression, Hypothyroidism, Arthritis, s/p right open rotator cuff repair presenting to the ED for two complaints. The patient's first complaint is worsened right shoulder pain that occurred after he slipped while trying to move a mattress yesterday causing him to fall on his right shoulder. He reports gradually worsening pain since the incident, but denies any changes in his limited ROM s/p surgery. No alleviating factors. Pain is aggravated by movement. He denies any chest pain, shortness of breath, headache, lightheadedness, dizziness, syncopal episode prior to fall. He denies hitting his head or losing consciousness. Patient's second complaint is 2 weeks of non-productive cough, nasal congestion and rhinorrhea. His pain is worsened with eating and drinking. His pain is alleviated with warm liquids. Denies any CP, SOB, HA, lightheadedness, dizziness, abdominal pain, nausea, vomiting, diarrhea, new or worsening numbness and tingling to right arm, decreased strength to right arm.  Patient is a 63 y.o. male presenting with fall and pharyngitis.  Fall Associated symptoms include congestion, joint swelling, myalgias and a sore throat. Pertinent negatives include no chest pain, chills, fever, headaches, nausea, numbness, vomiting or weakness.  Sore Throat Associated symptoms include congestion, joint swelling, myalgias and a sore throat. Pertinent negatives include no chest pain, chills, fever, headaches, nausea, numbness, vomiting or weakness.    Past Medical History  Diagnosis Date  . Lymphocytosis   . Mediastinal lymphadenopathy   . Diabetes  mellitus   . Hyperlipidemia   . HTN (hypertension)   . Asthma   . Depression   . Fracture of fibula, distal, right, closed 02/01/2012  . Hypothyroidism   . Arthritis    Past Surgical History  Procedure Laterality Date  . Carpal tunnel release  08-2009    right  . Tonsillectomy    . Eye surgery  2005    both cataracts  . Right leg surgery       plate at ankle   . Shoulder open rotator cuff repair Right 01/08/2013    Procedure: ROTATOR CUFF REPAIR SHOULDER OPEN;  Surgeon: Tobi Bastos, MD;  Location: [facility];  Service: Orthopedics;  Laterality: Right;  With Patch Graft   Family History  Problem Relation Age of Onset  . Allergies Sister   . Cancer Mother     stomach  . Pancreatic cancer Father    History  Substance Use Topics  . Smoking status: Current Every Day Smoker -- 0.50 packs/day for 6 years    Types: Cigarettes  . Smokeless tobacco: Never Used  . Alcohol Use: No     Comment: quit in 2005    Review of Systems  Constitutional: Negative for fever and chills.  HENT: Positive for congestion, rhinorrhea and sore throat.   Respiratory: Negative for shortness of breath.   Cardiovascular: Negative for chest pain.  Gastrointestinal: Negative for nausea and vomiting.  Musculoskeletal: Positive for joint swelling and myalgias.  Neurological: Negative for syncope, weakness, numbness and headaches.  All other systems reviewed and are negative.    Allergies  Eszopiclone and Neosporin  Home Medications   Current Outpatient Rx  Name  Route  Sig  Dispense  Refill  . albuterol (PROVENTIL HFA;VENTOLIN  HFA) 108 (90 BASE) MCG/ACT inhaler   Inhalation   Inhale 2 puffs into the lungs every 6 (six) hours as needed for wheezing or shortness of breath.          Marland Kitchen atorvastatin (LIPITOR) 10 MG tablet   Oral   Take 10 mg by mouth every morning.          . Dapagliflozin Propanediol (FARXIGA) 10 MG TABS      Take 1 tablet daily   30 tablet   1   . gabapentin (NEURONTIN)  100 MG capsule   Oral   Take 1 capsule (100 mg total) by mouth 3 (three) times daily.   90 capsule   5   . insulin aspart (NOVOLOG) 100 UNIT/ML injection   Subcutaneous   Inject 30-35 Units into the skin 3 (three) times daily before meals. Per sliding scale If 250 - patient states will takes 25 units if 300 will take 30 units         . insulin glargine (LANTUS) 100 UNIT/ML injection   Subcutaneous   Inject 40-50 Units into the skin at bedtime. Sliding scale         . levothyroxine (SYNTHROID, LEVOTHROID) 50 MCG tablet   Oral   Take 50 mcg by mouth every morning.          . metFORMIN (GLUCOPHAGE) 1000 MG tablet   Oral   Take 1,000 mg by mouth 2 (two) times daily with a meal.          . metoprolol (LOPRESSOR) 50 MG tablet   Oral   Take 1 tablet (50 mg total) by mouth 2 (two) times daily.   60 tablet   5   . naproxen sodium (ANAPROX) 220 MG tablet   Oral   Take 440 mg by mouth daily as needed (for pain).         Marland Kitchen PARoxetine (PAXIL) 40 MG tablet   Oral   Take 40 mg by mouth at bedtime.          Marland Kitchen testosterone cypionate (DEPO-TESTOSTERONE) 200 MG/ML injection   Intramuscular   Inject 1.5 mLs (300 mg total) into the muscle every 14 (fourteen) days.   10 mL   3   . traMADol (ULTRAM) 50 MG tablet   Oral   Take 1 tablet (50 mg total) by mouth every 8 (eight) hours as needed.   60 tablet   3   . insulin NPH Human (NOVOLIN N) 100 UNIT/ML injection      Inject 15 units in am and 30 units in pm   20 mL   1   . insulin regular (NOVOLIN R) 100 units/mL injection   Subcutaneous   Inject 0.25-0.3 mLs (25-30 Units total) into the skin 3 (three) times daily before meals.   10 mL   1    BP 119/74  Pulse 66  Temp(Src) 97 F (36.1 C) (Oral)  Resp 18  Wt 244 lb (110.678 kg)  SpO2 96% Physical Exam  Constitutional: He is oriented to person, place, and time. He appears well-developed and well-nourished. No distress.  HENT:  Head: Normocephalic and  atraumatic.  Right Ear: External ear normal.  Left Ear: External ear normal.  Nose: Nose normal.  Mouth/Throat: Uvula is midline, oropharynx is clear and moist and mucous membranes are normal. No trismus in the jaw. No uvula swelling. No oropharyngeal exudate.  Eyes: Conjunctivae are normal.  Neck: Normal range of motion. Neck supple.  Cardiovascular: Normal  rate, regular rhythm and normal heart sounds.   Pulmonary/Chest: Effort normal and breath sounds normal. No stridor. No respiratory distress. He exhibits no tenderness.  Abdominal: Soft.  Musculoskeletal: Normal range of motion.       Right shoulder: He exhibits tenderness and deformity (baseline s/p surgery).       Left shoulder: Normal.       Cervical back: Normal.  ROM and strength intact at baseline.   Lymphadenopathy:    He has no cervical adenopathy.  Neurological: He is alert and oriented to person, place, and time. He has normal strength. No cranial nerve deficit. Gait normal. GCS eye subscore is 4. GCS verbal subscore is 5. GCS motor subscore is 6.  Sensation in RUE at baseline.  Strength of RUE at baseline.   Skin: Skin is warm and dry. He is not diaphoretic.  Psychiatric: He has a normal mood and affect.    ED Course  Procedures (including critical care time) Medications  lidocaine (XYLOCAINE) 2 % viscous mouth solution 15 mL (15 mLs Mouth/Throat Given 08/09/13 0003)  HYDROcodone-acetaminophen (NORCO/VICODIN) 5-325 MG per tablet 1 tablet (1 tablet Oral Given 08/09/13 0003)    Labs Review Labs Reviewed - No data to display Imaging Review No results found.  EKG Interpretation   None       MDM  No diagnosis found.  Filed Vitals:   08/08/13 2330  BP: 119/74  Pulse: 66  Temp:   Resp:    Afebrile, NAD, non-toxic appearing, AAOx4. No neural focal deficits on examination.   1) Shoulder Pain: Patient with R shoulder pain after mechanical fall after slipping while attempting to move a heavy piece of furniture by  himself. no LOC. Had no precipitating chest pain, shortness of breath, headache, dizziness or lightheadedness or other worrisome sign or symptom. Neurovascularly intact. Sensation at baseline. Range of motion at baseline. No obvious deformity appreciated. Will treat with pain medication and advise Dr. Gladstone Lighter followup this week. Patient did decline any imaging of his shoulder during ER visit. Did not fill up the patient emergently needs any imaging of his shoulder. Agreeable to let his orthopedic doctor order any necessary imaging as outpatient.  2) Viral Pharyngitis: Pt afebrile without tonsillar exudate, negative strep. Presents with mild cervical lymphadenopathy, & dysphagia; diagnosis of viral pharyngitis. No abx indicated. DC w symptomatic tx for pain  Pt does not appear dehydrated, but did discuss importance of water rehydration. Presentation non concerning for PTA or infxn spread to soft tissue. No trismus or uvula deviation. Specific return precautions discussed. Pt able to drink water in ED without difficulty with intact air way. Recommended PCP follow up.  Return precautions discussed. Patient agreeable to plan. Patient stable at time of discharge.  Harlow Mares, PA-C 08/09/13 [phone]

## 2013-08-09 LAB — RAPID STREP SCREEN (MED CTR MEBANE ONLY): Streptococcus, Group A Screen (Direct): NEGATIVE

## 2013-08-09 MED ORDER — HYDROCODONE-ACETAMINOPHEN 5-325 MG PO TABS: 1.0000 | ORAL_TABLET | Freq: Four times a day (QID) | ORAL | Status: DC | PRN

## 2013-08-09 MED ORDER — LIDOCAINE VISCOUS 2 % MT SOLN: 15.0000 mL | Freq: Four times a day (QID) | OROMUCOSAL | Status: DC | PRN

## 2013-08-09 NOTE — Discharge Instructions (Signed)
Please follow up with your primary care physician in 1-2 days. If you do not have one please call the [facility] number listed above. Please follow up with Dr. Gladstone Lighter to schedule a follow up appointment. Please take pain medication as prescribed and as needed for pain. Please do not drive on narcotic pain medication. Please take Xylocaine as prescribed. Please read all discharge instructions and return precautions.   Viral Pharyngitis Viral pharyngitis is a viral infection that produces redness, pain, and swelling (inflammation) of the throat. It can spread from person to person (contagious). CAUSES Viral pharyngitis is caused by inhaling a large amount of certain germs called viruses. Many different viruses cause viral pharyngitis. SYMPTOMS Symptoms of viral pharyngitis include:  Sore throat.  Tiredness.  Stuffy nose.  Low-grade fever.  Congestion.  Cough. TREATMENT Treatment includes rest, drinking plenty of fluids, and the use of over-the-counter medication (approved by your caregiver). HOME CARE INSTRUCTIONS   Drink enough fluids to keep your urine clear or pale yellow.  Eat soft, cold foods such as ice cream, frozen ice pops, or gelatin dessert.  Gargle with warm salt water (1 tsp salt per 1 qt of water).  If over age 66, throat lozenges may be used safely.  Only take over-the-counter or prescription medicines for pain, discomfort, or fever as directed by your caregiver. Do not take aspirin. To help prevent spreading viral pharyngitis to others, avoid:  Mouth-to-mouth contact with others.  Sharing utensils for eating and drinking.  Coughing around others. SEEK MEDICAL CARE IF:   You are better in a few days, then become worse.  You have a fever or pain not helped by pain medicines.  There are any other changes that concern you. Document Released: 03/28/2005 Document Revised: 09/10/2011 Document Reviewed: 08/24/2010 [facility] Patient  Information 2014 [place], [place].   Shoulder Pain The shoulder is the joint that connects your arms to your body. The bones that form the shoulder joint include the upper arm bone (humerus), the shoulder blade (scapula), and the collarbone (clavicle). The top of the humerus is shaped like a ball and fits into a rather flat socket on the scapula (glenoid cavity). A combination of muscles and strong, fibrous tissues that connect muscles to bones (tendons) support your shoulder joint and hold the ball in the socket. Small, fluid-filled sacs (bursae) are located in different areas of the joint. They act as cushions between the bones and the overlying soft tissues and help reduce friction between the gliding tendons and the bone as you move your arm. Your shoulder joint allows a wide range of motion in your arm. This range of motion allows you to do things like scratch your back or throw a ball. However, this range of motion also makes your shoulder more prone to pain from overuse and injury. Causes of shoulder pain can originate from both injury and overuse and usually can be grouped in the following four categories:  Redness, swelling, and pain (inflammation) of the tendon (tendinitis) or the bursae (bursitis).  Instability, such as a dislocation of the joint.  Inflammation of the joint (arthritis).  Broken bone (fracture). HOME CARE INSTRUCTIONS   Apply ice to the sore area.  Put ice in a plastic bag.  Place a towel between your skin and the bag.  Leave the ice on for 15-20 minutes, 03-04 times per day for the first 2 days.  Stop using cold packs if they do not help with the pain.  If you  have a shoulder sling or immobilizer, wear it as long as your caregiver instructs. Only remove it to shower or bathe. Move your arm as little as possible, but keep your hand moving to prevent swelling.  Squeeze a soft ball or foam pad as much as possible to help prevent swelling.  Only take  over-the-counter or prescription medicines for pain, discomfort, or fever as directed by your caregiver. SEEK MEDICAL CARE IF:   Your shoulder pain increases, or new pain develops in your arm, hand, or fingers.  Your hand or fingers become cold and numb.  Your pain is not relieved with medicines. SEEK IMMEDIATE MEDICAL CARE IF:   Your arm, hand, or fingers are numb or tingling.  Your arm, hand, or fingers are significantly swollen or turn white or blue. MAKE SURE YOU:   Understand these instructions.  Will watch your condition.  Will get help right away if you are not doing well or get worse. Document Released: 03/28/2005 Document Revised: 03/12/2012 Document Reviewed: 06/02/2011 [facility] Patient Information 2014 [place].  RICE: Routine Care for Injuries The routine care of many injuries includes Rest, Ice, Compression, and Elevation (RICE). HOME CARE INSTRUCTIONS  Rest is needed to allow your body to heal. Routine activities can usually be resumed when comfortable. Injured tendons and bones can take up to 6 weeks to heal. Tendons are the cord-like structures that attach muscle to bone.  Ice following an injury helps keep the swelling down and reduces pain.  Put ice in a plastic bag.  Place a towel between your skin and the bag.  Leave the ice on for 15-20 minutes, 03-04 times a day. Do this while awake, for the first 24 to 48 hours. After that, continue as directed by your caregiver.  Compression helps keep swelling down. It also gives support and helps with discomfort. If an elastic bandage has been applied, it should be removed and reapplied every 3 to 4 hours. It should not be applied tightly, but firmly enough to keep swelling down. Watch fingers or toes for swelling, bluish discoloration, coldness, numbness, or excessive pain. If any of these problems occur, remove the bandage and reapply loosely. Contact your caregiver if these problems continue.  Elevation helps  reduce swelling and decreases pain. With extremities, such as the arms, hands, legs, and feet, the injured area should be placed near or above the level of the heart, if possible. SEEK IMMEDIATE MEDICAL CARE IF:  You have persistent pain and swelling.  You develop redness, numbness, or unexpected weakness.  Your symptoms are getting worse rather than improving after several days. These symptoms may indicate that further evaluation or further X-rays are needed. Sometimes, X-rays may not show a small broken bone (fracture) until 1 week or 10 days later. Make a follow-up appointment with your caregiver. Ask when your X-ray results will be ready. Make sure you get your X-ray results. Document Released: 09/30/2000 Document Revised: 09/10/2011 Document Reviewed: 11/17/2010 [facility] Patient Information 2014 [place], [place].

## 2013-08-10 NOTE — ED Provider Notes (Signed)
Medical screening examination/treatment/procedure(s) were performed by non-physician practitioner and as supervising physician I was immediately available for consultation/collaboration.    Kalman Drape, MD 08/10/13 [phone]

## 2013-08-11 LAB — CULTURE, GROUP A STREP

## 2013-08-30 ENCOUNTER — Encounter (HOSPITAL_COMMUNITY): Payer: Self-pay | Admitting: Emergency Medicine

## 2013-08-30 ENCOUNTER — Emergency Department (HOSPITAL_COMMUNITY)
Admission: EM | Admit: 2013-08-30 | Discharge: 2013-08-30 | Disposition: A | Discharge status: Home / Self Care | Payer: Medicare Other | Attending: Emergency Medicine | Admitting: Emergency Medicine

## 2013-08-30 DIAGNOSIS — I10 Essential (primary) hypertension: Secondary | ICD-10-CM | POA: Insufficient documentation

## 2013-08-30 DIAGNOSIS — Z9119 Patient's noncompliance with other medical treatment and regimen: Secondary | ICD-10-CM | POA: Insufficient documentation

## 2013-08-30 DIAGNOSIS — E785 Hyperlipidemia, unspecified: Secondary | ICD-10-CM | POA: Insufficient documentation

## 2013-08-30 DIAGNOSIS — Z9114 Patient's other noncompliance with medication regimen: Secondary | ICD-10-CM

## 2013-08-30 DIAGNOSIS — Z862 Personal history of diseases of the blood and blood-forming organs and certain disorders involving the immune mechanism: Secondary | ICD-10-CM | POA: Insufficient documentation

## 2013-08-30 DIAGNOSIS — R739 Hyperglycemia, unspecified: Secondary | ICD-10-CM

## 2013-08-30 DIAGNOSIS — L97509 Non-pressure chronic ulcer of other part of unspecified foot with unspecified severity: Secondary | ICD-10-CM | POA: Insufficient documentation

## 2013-08-30 DIAGNOSIS — Z79899 Other long term (current) drug therapy: Secondary | ICD-10-CM | POA: Insufficient documentation

## 2013-08-30 DIAGNOSIS — E119 Type 2 diabetes mellitus without complications: Secondary | ICD-10-CM | POA: Insufficient documentation

## 2013-08-30 DIAGNOSIS — E039 Hypothyroidism, unspecified: Secondary | ICD-10-CM | POA: Insufficient documentation

## 2013-08-30 DIAGNOSIS — Z794 Long term (current) use of insulin: Secondary | ICD-10-CM | POA: Insufficient documentation

## 2013-08-30 DIAGNOSIS — J45909 Unspecified asthma, uncomplicated: Secondary | ICD-10-CM | POA: Insufficient documentation

## 2013-08-30 DIAGNOSIS — F17200 Nicotine dependence, unspecified, uncomplicated: Secondary | ICD-10-CM | POA: Insufficient documentation

## 2013-08-30 DIAGNOSIS — F329 Major depressive disorder, single episode, unspecified: Secondary | ICD-10-CM | POA: Insufficient documentation

## 2013-08-30 DIAGNOSIS — M129 Arthropathy, unspecified: Secondary | ICD-10-CM | POA: Insufficient documentation

## 2013-08-30 DIAGNOSIS — F3289 Other specified depressive episodes: Secondary | ICD-10-CM | POA: Insufficient documentation

## 2013-08-30 DIAGNOSIS — Z91199 Patient's noncompliance with other medical treatment and regimen due to unspecified reason: Secondary | ICD-10-CM | POA: Insufficient documentation

## 2013-08-30 DIAGNOSIS — Z8781 Personal history of (healed) traumatic fracture: Secondary | ICD-10-CM | POA: Insufficient documentation

## 2013-08-30 LAB — COMPREHENSIVE METABOLIC PANEL
ALT: 16 U/L (ref 0–53)
AST: 14 U/L (ref 0–37)
Albumin: 3.6 g/dL (ref 3.5–5.2)
Alkaline Phosphatase: 106 U/L (ref 39–117)
BUN: 15 mg/dL (ref 6–23)
CALCIUM: 9.7 mg/dL (ref 8.4–10.5)
CO2: 28 meq/L (ref 19–32)
Chloride: 93 mEq/L — ABNORMAL LOW (ref 96–112)
Creatinine, Ser: 0.66 mg/dL (ref 0.50–1.35)
GFR calc Af Amer: >90 mL/min (ref >90)
Glucose, Bld: 430 mg/dL — ABNORMAL HIGH (ref 70–99)
POTASSIUM: 4.9 meq/L (ref 3.7–5.3)
SODIUM: 133 meq/L — AB (ref 137–147)
Total Bilirubin: 0.5 mg/dL (ref 0.3–1.2)
Total Protein: 7.3 g/dL (ref 6.0–8.3)

## 2013-08-30 LAB — CBG MONITORING, ED
GLUCOSE-CAPILLARY: 391 mg/dL — AB (ref 70–99)
Glucose-Capillary: 415 mg/dL — ABNORMAL HIGH (ref 70–99)
Glucose-Capillary: 379 mg/dL — ABNORMAL HIGH (ref 70–99)

## 2013-08-30 LAB — CBC
HCT: 47.3 % (ref 39.0–52.0)
Hemoglobin: 17.4 g/dL — ABNORMAL HIGH (ref 13.0–17.0)
MCH: 31.1 pg (ref 26.0–34.0)
MCHC: 36.8 g/dL — ABNORMAL HIGH (ref 30.0–36.0)
MCV: 84.6 fL (ref 78.0–100.0)
PLATELETS: 176 10*3/uL (ref 150–400)
RBC: 5.59 MIL/uL (ref 4.22–5.81)
RDW: 14.3 % (ref 11.5–15.5)
WBC: 12.5 10*3/uL — ABNORMAL HIGH (ref 4.0–10.5)

## 2013-08-30 MED ORDER — INSULIN ASPART 100 UNIT/ML ~~LOC~~ SOLN
20.0000 [IU] | Freq: Once | SUBCUTANEOUS | Status: AC
  Administered 2013-08-30: 20 [IU] via SUBCUTANEOUS
  Filled 2013-08-30: qty 1

## 2013-08-30 MED ORDER — OXYCODONE-ACETAMINOPHEN 5-325 MG PO TABS: 1.0000 | ORAL_TABLET | ORAL | Status: DC | PRN

## 2013-08-30 MED ORDER — PIPERACILLIN-TAZOBACTAM 3.375 G IVPB 30 MIN
3.3750 g | Freq: Once | INTRAVENOUS | Status: AC
  Administered 2013-08-30: 3.375 g via INTRAVENOUS
  Filled 2013-08-30: qty 50

## 2013-08-30 MED ORDER — INSULIN GLARGINE 100 UNIT/ML ~~LOC~~ SOLN
35.0000 [IU] | Freq: Once | SUBCUTANEOUS | Status: AC
  Administered 2013-08-30: 35 [IU] via SUBCUTANEOUS
  Filled 2013-08-30: qty 0.35

## 2013-08-30 MED ORDER — HYDROMORPHONE HCL PF 1 MG/ML IJ SOLN
1.0000 mg | Freq: Once | INTRAMUSCULAR | Status: AC
  Administered 2013-08-30: 1 mg via INTRAVENOUS
  Filled 2013-08-30: qty 1

## 2013-08-30 MED ORDER — VANCOMYCIN HCL IN DEXTROSE 1-5 GM/200ML-% IV SOLN
1000.0000 mg | Freq: Once | INTRAVENOUS | Status: AC
  Administered 2013-08-30: 1000 mg via INTRAVENOUS
  Filled 2013-08-30: qty 200

## 2013-08-30 MED ORDER — ONDANSETRON HCL 4 MG/2ML IJ SOLN
4.0000 mg | Freq: Once | INTRAMUSCULAR | Status: AC
  Administered 2013-08-30: 4 mg via INTRAVENOUS
  Filled 2013-08-30: qty 2

## 2013-08-30 MED ORDER — CLINDAMYCIN HCL 300 MG PO CAPS: 300.0000 mg | ORAL_CAPSULE | Freq: Four times a day (QID) | ORAL | Status: DC

## 2013-08-30 MED ORDER — SODIUM CHLORIDE 0.9 % IV BOLUS (SEPSIS)
2000.0000 mL | Freq: Once | INTRAVENOUS | Status: AC
  Administered 2013-08-30: 2000 mL via INTRAVENOUS

## 2013-08-30 NOTE — ED Provider Notes (Signed)
Vision has received 2 L of IV fluid, his insulin his sugars going in the right direction, decreased by greater than 50 points in less than an hour.  Derek Calderon states he is feeling, better.  He reassures me that his medication/insulin.  Will be her arriving tomorrow via [employer]. He is being discharged per Dr. Wilburt Calderon instructions  [name], NP 08/30/13 2203

## 2013-08-30 NOTE — ED Notes (Signed)
Dr. Wentz at bedside. 

## 2013-08-30 NOTE — ED Notes (Signed)
Pt states he has 2 painful bleeding sores that started while trying to trim callous 7 months ago. He is diabetic and his PCP has been following him for the wounds but due to weather he has been unable to see his PCP or check his blood sugar this week and he feels like the wounds are getting worse

## 2013-08-30 NOTE — ED Provider Notes (Signed)
CSN: [id number]     Arrival date & time 08/30/13  1713 History   First MD Initiated Contact with Patient 08/30/13 1852     Chief Complaint  Patient presents with  . Wound Infection     (Consider location/radiation/quality/duration/timing/severity/associated sxs/prior Treatment) HPI  Derek Calderon is a 63 y.o. male who is here for evaluation of sores on his feet. He feels that they are related to trimming a callus. Been out of his insulin for one week. He does not check his glucose, at home. He denies fever, chills, nausea, vomiting, weakness, or dizziness. He sees his endocrinologist regularly for evaluation and treatment of his insulin-dependent diabetes. Has not been receiving wound care for his foot sores. He has had complications from foot sores for several months. He has not been on antibiotics recently. He is vague about why he is not taking his insulin. He is taking his other medicines as prescribed. There are no other known modifying factors.   Past Medical History  Diagnosis Date  . Lymphocytosis   . Mediastinal lymphadenopathy   . Diabetes mellitus   . Hyperlipidemia   . HTN (hypertension)   . Asthma   . Depression   . Fracture of fibula, distal, right, closed 02/01/2012  . Hypothyroidism   . Arthritis    Past Surgical History  Procedure Laterality Date  . Carpal tunnel release  08-2009    right  . Tonsillectomy    . Eye surgery  2005    both cataracts  . Right leg surgery       plate at ankle   . Shoulder open rotator cuff repair Right 01/08/2013    Procedure: ROTATOR CUFF REPAIR SHOULDER OPEN;  Surgeon: Tobi Bastos, MD;  Location: [facility];  Service: Orthopedics;  Laterality: Right;  With Patch Graft   Family History  Problem Relation Age of Onset  . Allergies Sister   . Cancer Mother     stomach  . Pancreatic cancer Father    History  Substance Use Topics  . Smoking status: Current Every Day Smoker -- 0.50 packs/day for 6 years    Types: Cigarettes    . Smokeless tobacco: Never Used  . Alcohol Use: No     Comment: quit in 2005    Review of Systems  All other systems reviewed and are negative.      Allergies  Eszopiclone and Neosporin  Home Medications   Current Outpatient Rx  Name  Route  Sig  Dispense  Refill  . albuterol (PROVENTIL HFA;VENTOLIN HFA) 108 (90 BASE) MCG/ACT inhaler   Inhalation   Inhale 2 puffs into the lungs every 6 (six) hours as needed for wheezing or shortness of breath.          Marland Kitchen atorvastatin (LIPITOR) 10 MG tablet   Oral   Take 10 mg by mouth every morning.          . Dapagliflozin Propanediol (FARXIGA) 10 MG TABS      Take 1 tablet daily   30 tablet   1   . gabapentin (NEURONTIN) 100 MG capsule   Oral   Take 1 capsule (100 mg total) by mouth 3 (three) times daily.   90 capsule   5   . insulin aspart (NOVOLOG) 100 UNIT/ML injection   Subcutaneous   Inject 30-35 Units into the skin 3 (three) times daily before meals. Per sliding scale If 250 - patient states will takes 25 units if 300 will take 30 units         .  insulin glargine (LANTUS) 100 UNIT/ML injection   Subcutaneous   Inject 40-50 Units into the skin at bedtime. Sliding scale         . levothyroxine (SYNTHROID, LEVOTHROID) 50 MCG tablet   Oral   Take 50 mcg by mouth every morning.          . metFORMIN (GLUCOPHAGE) 1000 MG tablet   Oral   Take 1,000 mg by mouth 2 (two) times daily with a meal.          . metoprolol (LOPRESSOR) 50 MG tablet   Oral   Take 1 tablet (50 mg total) by mouth 2 (two) times daily.   60 tablet   5   . naproxen sodium (ANAPROX) 220 MG tablet   Oral   Take 440 mg by mouth daily as needed (for pain).         Marland Kitchen PARoxetine (PAXIL) 40 MG tablet   Oral   Take 40 mg by mouth at bedtime.          Marland Kitchen testosterone cypionate (DEPO-TESTOSTERONE) 200 MG/ML injection   Intramuscular   Inject 1.5 mLs (300 mg total) into the muscle every 14 (fourteen) days.   10 mL   3   . traMADol  (ULTRAM) 50 MG tablet   Oral   Take 50 mg by mouth every 8 (eight) hours as needed for moderate pain.         . clindamycin (CLEOCIN) 300 MG capsule   Oral   Take 1 capsule (300 mg total) by mouth 4 (four) times daily. X 7 days   28 capsule   0   . oxyCODONE-acetaminophen (PERCOCET) 5-325 MG per tablet   Oral   Take 1 tablet by mouth every 4 (four) hours as needed for severe pain.   20 tablet   0    BP 121/71  Pulse 72  Temp(Src) 98.1 F (36.7 C) (Oral)  Resp 20  SpO2 94% Physical Exam  Nursing note and vitals reviewed. Constitutional: He is oriented to person, place, and time. He appears well-developed and well-nourished.  HENT:  Head: Normocephalic and atraumatic.  Right Ear: External ear normal.  Left Ear: External ear normal.  Eyes: Conjunctivae and EOM are normal. Pupils are equal, round, and reactive to light.  Neck: Normal range of motion and phonation normal. Neck supple.  Cardiovascular: Normal rate, regular rhythm, normal heart sounds and intact distal pulses.   Pulmonary/Chest: Effort normal and breath sounds normal. He exhibits no bony tenderness.  Abdominal: Soft. Normal appearance. There is no tenderness.  Musculoskeletal: Normal range of motion.  There is an open ulceration of the right plantar great toe, approximately 2 cm in diameter, and draining a small amount of serous discharge. There is mild associated tenderness and minimal swelling of the right forefoot. There is no proximal streaking of the right leg. The left foot has a small irregular open area over the ball of the foot with mild associated swelling and erythema. Laterally, over the fifth left MTP; there is a 0.5 cm ulceration with an area of callus, but no associated drainage or erythema. See attached photograph.  No proximal streaking of the left leg.  Neurological: He is alert and oriented to person, place, and time. No cranial nerve deficit or sensory deficit. He exhibits normal muscle tone.  Coordination normal.  Skin: Skin is warm, dry and intact.  Psychiatric: He has a normal mood and affect. His behavior is normal. Judgment and thought content normal.  ED Course  Procedures (including critical care time) Medications  piperacillin-tazobactam (ZOSYN) IVPB 3.375 g (0 g Intravenous Stopped 08/30/13 2017)  vancomycin (VANCOCIN) IVPB 1000 mg/200 mL premix (0 mg Intravenous Stopped 08/30/13 2200)  HYDROmorphone (DILAUDID) injection 1 mg (1 mg Intravenous Given 08/30/13 1922)  ondansetron (ZOFRAN) injection 4 mg (4 mg Intravenous Given 08/30/13 1922)  sodium chloride 0.9 % bolus 2,000 mL (0 mLs Intravenous Stopped 08/30/13 2200)  insulin aspart (novoLOG) injection 20 Units (20 Units Subcutaneous Given 08/30/13 2028)  insulin glargine (LANTUS) injection 35 Units (35 Units Subcutaneous Given 08/30/13 2103)    No data found.   7:31 PM Reevaluation with update and discussion. After initial assessment and treatment, an updated evaluation reveals he is feeling better. [place] Review Labs Reviewed  CBC - Abnormal; Notable for the following:    WBC 12.5 (*)    Hemoglobin 17.4 (*)    MCHC 36.8 (*)    All other components within normal limits  COMPREHENSIVE METABOLIC PANEL - Abnormal; Notable for the following:    Sodium 133 (*)    Chloride 93 (*)    Glucose, Bld 430 (*)    All other components within normal limits  CBG MONITORING, ED - Abnormal; Notable for the following:    Glucose-Capillary 391 (*)    All other components within normal limits  CBG MONITORING, ED - Abnormal; Notable for the following:    Glucose-Capillary 415 (*)    All other components within normal limits  CBG MONITORING, ED - Abnormal; Notable for the following:    Glucose-Capillary 379 (*)    All other components within normal limits     MDM   Final diagnoses:  Hyperglycemia  H/O medication noncompliance  Toe ulcer  Foot ulcer    Bilateral foot infection, with hyperglycemia,  secondary to noncompliance and no ketosis. The anion gap is 12.WBC is mildly elevated. He has not had systemic symptoms of infection. He, states that he will have access to his insulin beginning tomorrow. There is no evidence of impending vascular collapse, significant metabolic instability, or systemic infection.  Nursing Notes Reviewed/ Care Coordinated, and agree without changes. Applicable Imaging Reviewed.  Interpretation of Laboratory Data incorporated into ED treatment  Care to coming provider team after completion of IV fluid treatment  Richarda Blade, MD 09/01/13 2034

## 2013-08-30 NOTE — ED Notes (Signed)
PT comfortable with d/c and f/u instructions. Prescriptions x2 

## 2013-08-30 NOTE — ED Notes (Signed)
Carb Modified Dinner tray ordered for patient.

## 2013-08-30 NOTE — Discharge Instructions (Signed)
Diabetes and Foot Care Diabetes may cause you to have problems because of poor blood supply (circulation) to your feet and legs. This may cause the skin on your feet to become thinner, break easier, and heal more slowly. Your skin may become dry, and the skin may peel and crack. You may also have nerve damage in your legs and feet causing decreased feeling in them. You may not notice minor injuries to your feet that could lead to infections or more serious problems. Taking care of your feet is one of the most important things you can do for yourself.  HOME CARE INSTRUCTIONS  Wear shoes at all times, even in the house. Do not go barefoot. Bare feet are easily injured.  Check your feet daily for blisters, cuts, and redness. If you cannot see the bottom of your feet, use a mirror or ask someone for help.  Wash your feet with warm water (do not use hot water) and mild soap. Then pat your feet and the areas between your toes until they are completely dry. Do not soak your feet as this can dry your skin.  Apply a moisturizing lotion or petroleum jelly (that does not contain alcohol and is unscented) to the skin on your feet and to dry, brittle toenails. Do not apply lotion between your toes.  Trim your toenails straight across. Do not dig under them or around the cuticle. File the edges of your nails with an emery board or nail file.  Do not cut corns or calluses or try to remove them with medicine.  Wear clean socks or stockings every day. Make sure they are not too tight. Do not wear knee-high stockings since they may decrease blood flow to your legs.  Wear shoes that fit properly and have enough cushioning. To break in new shoes, wear them for just a few hours a day. This prevents you from injuring your feet. Always look in your shoes before you put them on to be sure there are no objects inside.  Do not cross your legs. This may decrease the blood flow to your feet.  If you find a minor scrape,  cut, or break in the skin on your feet, keep it and the skin around it clean and dry. These areas may be cleansed with mild soap and water. Do not cleanse the area with peroxide, alcohol, or iodine.  When you remove an adhesive bandage, be sure not to damage the skin around it.  If you have a wound, look at it several times a day to make sure it is healing.  Do not use heating pads or hot water bottles. They may burn your skin. If you have lost feeling in your feet or legs, you may not know it is happening until it is too late.  Make sure your health care provider performs a complete foot exam at least annually or more often if you have foot problems. Report any cuts, sores, or bruises to your health care provider immediately. SEEK MEDICAL CARE IF:   You have an injury that is not healing.  You have cuts or breaks in the skin.  You have an ingrown nail.  You notice redness on your legs or feet.  You feel burning or tingling in your legs or feet.  You have pain or cramps in your legs and feet.  Your legs or feet are numb.  Your feet always feel cold. SEEK IMMEDIATE MEDICAL CARE IF:   There is increasing redness,  swelling, or pain in or around a wound.  There is a red line that goes up your leg.  Pus is coming from a wound.  You develop a fever or as directed by your health care provider.  You notice a bad smell coming from an ulcer or wound. Document Released: 06/15/2000 Document Revised: 02/18/2013 Document Reviewed: 11/25/2012 [facility] Patient Information 2014 [place].  High Blood Sugar High blood sugar (hyperglycemia) means that the level of sugar in your blood is higher than it should be. Signs of high blood sugar include:  Feeling thirsty.  Frequent peeing (urinating).  Feeling tired or sleepy.  Dry mouth.  Vision changes.  Feeling weak.  Feeling hungry but losing weight.  Numbness and tingling in your hands or feet.  Headache. When you ignore  these signs, your blood sugar may keep going up. These problems may get worse, and other problems may begin. HOME CARE  Check your blood sugars as told by your doctor. Write down the numbers with the date and time.  Take the right amount of insulin or diabetes pills at the right time. Write down the dose with date and time.  Refill your insulin or diabetes pills before running out.  Watch what you eat. Follow your meal plan.  Drink liquids without sugar, such as water. Check with your doctor if you have kidney or heart disease.  Follow your doctor's orders for exercise. Exercise at the same time of day.  Keep your doctor's appointments. GET HELP RIGHT AWAY IF:   You have trouble thinking or are confused.  You have fast breathing with fruity smelling breath.  You pass out (faint).  You have 2 to 3 days of high blood sugars and you do not know why.  You have chest pain.  You are feeling sick to your stomach (nauseous) or throwing up (vomiting).  You have sudden vision changes. MAKE SURE YOU:   Understand these instructions.  Will watch your condition.  Will get help right away if you are not doing well or get worse. Document Released: 04/15/2009 Document Revised: 09/10/2011 Document Reviewed: 04/15/2009 [facility] Patient Information 2014 [place], [place].

## 2013-08-30 NOTE — ED Notes (Signed)
Checked CBG [id number] RN Raquel Sarna notified

## 2013-08-30 NOTE — ED Notes (Signed)
Pt reports has been using Hydrogen Peroxide to clean feet 2x/day.

## 2013-09-01 NOTE — ED Provider Notes (Signed)
Medical screening examination/treatment/procedure(s) were performed by non-physician practitioner and as supervising physician I was immediately available for consultation/collaboration.  Richarda Blade, MD 09/01/13 2035

## 2013-09-10 ENCOUNTER — Other Ambulatory Visit (INDEPENDENT_AMBULATORY_CARE_PROVIDER_SITE_OTHER): Payer: Medicare Other

## 2013-09-10 ENCOUNTER — Ambulatory Visit: Payer: Medicare Other

## 2013-09-10 ENCOUNTER — Other Ambulatory Visit: Payer: Self-pay | Admitting: Endocrinology

## 2013-09-10 DIAGNOSIS — E1149 Type 2 diabetes mellitus with other diabetic neurological complication: Secondary | ICD-10-CM

## 2013-09-10 DIAGNOSIS — E039 Hypothyroidism, unspecified: Secondary | ICD-10-CM

## 2013-09-10 LAB — COMPREHENSIVE METABOLIC PANEL
ALT: 16 U/L (ref 0–53)
AST: 14 U/L (ref 0–37)
Albumin: 4 g/dL (ref 3.5–5.2)
Alkaline Phosphatase: 88 U/L (ref 39–117)
BUN: 16 mg/dL (ref 6–23)
CALCIUM: 9.6 mg/dL (ref 8.4–10.5)
CHLORIDE: 95 meq/L — AB (ref 96–112)
CO2: 32 meq/L (ref 19–32)
CREATININE: 0.9 mg/dL (ref 0.4–1.5)
GFR: 97.62 mL/min (ref >60.00)
Glucose, Bld: 409 mg/dL — ABNORMAL HIGH (ref 70–99)
Potassium: 4.9 mEq/L (ref 3.5–5.1)
Sodium: 133 mEq/L — ABNORMAL LOW (ref 135–145)
TOTAL PROTEIN: 7.2 g/dL (ref 6.0–8.3)
Total Bilirubin: 0.9 mg/dL (ref 0.3–1.2)

## 2013-09-10 LAB — HEMOGLOBIN A1C: Hgb A1c MFr Bld: 13.4 % — ABNORMAL HIGH (ref 4.6–6.5)

## 2013-09-10 LAB — TSH: TSH: 1.07 u[IU]/mL (ref 0.35–5.50)

## 2013-09-14 ENCOUNTER — Encounter: Payer: Self-pay | Admitting: Endocrinology

## 2013-09-14 ENCOUNTER — Telehealth: Payer: Self-pay | Admitting: Endocrinology

## 2013-09-14 ENCOUNTER — Ambulatory Visit (INDEPENDENT_AMBULATORY_CARE_PROVIDER_SITE_OTHER): Payer: Medicare Other | Admitting: Endocrinology

## 2013-09-14 ENCOUNTER — Other Ambulatory Visit: Payer: Self-pay | Admitting: *Deleted

## 2013-09-14 VITALS — BP 118/72 | HR 88 | Temp 98.5°F | Resp 16 | Ht 70.0 in | Wt 244.0 lb

## 2013-09-14 DIAGNOSIS — E230 Hypopituitarism: Secondary | ICD-10-CM

## 2013-09-14 DIAGNOSIS — E1165 Type 2 diabetes mellitus with hyperglycemia: Principal | ICD-10-CM

## 2013-09-14 DIAGNOSIS — E11621 Type 2 diabetes mellitus with foot ulcer: Secondary | ICD-10-CM

## 2013-09-14 DIAGNOSIS — E1169 Type 2 diabetes mellitus with other specified complication: Secondary | ICD-10-CM

## 2013-09-14 DIAGNOSIS — E236 Other disorders of pituitary gland: Secondary | ICD-10-CM

## 2013-09-14 DIAGNOSIS — L97509 Non-pressure chronic ulcer of other part of unspecified foot with unspecified severity: Secondary | ICD-10-CM

## 2013-09-14 DIAGNOSIS — IMO0001 Reserved for inherently not codable concepts without codable children: Secondary | ICD-10-CM

## 2013-09-14 DIAGNOSIS — E785 Hyperlipidemia, unspecified: Secondary | ICD-10-CM

## 2013-09-14 MED ORDER — DOXYCYCLINE HYCLATE 100 MG PO TABS: 100.0000 mg | ORAL_TABLET | Freq: Two times a day (BID) | ORAL | Status: DC
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
## 2013-09-14 NOTE — Patient Instructions (Signed)
Novolin N 20 in am and 40 at bedtime If am sugar high then 45 at bedtime  Regular 25-30 units; 30 min before meals  Check sugar 3x daily

## 2013-09-14 NOTE — Telephone Encounter (Signed)
rx sent, patient is aware 

## 2013-09-14 NOTE — Telephone Encounter (Signed)
Please see below, do you want that sent to his pharmacy?

## 2013-09-14 NOTE — Telephone Encounter (Signed)
Would like to change this to doxycycline 100 mg twice a day for 5 days

## 2013-09-14 NOTE — Telephone Encounter (Signed)
The med he is on is clindamycin 300 mg 1 capsule 3 times daily call into [facility] on [address]

## 2013-09-15 ENCOUNTER — Telehealth: Payer: Self-pay | Admitting: Endocrinology

## 2013-09-15 ENCOUNTER — Other Ambulatory Visit: Payer: Self-pay | Admitting: *Deleted

## 2013-09-15 MED ORDER — METFORMIN HCL 1000 MG PO TABS: 1000.0000 mg | ORAL_TABLET | Freq: Two times a day (BID) | ORAL | Status: DC

## 2013-09-15 NOTE — Telephone Encounter (Signed)
Pt states the pharmacy did not approve his diabetic medication  Please advise   Thank You :)

## 2013-09-22 ENCOUNTER — Emergency Department (HOSPITAL_COMMUNITY): Payer: Medicare Other

## 2013-09-22 ENCOUNTER — Encounter (HOSPITAL_COMMUNITY): Payer: Self-pay | Admitting: Emergency Medicine

## 2013-09-22 ENCOUNTER — Emergency Department (HOSPITAL_COMMUNITY)
Admission: EM | Admit: 2013-09-22 | Discharge: 2013-09-22 | Disposition: A | Discharge status: Home / Self Care | Payer: Medicare Other | Attending: Emergency Medicine | Admitting: Emergency Medicine

## 2013-09-22 DIAGNOSIS — M129 Arthropathy, unspecified: Secondary | ICD-10-CM | POA: Insufficient documentation

## 2013-09-22 DIAGNOSIS — Z862 Personal history of diseases of the blood and blood-forming organs and certain disorders involving the immune mechanism: Secondary | ICD-10-CM | POA: Insufficient documentation

## 2013-09-22 DIAGNOSIS — M5430 Sciatica, unspecified side: Secondary | ICD-10-CM | POA: Insufficient documentation

## 2013-09-22 DIAGNOSIS — Z8781 Personal history of (healed) traumatic fracture: Secondary | ICD-10-CM | POA: Insufficient documentation

## 2013-09-22 DIAGNOSIS — E119 Type 2 diabetes mellitus without complications: Secondary | ICD-10-CM | POA: Insufficient documentation

## 2013-09-22 DIAGNOSIS — M25552 Pain in left hip: Secondary | ICD-10-CM

## 2013-09-22 DIAGNOSIS — F17200 Nicotine dependence, unspecified, uncomplicated: Secondary | ICD-10-CM | POA: Insufficient documentation

## 2013-09-22 DIAGNOSIS — E039 Hypothyroidism, unspecified: Secondary | ICD-10-CM | POA: Insufficient documentation

## 2013-09-22 DIAGNOSIS — I10 Essential (primary) hypertension: Secondary | ICD-10-CM | POA: Insufficient documentation

## 2013-09-22 DIAGNOSIS — J45909 Unspecified asthma, uncomplicated: Secondary | ICD-10-CM | POA: Insufficient documentation

## 2013-09-22 DIAGNOSIS — E785 Hyperlipidemia, unspecified: Secondary | ICD-10-CM | POA: Insufficient documentation

## 2013-09-22 DIAGNOSIS — F329 Major depressive disorder, single episode, unspecified: Secondary | ICD-10-CM | POA: Insufficient documentation

## 2013-09-22 DIAGNOSIS — F3289 Other specified depressive episodes: Secondary | ICD-10-CM | POA: Insufficient documentation

## 2013-09-22 DIAGNOSIS — Z9889 Other specified postprocedural states: Secondary | ICD-10-CM | POA: Insufficient documentation

## 2013-09-22 DIAGNOSIS — Z79899 Other long term (current) drug therapy: Secondary | ICD-10-CM | POA: Insufficient documentation

## 2013-09-22 DIAGNOSIS — Z791 Long term (current) use of non-steroidal anti-inflammatories (NSAID): Secondary | ICD-10-CM | POA: Insufficient documentation

## 2013-09-22 DIAGNOSIS — Z794 Long term (current) use of insulin: Secondary | ICD-10-CM | POA: Insufficient documentation

## 2013-09-22 MED ORDER — KETOROLAC TROMETHAMINE 60 MG/2ML IM SOLN
30.0000 mg | Freq: Once | INTRAMUSCULAR | Status: AC
  Administered 2013-09-22: 30 mg via INTRAMUSCULAR
  Filled 2013-09-22: qty 2

## 2013-09-22 MED ORDER — HYDROCODONE-ACETAMINOPHEN 5-325 MG PO TABS
2.0000 | ORAL_TABLET | Freq: Once | ORAL | Status: AC
  Administered 2013-09-22: 2 via ORAL
  Filled 2013-09-22: qty 2

## 2013-09-22 MED ORDER — NAPROXEN 500 MG PO TABS: 500.0000 mg | ORAL_TABLET | Freq: Two times a day (BID) | ORAL | Status: DC

## 2013-09-22 MED ORDER — HYDROCODONE-ACETAMINOPHEN 5-325 MG PO TABS: 1.0000 | ORAL_TABLET | ORAL | Status: DC | PRN

## 2013-09-22 NOTE — ED Notes (Signed)
Pt found going through staff drawers in patient's treatment room. States he was just being nosy but did not take anything.

## 2013-09-22 NOTE — ED Notes (Signed)
Per pt sts that he fell back when there was ice and since has been having worsening left lower back pain radiating down left leg.

## 2013-09-22 NOTE — Discharge Instructions (Signed)
Follow up with your doctor in 2 days for further evaluation. Take medications as directed. Do not drive with prescription pain medication,.    Sciatica Sciatica is pain, weakness, numbness, or tingling along the path of the sciatic nerve. The nerve starts in the lower back and runs down the back of each leg. The nerve controls the muscles in the lower leg and in the back of the knee, while also providing sensation to the back of the thigh, lower leg, and the sole of your foot. Sciatica is a symptom of another medical condition. For instance, nerve damage or certain conditions, such as a herniated disk or bone spur on the spine, pinch or put pressure on the sciatic nerve. This causes the pain, weakness, or other sensations normally associated with sciatica. Generally, sciatica only affects one side of the body. CAUSES   Herniated or slipped disc.  Degenerative disk disease.  A pain disorder involving the narrow muscle in the buttocks (piriformis syndrome).  Pelvic injury or fracture.  Pregnancy.  Tumor (rare). SYMPTOMS  Symptoms can vary from mild to very severe. The symptoms usually travel from the low back to the buttocks and down the back of the leg. Symptoms can include:  Mild tingling or dull aches in the lower back, leg, or hip.  Numbness in the back of the calf or sole of the foot.  Burning sensations in the lower back, leg, or hip.  Sharp pains in the lower back, leg, or hip.  Leg weakness.  Severe back pain inhibiting movement. These symptoms may get worse with coughing, sneezing, laughing, or prolonged sitting or standing. Also, being overweight may worsen symptoms. DIAGNOSIS  Your caregiver will perform a physical exam to look for common symptoms of sciatica. He or she may ask you to do certain movements or activities that would trigger sciatic nerve pain. Other tests may be performed to find the cause of the sciatica. These may include:  Blood  tests.  X-rays.  Imaging tests, such as an MRI or CT scan. TREATMENT  Treatment is directed at the cause of the sciatic pain. Sometimes, treatment is not necessary and the pain and discomfort goes away on its own. If treatment is needed, your caregiver may suggest:  Over-the-counter medicines to relieve pain.  Prescription medicines, such as anti-inflammatory medicine, muscle relaxants, or narcotics.  Applying heat or ice to the painful area.  Steroid injections to lessen pain, irritation, and inflammation around the nerve.  Reducing activity during periods of pain.  Exercising and stretching to strengthen your abdomen and improve flexibility of your spine. Your caregiver may suggest losing weight if the extra weight makes the back pain worse.  Physical therapy.  Surgery to eliminate what is pressing or pinching the nerve, such as a bone spur or part of a herniated disk. HOME CARE INSTRUCTIONS   Only take over-the-counter or prescription medicines for pain or discomfort as directed by your caregiver.  Apply ice to the affected area for 20 minutes, 3 4 times a day for the first 48 72 hours. Then try heat in the same way.  Exercise, stretch, or perform your usual activities if these do not aggravate your pain.  Attend physical therapy sessions as directed by your caregiver.  Keep all follow-up appointments as directed by your caregiver.  Do not wear high heels or shoes that do not provide proper support.  Check your mattress to see if it is too soft. A firm mattress may lessen your pain and discomfort.  SEEK IMMEDIATE MEDICAL CARE IF:   You lose control of your bowel or bladder (incontinence).  You have increasing weakness in the lower back, pelvis, buttocks, or legs.  You have redness or swelling of your back.  You have a burning sensation when you urinate.  You have pain that gets worse when you lie down or awakens you at night.  Your pain is worse than you have  experienced in the past.  Your pain is lasting longer than 4 weeks.  You are suddenly losing weight without reason. MAKE SURE YOU:  Understand these instructions.  Will watch your condition.  Will get help right away if you are not doing well or get worse. Document Released: 06/12/2001 Document Revised: 12/18/2011 Document Reviewed: 10/28/2011 [facility] Patient Information 2014 [place].

## 2013-09-22 NOTE — ED Notes (Signed)
Patient transported to X-ray 

## 2013-09-22 NOTE — ED Provider Notes (Signed)
CSN: [id number]     Arrival date & time 09/22/13  1753 History  This chart was scribed for non-physician practitioner Maude Leriche, PA-C working with Mariea Clonts, MD by Zettie Pho, ED Scribe. This patient was seen in room TR05C/TR05C and the patient's care was started at 6:25 PM.    Chief Complaint  Patient presents with  . Back Pain   The history is provided by the patient. No language interpreter was used.   HPI Comments: Derek Calderon is a 63 y.o. male who presents to the Emergency Department complaining of an intermittent, shooting/burning pain to the left side of the lower back that he states has been ongoing for the past few months after slipping on ice, but has been progressively worsening. He states that the pain radiates down the entire left leg and is exacerbated with laying down, sitting, and bearing weight. Patient also reports some pain exacerbation with straining to have a BM. He reports some associated weakness to the left leg secondary to pain with intermittent paresthesias to the left foot. Patient states that he has not been evaluated for these complaints. He denies neck pain, dysuria, hematuria. He denies history of back injuries, IV drug use, cancer. Patient has a history of lymphocytosis, mediastinal lymphadenopathy, DM, HTN, hyperlipidemia, hypothyroidism, and arthritis.   Past Medical History  Diagnosis Date  . Lymphocytosis   . Mediastinal lymphadenopathy   . Diabetes mellitus   . Hyperlipidemia   . HTN (hypertension)   . Asthma   . Depression   . Fracture of fibula, distal, right, closed 02/01/2012  . Hypothyroidism   . Arthritis    Past Surgical History  Procedure Laterality Date  . Carpal tunnel release  08-2009    right  . Tonsillectomy    . Eye surgery  2005    both cataracts  . Right leg surgery       plate at ankle   . Shoulder open rotator cuff repair Right 01/08/2013    Procedure: ROTATOR CUFF REPAIR SHOULDER OPEN;  Surgeon: Tobi Bastos,  MD;  Location: [facility];  Service: Orthopedics;  Laterality: Right;  With Patch Graft   Family History  Problem Relation Age of Onset  . Allergies Sister   . Cancer Mother     stomach  . Pancreatic cancer Father    History  Substance Use Topics  . Smoking status: Current Every Day Smoker -- 0.50 packs/day for 6 years    Types: Cigarettes  . Smokeless tobacco: Never Used  . Alcohol Use: No     Comment: quit in 2005    Review of Systems  A complete 10 system review of systems was obtained and all systems are negative except as noted in the HPI and PMH.    Allergies  Eszopiclone and Neosporin  Home Medications   Current Outpatient Rx  Name  Route  Sig  Dispense  Refill  . albuterol (PROVENTIL HFA;VENTOLIN HFA) 108 (90 BASE) MCG/ACT inhaler   Inhalation   Inhale 2 puffs into the lungs every 6 (six) hours as needed for wheezing or shortness of breath.          Marland Kitchen atorvastatin (LIPITOR) 10 MG tablet   Oral   Take 10 mg by mouth every morning.          . B-D INS SYRINGE 0.5CC/31GX5/16 31G X 5/16" 0.5 ML MISC               . gabapentin (NEURONTIN) 100 MG  capsule   Oral   Take 1 capsule (100 mg total) by mouth 3 (three) times daily.   90 capsule   5   . insulin aspart (NOVOLOG) 100 UNIT/ML injection   Subcutaneous   Inject 30-35 Units into the skin 3 (three) times daily before meals. Per sliding scale If 250 - patient states will takes 25 units if 300 will take 30 units         . insulin glargine (LANTUS) 100 UNIT/ML injection   Subcutaneous   Inject 40-50 Units into the skin at bedtime. Sliding scale         . levothyroxine (SYNTHROID, LEVOTHROID) 50 MCG tablet   Oral   Take 50 mcg by mouth every morning.          . metFORMIN (GLUCOPHAGE) 1000 MG tablet   Oral   Take 1 tablet (1,000 mg total) by mouth 2 (two) times daily with a meal.   60 tablet   3   . metoprolol (LOPRESSOR) 50 MG tablet   Oral   Take 1 tablet (50 mg total) by mouth 2 (two) times  daily.   60 tablet   5   . naproxen sodium (ANAPROX) 220 MG tablet   Oral   Take 440 mg by mouth daily as needed (for pain).         Marland Kitchen PARoxetine (PAXIL) 40 MG tablet   Oral   Take 40 mg by mouth at bedtime.          Marland Kitchen testosterone cypionate (DEPOTESTOTERONE CYPIONATE) 200 MG/ML injection      INJCET 1.5MLS INTO THE MUSCLE EVERY 14 DAYS   10 mL   1   . traMADol (ULTRAM) 50 MG tablet   Oral   Take 50 mg by mouth every 8 (eight) hours as needed for moderate pain.         Marland Kitchen doxycycline (VIBRA-TABS) 100 MG tablet   Oral   Take 100 mg by mouth 2 (two) times daily. Completed course of medication today (09-22-13)         . HYDROcodone-acetaminophen (NORCO/VICODIN) 5-325 MG per tablet   Oral   Take 1-2 tablets by mouth every 4 (four) hours as needed.   6 tablet   0   . naproxen (NAPROSYN) 500 MG tablet   Oral   Take 1 tablet (500 mg total) by mouth 2 (two) times daily.   15 tablet   0    Triage Vitals: BP 155/80  Pulse 84  Temp(Src) 97.6 F (36.4 C) (Oral)  Resp 18  Ht 6\' 2"  (1.88 m)  Wt 244 lb (110.678 kg)  BMI 31.31 kg/m2  SpO2 97%  Physical Exam  Nursing note and vitals reviewed. Constitutional: He is oriented to person, place, and time. He appears well-developed and well-nourished. No distress.  HENT:  Head: Normocephalic and atraumatic.  Eyes: Conjunctivae are normal.  Neck: No tracheal deviation present.  Pulmonary/Chest: Effort normal. No respiratory distress.  Musculoskeletal: Normal range of motion. He exhibits no edema.  Left lower lumbar paraspinal tenderness to palpation. No midline tenderness. Negative straight leg raise bilaterally. Pain illicited with compression of sciatic notch. Patient able to ambulate and bear weight in room without assistance.   Neurological: He is alert and oriented to person, place, and time. He has normal reflexes. He displays normal reflexes.  Reflex Scores:      Brachioradialis reflexes are 2+ on the right side and  2+ on the left side.  Patellar reflexes are 2+ on the right side and 2+ on the left side. Normal strength and sensation of bilateral upper and lower extremities.   Skin: Skin is warm and dry. He is not diaphoretic.  Psychiatric: He has a normal mood and affect. His behavior is normal.    ED Course  Procedures (including critical care time)  DIAGNOSTIC STUDIES: Oxygen Saturation is 97% on room air, normal by my interpretation.    COORDINATION OF CARE: 6:32 PM-Will order x-rays of the left hip. Will order pain medication to manage symptoms. Discussed treatment plan with patient at bedside and patient verbalized agreement.   7:45 PM- Discussed that x-ray results were negative. Patient reports that his pain has significantly improved after receiving Toradol and hydrocodone. Will discharge patient with Naproxen and hydrocodone. Discussed treatment plan with patient at bedside and patient verbalized agreement.   8:03 PM- Reviewed [place] CSRS, which indicated that the patient has been receiving multiple prescriptions for different narcotics, approximately twice a month.   Labs Review Labs Reviewed - No data to display  Imaging Review Dg Hip Complete Left  09/22/2013   CLINICAL DATA:  Left hip pain  EXAM: LEFT HIP - COMPLETE 2+ VIEW  COMPARISON:  None.  FINDINGS: No acute fracture or dislocation identified. The left femoral head is in normal alignment within the acetabulum. Femoral head height is preserved. Femoral neck is intact.  Degenerative joint space narrowing with subchondral sclerosis and juxta-articular osteophytosis seen about the left hip, compatible with moderate degenerative osteoarthrosis. Similar changes are seen about the right hip.  Visualized pelvis is within normal limits.  SI joints are normal.  No soft tissue abnormality.  IMPRESSION: 1. No acute fracture or dislocation. 2. 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   Electronically Signed   By: Jeannine Boga M.D.   On: 09/22/2013 19:00     EKG Interpretation None      MDM   Final diagnoses:  Left hip pain  Sciatica   Plain films negative for acute abnormalities but show osteoarthrosis of bilateral hips.  No evidence of septic joint, gout, or hemarthrosis on exam or plain films. Plan to treat patient's pain and have him follow up with his PCP in 2 days for further evaluation. Patient agrees with plan. Discharged in good condition.   Meds given in ED:  Medications  HYDROcodone-acetaminophen (NORCO/VICODIN) 5-325 MG per tablet 2 tablet (2 tablets Oral Given 09/22/13 1834)  ketorolac (TORADOL) injection 30 mg (30 mg Intramuscular Given 09/22/13 1834)    Discharge Medication List as of 09/22/2013  7:55 PM    START taking these medications   Details  HYDROcodone-acetaminophen (NORCO/VICODIN) 5-325 MG per tablet Take 1-2 tablets by mouth every 4 (four) hours as needed., Starting 09/22/2013, Until Discontinued, Print    naproxen (NAPROSYN) 500 MG tablet Take 1 tablet (500 mg total) by mouth 2 (two) times daily., Starting 09/22/2013, Until Discontinued, Print        I personally performed the services described in this documentation, which was scribed in my presence. The recorded information has been reviewed and is accurate.     Sherrie George, PA-C 09/23/13 2213

## 2013-09-24 NOTE — ED Provider Notes (Signed)
Medical screening examination/treatment/procedure(s) were performed by non-physician practitioner and as supervising physician I was immediately available for consultation/collaboration.   EKG Interpretation None        Ty Oshima M Truett Mcfarlan, MD 09/24/13 1231 

## 2013-10-05 ENCOUNTER — Ambulatory Visit: Payer: Medicare Other | Admitting: Endocrinology

## 2013-10-16 ENCOUNTER — Other Ambulatory Visit: Payer: Self-pay | Admitting: *Deleted

## 2013-10-16 ENCOUNTER — Other Ambulatory Visit (INDEPENDENT_AMBULATORY_CARE_PROVIDER_SITE_OTHER): Payer: Medicare Other

## 2013-10-16 DIAGNOSIS — IMO0001 Reserved for inherently not codable concepts without codable children: Secondary | ICD-10-CM

## 2013-10-16 DIAGNOSIS — E1165 Type 2 diabetes mellitus with hyperglycemia: Principal | ICD-10-CM

## 2013-10-16 DIAGNOSIS — E230 Hypopituitarism: Secondary | ICD-10-CM

## 2013-10-16 DIAGNOSIS — E236 Other disorders of pituitary gland: Secondary | ICD-10-CM

## 2013-10-16 LAB — LIPID PANEL
Cholesterol: 161 mg/dL (ref 0–200)
HDL: 29.8 mg/dL — ABNORMAL LOW (ref >39.00)
LDL CALC: 93 mg/dL (ref 0–99)
TRIGLYCERIDES: 190 mg/dL — AB (ref 0.0–149.0)
Total CHOL/HDL Ratio: 5
VLDL: 38 mg/dL (ref 0.0–40.0)

## 2013-10-16 LAB — COMPREHENSIVE METABOLIC PANEL
ALK PHOS: 69 U/L (ref 39–117)
ALT: 17 U/L (ref 0–53)
AST: 16 U/L (ref 0–37)
Albumin: 3.7 g/dL (ref 3.5–5.2)
BUN: 13 mg/dL (ref 6–23)
CO2: 28 mEq/L (ref 19–32)
CREATININE: 0.7 mg/dL (ref 0.4–1.5)
Calcium: 9.1 mg/dL (ref 8.4–10.5)
Chloride: 99 mEq/L (ref 96–112)
GFR: 124.15 mL/min (ref >60.00)
Glucose, Bld: 198 mg/dL — ABNORMAL HIGH (ref 70–99)
Potassium: 4.2 mEq/L (ref 3.5–5.1)
Sodium: 135 mEq/L (ref 135–145)
Total Bilirubin: 0.7 mg/dL (ref 0.3–1.2)
Total Protein: 6.8 g/dL (ref 6.0–8.3)

## 2013-10-16 LAB — HEMOGLOBIN A1C: Hgb A1c MFr Bld: 10.6 % — ABNORMAL HIGH (ref 4.6–6.5)

## 2013-10-16 LAB — TESTOSTERONE: Testosterone: 145.16 ng/dL — ABNORMAL LOW (ref 350.00–890.00)

## 2013-10-21 ENCOUNTER — Ambulatory Visit: Payer: Medicare Other | Admitting: Endocrinology

## 2013-10-26 ENCOUNTER — Ambulatory Visit (INDEPENDENT_AMBULATORY_CARE_PROVIDER_SITE_OTHER): Payer: Medicare Other | Admitting: Endocrinology

## 2013-10-26 ENCOUNTER — Encounter: Payer: Self-pay | Admitting: Endocrinology

## 2013-10-26 VITALS — BP 146/92 | HR 75 | Temp 98.0°F | Resp 16 | Ht 70.0 in | Wt 243.4 lb

## 2013-10-26 DIAGNOSIS — E785 Hyperlipidemia, unspecified: Secondary | ICD-10-CM

## 2013-10-26 DIAGNOSIS — E1142 Type 2 diabetes mellitus with diabetic polyneuropathy: Secondary | ICD-10-CM

## 2013-10-26 DIAGNOSIS — E1165 Type 2 diabetes mellitus with hyperglycemia: Principal | ICD-10-CM

## 2013-10-26 DIAGNOSIS — E11621 Type 2 diabetes mellitus with foot ulcer: Secondary | ICD-10-CM

## 2013-10-26 DIAGNOSIS — E230 Hypopituitarism: Secondary | ICD-10-CM

## 2013-10-26 DIAGNOSIS — E236 Other disorders of pituitary gland: Secondary | ICD-10-CM

## 2013-10-26 DIAGNOSIS — E1149 Type 2 diabetes mellitus with other diabetic neurological complication: Secondary | ICD-10-CM

## 2013-10-26 DIAGNOSIS — I10 Essential (primary) hypertension: Secondary | ICD-10-CM

## 2013-10-26 DIAGNOSIS — IMO0001 Reserved for inherently not codable concepts without codable children: Secondary | ICD-10-CM

## 2013-10-26 DIAGNOSIS — L97509 Non-pressure chronic ulcer of other part of unspecified foot with unspecified severity: Secondary | ICD-10-CM

## 2013-10-26 MED ORDER — TESTOSTERONE CYPIONATE 200 MG/ML IM SOLN
300.0000 mg | Freq: Once | INTRAMUSCULAR | Status: AC
  Administered 2013-10-26: 300 mg via INTRAMUSCULAR

## 2013-10-26 MED ORDER — VALSARTAN 80 MG PO TABS: 80.0000 mg | ORAL_TABLET | Freq: Every day | ORAL | Status: DC

## 2013-10-26 MED ORDER — HYDROCODONE-ACETAMINOPHEN 5-325 MG PO TABS: 1.0000 | ORAL_TABLET | Freq: Four times a day (QID) | ORAL | Status: DC | PRN

## 2013-10-26 MED ORDER — CANAGLIFLOZIN 300 MG PO TABS: 300.0000 mg | ORAL_TABLET | Freq: Every day | ORAL | Status: DC
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
## 2013-10-26 NOTE — Patient Instructions (Addendum)
Novolin N 35 before lunch and 45 at bedtime and adjust as directed Regular 25-30  Units  before meals based on meal size and glucose  Start valsartan for BP  Schedule with a PCP

## 2013-11-09 ENCOUNTER — Other Ambulatory Visit: Payer: Self-pay | Admitting: *Deleted

## 2013-11-09 ENCOUNTER — Ambulatory Visit: Payer: Medicare Other | Admitting: Endocrinology

## 2013-11-09 ENCOUNTER — Telehealth: Payer: Self-pay | Admitting: *Deleted

## 2013-11-09 DIAGNOSIS — E230 Hypopituitarism: Secondary | ICD-10-CM

## 2013-11-09 MED ORDER — TESTOSTERONE CYPIONATE 200 MG/ML IM SOLN
300.0000 mg | Freq: Once | INTRAMUSCULAR | Status: AC
  Administered 2013-11-09: 300 mg via INTRAMUSCULAR

## 2013-11-09 NOTE — Telephone Encounter (Signed)
We can refer him to a PCP, the prescription was one time only

## 2013-11-09 NOTE — Telephone Encounter (Signed)
Patient wants to know if you will refill his hydrocodone.

## 2013-11-28 ENCOUNTER — Emergency Department (HOSPITAL_COMMUNITY)
Admission: EM | Admit: 2013-11-28 | Discharge: 2013-11-29 | Disposition: A | Discharge status: Home / Self Care | Payer: Medicare Other | Attending: Emergency Medicine | Admitting: Emergency Medicine

## 2013-11-28 ENCOUNTER — Encounter (HOSPITAL_COMMUNITY): Payer: Self-pay | Admitting: Emergency Medicine

## 2013-11-28 DIAGNOSIS — L03319 Cellulitis of trunk, unspecified: Secondary | ICD-10-CM

## 2013-11-28 DIAGNOSIS — L0291 Cutaneous abscess, unspecified: Secondary | ICD-10-CM

## 2013-11-28 DIAGNOSIS — E119 Type 2 diabetes mellitus without complications: Secondary | ICD-10-CM | POA: Insufficient documentation

## 2013-11-28 DIAGNOSIS — Z794 Long term (current) use of insulin: Secondary | ICD-10-CM | POA: Insufficient documentation

## 2013-11-28 DIAGNOSIS — E785 Hyperlipidemia, unspecified: Secondary | ICD-10-CM | POA: Insufficient documentation

## 2013-11-28 DIAGNOSIS — D72820 Lymphocytosis (symptomatic): Secondary | ICD-10-CM | POA: Insufficient documentation

## 2013-11-28 DIAGNOSIS — L0390 Cellulitis, unspecified: Secondary | ICD-10-CM

## 2013-11-28 DIAGNOSIS — F3289 Other specified depressive episodes: Secondary | ICD-10-CM | POA: Insufficient documentation

## 2013-11-28 DIAGNOSIS — R599 Enlarged lymph nodes, unspecified: Secondary | ICD-10-CM | POA: Insufficient documentation

## 2013-11-28 DIAGNOSIS — Z79899 Other long term (current) drug therapy: Secondary | ICD-10-CM | POA: Insufficient documentation

## 2013-11-28 DIAGNOSIS — M129 Arthropathy, unspecified: Secondary | ICD-10-CM | POA: Insufficient documentation

## 2013-11-28 DIAGNOSIS — F17200 Nicotine dependence, unspecified, uncomplicated: Secondary | ICD-10-CM | POA: Insufficient documentation

## 2013-11-28 DIAGNOSIS — Z888 Allergy status to other drugs, medicaments and biological substances status: Secondary | ICD-10-CM | POA: Insufficient documentation

## 2013-11-28 DIAGNOSIS — L03317 Cellulitis of buttock: Principal | ICD-10-CM

## 2013-11-28 DIAGNOSIS — J45909 Unspecified asthma, uncomplicated: Secondary | ICD-10-CM | POA: Insufficient documentation

## 2013-11-28 DIAGNOSIS — L0231 Cutaneous abscess of buttock: Secondary | ICD-10-CM | POA: Insufficient documentation

## 2013-11-28 DIAGNOSIS — E039 Hypothyroidism, unspecified: Secondary | ICD-10-CM | POA: Insufficient documentation

## 2013-11-28 DIAGNOSIS — I10 Essential (primary) hypertension: Secondary | ICD-10-CM | POA: Insufficient documentation

## 2013-11-28 DIAGNOSIS — L02219 Cutaneous abscess of trunk, unspecified: Secondary | ICD-10-CM | POA: Insufficient documentation

## 2013-11-28 DIAGNOSIS — F329 Major depressive disorder, single episode, unspecified: Secondary | ICD-10-CM | POA: Insufficient documentation

## 2013-11-28 MED ORDER — CEPHALEXIN 500 MG PO CAPS: 500.0000 mg | ORAL_CAPSULE | Freq: Four times a day (QID) | ORAL | Status: DC

## 2013-11-28 MED ORDER — CEPHALEXIN 250 MG PO CAPS
500.0000 mg | ORAL_CAPSULE | Freq: Once | ORAL | Status: AC
  Administered 2013-11-28: 500 mg via ORAL
  Filled 2013-11-28: qty 2

## 2013-11-28 MED ORDER — OXYCODONE-ACETAMINOPHEN 5-325 MG PO TABS: 1.0000 | ORAL_TABLET | Freq: Four times a day (QID) | ORAL | Status: DC | PRN

## 2013-11-28 MED ORDER — OXYCODONE-ACETAMINOPHEN 5-325 MG PO TABS
1.0000 | ORAL_TABLET | Freq: Once | ORAL | Status: AC
Start: 2013-11-28 — End: 2013-11-28
  Administered 2013-11-28: 1 via ORAL
  Filled 2013-11-28: qty 1

## 2013-11-28 NOTE — ED Notes (Signed)
Pt presents with an abscess to his Right buttocks x2 days. Pt also states he feels nauseous

## 2013-11-28 NOTE — ED Provider Notes (Signed)
CSN: [id number]     Arrival date & time 11/28/13  2209 History  This chart was scribed for non-physician practitioner Delos Haring working with No att. providers found by Mercy Moore, ED Scribe. This patient was seen in room TR11C/TR11C and the patient's care was started at 10:56 PM.   Chief Complaint  Patient presents with  . Abscess      HPI HPI Comments: Derek Calderon is a 63 y.o. male who presents to the Emergency Department complaining of abscess to right outer thigh, present for two days. Patient reports an abscess to right buttocks that has been present for one month. That abscess he attempted to drain on his own, but states that it worsened and grew in size. Patient reports having sciatica and having to sleep sitting upright, which has only further irritated his abscesses. Patient reports history of abscess in similar location.    Patient verifies that his Diabetes has been well controlled.    Past Medical History  Diagnosis Date  . Lymphocytosis   . Mediastinal lymphadenopathy   . Diabetes mellitus   . Hyperlipidemia   . HTN (hypertension)   . Asthma   . Depression   . Fracture of fibula, distal, right, closed 02/01/2012  . Hypothyroidism   . Arthritis    Past Surgical History  Procedure Laterality Date  . Carpal tunnel release  08-2009    right  . Tonsillectomy    . Eye surgery  2005    both cataracts  . Right leg surgery       plate at ankle   . Shoulder open rotator cuff repair Right 01/08/2013    Procedure: ROTATOR CUFF REPAIR SHOULDER OPEN;  Surgeon: Tobi Bastos, MD;  Location: [facility];  Service: Orthopedics;  Laterality: Right;  With Patch Graft   Family History  Problem Relation Age of Onset  . Allergies Sister   . Cancer Mother     stomach  . Pancreatic cancer Father    History  Substance Use Topics  . Smoking status: Current Every Day Smoker -- 0.50 packs/day for 6 years    Types: Cigarettes  . Smokeless tobacco: Never Used  . Alcohol  Use: No     Comment: quit in 2005    Review of Systems  Constitutional: Negative for fever.  Skin:       Abscess  All other systems reviewed and are negative.     Allergies  Eszopiclone and Neosporin  Home Medications   Prior to Admission medications   Medication Sig Start Date End Date Taking? Authorizing Provider  albuterol (PROVENTIL HFA;VENTOLIN HFA) 108 (90 BASE) MCG/ACT inhaler Inhale 2 puffs into the lungs every 6 (six) hours as needed for wheezing or shortness of breath.     Historical Provider, MD  atorvastatin (LIPITOR) 10 MG tablet Take 10 mg by mouth every morning.     Historical Provider, MD  B-D INS SYRINGE 0.5CC/31GX5/16 31G X 5/16" 0.5 ML MISC  07/30/13   Historical Provider, MD  Canagliflozin (INVOKANA) 300 MG TABS Take 1 tablet (300 mg total) by mouth daily before breakfast. 10/26/13   Elayne Snare, MD  cephALEXin (KEFLEX) 500 MG capsule Take 1 capsule (500 mg total) by mouth 4 (four) times daily. 11/28/13   Linus Mako, PA-C  FARXIGA 10 MG TABS  09/15/13   Historical Provider, MD  gabapentin (NEURONTIN) 100 MG capsule Take 1 capsule (100 mg total) by mouth 3 (three) times daily. 07/27/13   Elayne Snare, MD  HYDROcodone-acetaminophen (NORCO/VICODIN) 5-325 MG per tablet Take 1 tablet by mouth every 6 (six) hours as needed. 10/26/13   Elayne Snare, MD  insulin aspart (NOVOLOG) 100 UNIT/ML injection Inject 30-35 Units into the skin 3 (three) times daily before meals. Per sliding scale If 250 - patient states will takes 25 units if 300 will take 30 units 02/24/13   Elayne Snare, MD  levothyroxine (SYNTHROID, LEVOTHROID) 50 MCG tablet Take 50 mcg by mouth every morning.     Historical Provider, MD  metFORMIN (GLUCOPHAGE) 1000 MG tablet Take 1 tablet (1,000 mg total) by mouth 2 (two) times daily with a meal. 09/15/13   Elayne Snare, MD  metoprolol (LOPRESSOR) 50 MG tablet Take 1 tablet (50 mg total) by mouth 2 (two) times daily. 03/04/13   Elayne Snare, MD  naproxen sodium (ANAPROX) 220  MG tablet Take 440 mg by mouth daily as needed (for pain).    Historical Provider, MD  NOVOLIN N 100 UNIT/ML injection 30-35 Units 2 (two) times daily before a meal.  09/15/13   Historical Provider, MD  oxyCODONE-acetaminophen (PERCOCET/ROXICET) 5-325 MG per tablet Take 1-2 tablets by mouth every 6 (six) hours as needed. 11/28/13   Careen Mauch Marilu Favre, PA-C  PARoxetine (PAXIL) 40 MG tablet Take 40 mg by mouth at bedtime.     Historical Provider, MD  testosterone cypionate (DEPOTESTOTERONE CYPIONATE) 200 MG/ML injection INJCET 1.5MLS INTO THE MUSCLE EVERY 14 DAYS    Elayne Snare, MD  traMADol (ULTRAM) 50 MG tablet Take 50 mg by mouth every 8 (eight) hours as needed for moderate pain. 07/30/13   Elayne Snare, MD  valsartan (DIOVAN) 80 MG tablet Take 1 tablet (80 mg total) by mouth daily. 10/26/13   Elayne Snare, MD   Triage Vitals: BP 120/79  Pulse 100  Temp(Src) 98.1 F (36.7 C) (Oral)  Resp 18  SpO2 96% Physical Exam  Nursing note and vitals reviewed. Constitutional: He is oriented to person, place, and time. He appears well-developed and well-nourished. No distress.  HENT:  Head: Normocephalic and atraumatic.  Eyes: EOM are normal.  Neck: Neck supple. No tracheal deviation present.  Cardiovascular: Normal rate.   Pulmonary/Chest: Effort normal. No respiratory distress.  Musculoskeletal: Normal range of motion.  Neurological: He is alert and oriented to person, place, and time.  Skin: Skin is warm and dry.  1. Abscess to right lower back approx 3 x 1 cm 2. Area of cellulitus and area of developing abscess to right lateral thigh. 2 x 3 cm  Psychiatric: He has a normal mood and affect. His behavior is normal.    ED Course  Procedures (including critical care time)  EMERGENCY DEPARTMENT US SOFT TISSUE INTERPRETATION "Study: Limited Ultrasound of the noted body part in comments below"  INDICATIONS: Soft tissue infection Multiple views of the body part are obtained with a multi-frequency linear  probe  PERFORMED BY:  Myself  IMAGES ARCHIVED?: Yes  SIDE:Left  BODY PART:Lower back  FINDINGS: Abcess present  LIMITATIONS:  none INTERPRETATION:  Abcess present    INCISION AND DRAINAGE PROCEDURE NOTE: Patient identification was confirmed and verbal consent was obtained. This procedure was performed by Delos Haring  at 11:14 PM. Site: right low back Sterile procedures observed yes Needle size: 25 gauge Anesthetic used (type and amt): 1 ml lidocaine with epi Blade size: 18 Drainage: moderate Complexity: Complex Packing used none Site anesthetized, incision made over site, wound drained and explored loculations, rinsed with copious amounts of normal saline, wound packed with sterile  gauze, covered with dry, sterile dressing.  Pt tolerated procedure well without complications.  Instructions for care discussed verbally and pt provided with additional written instructions for homecare and f/u.   DIAGNOSTIC STUDIES: Oxygen Saturation is 96% on room air, adequate by my interpretation.    COORDINATION OF CARE: 11:24 PM- Will prescribe antibiotics and pain medication. Discussed treatment plan with patient at bedside and patient agreed to plan.     Labs Review Labs Reviewed - No data to display  Imaging Review No results found.   EKG Interpretation None      MDM   Final diagnoses:  Abscess  Cellulitis    63 y.o.Cornellius Kropp Koopmann's evaluation in the Emergency Department is complete. It has been determined that no acute conditions requiring further emergency intervention are present at this time. The patient/guardian have been advised of the diagnosis and plan. We have discussed signs and symptoms that warrant return to the ED, such as changes or worsening in symptoms.  Vital signs are stable at discharge. Filed Vitals:   11/28/13 2216  BP: 120/79  Pulse: 100  Temp: 98.1 F (36.7 C)  Resp: 18    Patient/guardian has voiced understanding and agreed to  follow-up with the PCP or specialist.  I personally performed the services described in this documentation, which was scribed in my presence. The recorded information has been reviewed and is accurate.    Linus Mako, PA-C 11/29/13 1553

## 2013-11-28 NOTE — Discharge Instructions (Signed)
Abscess An abscess is an infected area that contains a collection of pus and debris.It can occur in almost any part of the body. An abscess is also known as a furuncle or boil. CAUSES  An abscess occurs when tissue gets infected. This can occur from blockage of oil or sweat glands, infection of hair follicles, or a minor injury to the skin. As the body tries to fight the infection, pus collects in the area and creates pressure under the skin. This pressure causes pain. People with weakened immune systems have difficulty fighting infections and get certain abscesses more often.  SYMPTOMS Usually an abscess develops on the skin and becomes a painful mass that is red, warm, and tender. If the abscess forms under the skin, you may feel a moveable soft area under the skin. Some abscesses break open (rupture) on their own, but most will continue to get worse without care. The infection can spread deeper into the body and eventually into the bloodstream, causing you to feel ill.  DIAGNOSIS  Your caregiver will take your medical history and perform a physical exam. A sample of fluid may also be taken from the abscess to determine what is causing your infection. TREATMENT  Your caregiver may prescribe antibiotic medicines to fight the infection. However, taking antibiotics alone usually does not cure an abscess. Your caregiver may need to make a small cut (incision) in the abscess to drain the pus. In some cases, gauze is packed into the abscess to reduce pain and to continue draining the area. HOME CARE INSTRUCTIONS   Only take over-the-counter or prescription medicines for pain, discomfort, or fever as directed by your caregiver.  If you were prescribed antibiotics, take them as directed. Finish them even if you start to feel better.  If gauze is used, follow your caregiver's directions for changing the gauze.  To avoid spreading the infection:  Keep your draining abscess covered with a  bandage.  Wash your hands well.  Do not share personal care items, towels, or whirlpools with others.  Avoid skin contact with others.  Keep your skin and clothes clean around the abscess.  Keep all follow-up appointments as directed by your caregiver. SEEK MEDICAL CARE IF:   You have increased pain, swelling, redness, fluid drainage, or bleeding.  You have muscle aches, chills, or a general ill feeling.  You have a fever. MAKE SURE YOU:   Understand these instructions.  Will watch your condition.  Will get help right away if you are not doing well or get worse. Document Released: 03/28/2005 Document Revised: 12/18/2011 Document Reviewed: 08/31/2011 [employer] Patient Information 2014 [employer].  

## 2013-11-30 NOTE — ED Provider Notes (Signed)
Medical screening examination/treatment/procedure(s) were performed by non-physician practitioner and as supervising physician I was immediately available for consultation/collaboration.   EKG Interpretation None        Fredia Sorrow, MD 11/30/13 1023

## 2013-12-02 ENCOUNTER — Encounter (INDEPENDENT_AMBULATORY_CARE_PROVIDER_SITE_OTHER): Payer: Medicare Other | Admitting: Endocrinology

## 2013-12-02 DIAGNOSIS — E230 Hypopituitarism: Secondary | ICD-10-CM

## 2013-12-02 DIAGNOSIS — E236 Other disorders of pituitary gland: Secondary | ICD-10-CM

## 2013-12-02 MED ORDER — TESTOSTERONE CYPIONATE 200 MG/ML IM SOLN
300.0000 mg | Freq: Once | INTRAMUSCULAR | Status: AC
  Administered 2013-12-02: 300 mg via INTRAMUSCULAR

## 2013-12-05 ENCOUNTER — Other Ambulatory Visit: Payer: Self-pay | Admitting: Endocrinology

## 2013-12-08 ENCOUNTER — Other Ambulatory Visit: Payer: Self-pay | Admitting: Endocrinology

## 2013-12-14 ENCOUNTER — Ambulatory Visit: Payer: Medicare Other | Admitting: Endocrinology

## 2013-12-14 ENCOUNTER — Other Ambulatory Visit: Payer: Self-pay | Admitting: *Deleted

## 2013-12-14 DIAGNOSIS — E230 Hypopituitarism: Secondary | ICD-10-CM

## 2013-12-14 MED ORDER — TESTOSTERONE CYPIONATE 200 MG/ML IM SOLN
300.0000 mg | Freq: Once | INTRAMUSCULAR | Status: AC
  Administered 2013-12-14: 300 mg via INTRAMUSCULAR

## 2013-12-21 ENCOUNTER — Encounter: Payer: Self-pay | Admitting: *Deleted

## 2013-12-21 ENCOUNTER — Other Ambulatory Visit (INDEPENDENT_AMBULATORY_CARE_PROVIDER_SITE_OTHER): Payer: Medicare Other

## 2013-12-21 DIAGNOSIS — E1165 Type 2 diabetes mellitus with hyperglycemia: Principal | ICD-10-CM

## 2013-12-21 DIAGNOSIS — IMO0001 Reserved for inherently not codable concepts without codable children: Secondary | ICD-10-CM

## 2013-12-21 LAB — URINALYSIS, ROUTINE W REFLEX MICROSCOPIC
BILIRUBIN URINE: NEGATIVE
Hgb urine dipstick: NEGATIVE
Leukocytes, UA: NEGATIVE
NITRITE: NEGATIVE
PH: 7 (ref 5.0–8.0)
RBC / HPF: NONE SEEN (ref 0–?)
Specific Gravity, Urine: 1.015 (ref 1.000–1.030)
TOTAL PROTEIN, URINE-UPE24: NEGATIVE
Urine Glucose: 500 — AB
Urobilinogen, UA: 0.2 (ref 0.0–1.0)

## 2013-12-21 LAB — BASIC METABOLIC PANEL
BUN: 12 mg/dL (ref 6–23)
CO2: 32 mEq/L (ref 19–32)
CREATININE: 0.7 mg/dL (ref 0.4–1.5)
Calcium: 9.4 mg/dL (ref 8.4–10.5)
Chloride: 98 mEq/L (ref 96–112)
GFR: 126.18 mL/min (ref >60.00)
Glucose, Bld: 281 mg/dL — ABNORMAL HIGH (ref 70–99)
Potassium: 4.3 mEq/L (ref 3.5–5.1)
Sodium: 137 mEq/L (ref 135–145)

## 2013-12-21 LAB — MICROALBUMIN / CREATININE URINE RATIO
Creatinine,U: 88.3 mg/dL
Microalb Creat Ratio: 2.4 mg/g (ref 0.0–30.0)
Microalb, Ur: 2.1 mg/dL — ABNORMAL HIGH (ref 0.0–1.9)

## 2013-12-23 LAB — FRUCTOSAMINE: FRUCTOSAMINE: 305 umol/L — AB (ref 190–270)

## 2013-12-24 ENCOUNTER — Other Ambulatory Visit: Payer: Self-pay | Admitting: *Deleted

## 2013-12-25 ENCOUNTER — Ambulatory Visit: Payer: Medicare Other | Admitting: Endocrinology

## 2013-12-30 ENCOUNTER — Ambulatory Visit: Payer: Medicare Other | Admitting: Endocrinology

## 2014-01-04 ENCOUNTER — Ambulatory Visit: Payer: Medicare Other

## 2014-01-04 DIAGNOSIS — E230 Hypopituitarism: Secondary | ICD-10-CM

## 2014-01-04 MED ORDER — TESTOSTERONE CYPIONATE 100 MG/ML IM SOLN
150.0000 mg | Freq: Once | INTRAMUSCULAR | Status: AC
  Administered 2014-01-04: 150 mg via INTRAMUSCULAR

## 2014-01-07 ENCOUNTER — Ambulatory Visit: Payer: Medicare Other | Admitting: Endocrinology

## 2014-01-08 ENCOUNTER — Encounter: Payer: Self-pay | Admitting: Endocrinology

## 2014-01-08 ENCOUNTER — Ambulatory Visit (INDEPENDENT_AMBULATORY_CARE_PROVIDER_SITE_OTHER): Payer: Medicare Other | Admitting: Endocrinology

## 2014-01-08 VITALS — BP 122/86 | HR 94 | Temp 97.8°F | Resp 16 | Ht 70.0 in | Wt 246.4 lb

## 2014-01-08 DIAGNOSIS — E1165 Type 2 diabetes mellitus with hyperglycemia: Principal | ICD-10-CM

## 2014-01-08 DIAGNOSIS — R61 Generalized hyperhidrosis: Secondary | ICD-10-CM

## 2014-01-08 DIAGNOSIS — E1142 Type 2 diabetes mellitus with diabetic polyneuropathy: Secondary | ICD-10-CM

## 2014-01-08 DIAGNOSIS — IMO0001 Reserved for inherently not codable concepts without codable children: Secondary | ICD-10-CM

## 2014-01-08 NOTE — Patient Instructions (Addendum)
Please check blood sugars at least half the time about 2 hours after any meal and 3 times per week on waking up. Please bring blood sugar monitor to each visit  If sugar > 200 after meals add 5 more Novolog for that meal  N insulin 48 units at bedtime
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
## 2014-01-12 ENCOUNTER — Other Ambulatory Visit: Payer: Self-pay | Admitting: *Deleted

## 2014-01-12 ENCOUNTER — Telehealth: Payer: Self-pay | Admitting: Endocrinology

## 2014-01-12 MED ORDER — VALSARTAN 80 MG PO TABS: ORAL_TABLET | ORAL | Status: DC

## 2014-01-12 MED ORDER — LEVOTHYROXINE SODIUM 50 MCG PO TABS: 50.0000 ug | ORAL_TABLET | Freq: Every morning | ORAL | Status: DC

## 2014-01-12 NOTE — Telephone Encounter (Signed)
Levothyroxine and Diovan needs to be called into the pharmacy please  Call pt once complete

## 2014-01-12 NOTE — Telephone Encounter (Signed)
rx sent, left message on patients vm

## 2014-01-14 ENCOUNTER — Telehealth: Payer: Self-pay | Admitting: Endocrinology

## 2014-01-14 ENCOUNTER — Other Ambulatory Visit: Payer: Self-pay | Admitting: *Deleted

## 2014-01-14 MED ORDER — INSULIN PEN NEEDLE 31G X 5 MM MISC: Status: DC

## 2014-01-14 NOTE — Telephone Encounter (Signed)
Patient states that he need needles for the Novalog Pin.

## 2014-01-14 NOTE — Telephone Encounter (Signed)
rx sent

## 2014-01-19 ENCOUNTER — Emergency Department (HOSPITAL_COMMUNITY)
Admission: EM | Admit: 2014-01-19 | Discharge: 2014-01-19 | Disposition: A | Discharge status: Home / Self Care | Payer: Medicare Other | Attending: Emergency Medicine | Admitting: Emergency Medicine

## 2014-01-19 ENCOUNTER — Encounter (HOSPITAL_COMMUNITY): Payer: Self-pay | Admitting: Emergency Medicine

## 2014-01-19 DIAGNOSIS — Z862 Personal history of diseases of the blood and blood-forming organs and certain disorders involving the immune mechanism: Secondary | ICD-10-CM | POA: Diagnosis not present

## 2014-01-19 DIAGNOSIS — Z794 Long term (current) use of insulin: Secondary | ICD-10-CM | POA: Insufficient documentation

## 2014-01-19 DIAGNOSIS — T148XXA Other injury of unspecified body region, initial encounter: Secondary | ICD-10-CM

## 2014-01-19 DIAGNOSIS — F3289 Other specified depressive episodes: Secondary | ICD-10-CM | POA: Insufficient documentation

## 2014-01-19 DIAGNOSIS — S199XXA Unspecified injury of neck, initial encounter: Secondary | ICD-10-CM | POA: Diagnosis present

## 2014-01-19 DIAGNOSIS — E785 Hyperlipidemia, unspecified: Secondary | ICD-10-CM | POA: Insufficient documentation

## 2014-01-19 DIAGNOSIS — Y929 Unspecified place or not applicable: Secondary | ICD-10-CM | POA: Diagnosis not present

## 2014-01-19 DIAGNOSIS — F17200 Nicotine dependence, unspecified, uncomplicated: Secondary | ICD-10-CM | POA: Diagnosis not present

## 2014-01-19 DIAGNOSIS — Z8781 Personal history of (healed) traumatic fracture: Secondary | ICD-10-CM | POA: Insufficient documentation

## 2014-01-19 DIAGNOSIS — IMO0002 Reserved for concepts with insufficient information to code with codable children: Secondary | ICD-10-CM | POA: Diagnosis not present

## 2014-01-19 DIAGNOSIS — Z8739 Personal history of other diseases of the musculoskeletal system and connective tissue: Secondary | ICD-10-CM | POA: Insufficient documentation

## 2014-01-19 DIAGNOSIS — F329 Major depressive disorder, single episode, unspecified: Secondary | ICD-10-CM | POA: Diagnosis not present

## 2014-01-19 DIAGNOSIS — X58XXXA Exposure to other specified factors, initial encounter: Secondary | ICD-10-CM | POA: Diagnosis not present

## 2014-01-19 DIAGNOSIS — Y939 Activity, unspecified: Secondary | ICD-10-CM | POA: Insufficient documentation

## 2014-01-19 DIAGNOSIS — E119 Type 2 diabetes mellitus without complications: Secondary | ICD-10-CM | POA: Insufficient documentation

## 2014-01-19 DIAGNOSIS — J45909 Unspecified asthma, uncomplicated: Secondary | ICD-10-CM | POA: Insufficient documentation

## 2014-01-19 DIAGNOSIS — E039 Hypothyroidism, unspecified: Secondary | ICD-10-CM | POA: Diagnosis not present

## 2014-01-19 DIAGNOSIS — I10 Essential (primary) hypertension: Secondary | ICD-10-CM | POA: Insufficient documentation

## 2014-01-19 DIAGNOSIS — Z79899 Other long term (current) drug therapy: Secondary | ICD-10-CM | POA: Diagnosis not present

## 2014-01-19 DIAGNOSIS — S0993XA Unspecified injury of face, initial encounter: Secondary | ICD-10-CM | POA: Diagnosis present

## 2014-01-19 LAB — CBG MONITORING, ED: Glucose-Capillary: 124 mg/dL — ABNORMAL HIGH (ref 70–99)

## 2014-01-19 MED ORDER — OXYCODONE-ACETAMINOPHEN 5-325 MG PO TABS
2.0000 | ORAL_TABLET | Freq: Once | ORAL | Status: AC
  Administered 2014-01-19: 2 via ORAL
  Filled 2014-01-19: qty 2

## 2014-01-19 MED ORDER — IBUPROFEN 600 MG PO TABS: 600.0000 mg | ORAL_TABLET | Freq: Four times a day (QID) | ORAL | Status: DC | PRN

## 2014-01-19 MED ORDER — CYCLOBENZAPRINE HCL 10 MG PO TABS: 10.0000 mg | ORAL_TABLET | Freq: Two times a day (BID) | ORAL | Status: DC | PRN

## 2014-01-19 MED ORDER — TRAMADOL HCL 50 MG PO TABS: 50.0000 mg | ORAL_TABLET | Freq: Four times a day (QID) | ORAL | Status: DC | PRN

## 2014-01-19 NOTE — ED Provider Notes (Signed)
CSN: [id number]     Arrival date & time 01/19/14  0125 History   First MD Initiated Contact with Patient 01/19/14 0405     Chief Complaint  Patient presents with  . Neck Pain     (Consider location/radiation/quality/duration/timing/severity/associated sxs/prior Treatment) HPI  This patient is a 63 yo man who presents with pain over the region of the right trapezius m. He says he strained it 3 days ago and has tried ibuprofen but, still has pain. He denies any centrally located neck or back pain. No fever. The pain is moderate in severity, nonradiating, is worse with certain movements of the neck.  The patient denies paresthesias and motor weakness.  Past Medical History  Diagnosis Date  . Lymphocytosis   . Mediastinal lymphadenopathy   . Diabetes mellitus   . Hyperlipidemia   . HTN (hypertension)   . Asthma   . Depression   . Fracture of fibula, distal, right, closed 02/01/2012  . Hypothyroidism   . Arthritis    Past Surgical History  Procedure Laterality Date  . Carpal tunnel release  08-2009    right  . Tonsillectomy    . Eye surgery  2005    both cataracts  . Right leg surgery       plate at ankle   . Shoulder open rotator cuff repair Right 01/08/2013    Procedure: ROTATOR CUFF REPAIR SHOULDER OPEN;  Surgeon: Tobi Bastos, MD;  Location: [facility];  Service: Orthopedics;  Laterality: Right;  With Patch Graft   Family History  Problem Relation Age of Onset  . Allergies Sister   . Cancer Mother     stomach  . Pancreatic cancer Father    History  Substance Use Topics  . Smoking status: Current Every Day Smoker -- 0.50 packs/day for 6 years    Types: Cigarettes  . Smokeless tobacco: Never Used  . Alcohol Use: No     Comment: quit in 2005    Review of Systems  Ten point review of symptoms performed and is negative with the exception of symptoms noted above.   Allergies  Eszopiclone and Neosporin  Home Medications   Prior to Admission medications    Medication Sig Start Date End Date Taking? Authorizing Provider  albuterol (PROVENTIL HFA;VENTOLIN HFA) 108 (90 BASE) MCG/ACT inhaler Inhale 2 puffs into the lungs every 6 (six) hours as needed for wheezing or shortness of breath.    Yes Historical Provider, MD  atorvastatin (LIPITOR) 10 MG tablet Take 10 mg by mouth every morning.    Yes Historical Provider, MD  insulin NPH Human (HUMULIN N,NOVOLIN N) 100 UNIT/ML injection Inject 25-40 Units into the skin 2 (two) times daily before a meal. Sliding scale   Yes Historical Provider, MD  levothyroxine (SYNTHROID, LEVOTHROID) 50 MCG tablet Take 1 tablet (50 mcg total) by mouth every morning. 01/12/14  Yes Elayne Snare, MD  metFORMIN (GLUCOPHAGE) 1000 MG tablet Take 1 tablet (1,000 mg total) by mouth 2 (two) times daily with a meal. 09/15/13  Yes Elayne Snare, MD  PARoxetine (PAXIL) 40 MG tablet Take 40 mg by mouth at bedtime.    Yes Historical Provider, MD  testosterone cypionate (DEPOTESTOTERONE CYPIONATE) 200 MG/ML injection Inject 300 mg into the muscle every 14 (fourteen) days.   Yes Historical Provider, MD  valsartan (DIOVAN) 80 MG tablet Take 80 mg by mouth daily.   Yes Historical Provider, MD  B-D INS SYRINGE 0.5CC/31GX5/16 31G X 5/16" 0.5 ML MISC  07/30/13  Historical Provider, MD  Insulin Pen Needle (B-D UF III MINI PEN NEEDLES) 31G X 5 MM MISC Use 3 per day to inject insulin 01/14/14   Elayne Snare, MD   BP 123/78  Pulse 85  Temp(Src) 98.6 F (37 C) (Oral)  Resp 19  Wt 247 lb 4 oz (112.152 kg)  SpO2 95% Physical Exam Gen: well developed and well nourished appearing Head: NCAT Eyes: PERL, EOMI Nose: no epistaixis or rhinorrhea Mouth/throat: mucosa is moist and pink Neck: supple, no stridor no midline tenderness, there is tenderness with palpable trigger points over the body of the trapezius muscle on the right Lungs: CTA B, no wheezing, rhonchi or rales CV: RRR, no murmur, extremities appear well perfused.  Abd: soft, notender,  nondistended Back: no ttp, no cva ttp Skin: warm and dry Ext: normal to inspection, no dependent edema Neuro: CN ii-xii grossly intact, no focal deficits Psyche; normal affect,  calm and cooperative.  ED Course  Procedures (including critical care time) Labs Review Labs Reviewed  CBG MONITORING, ED - Abnormal; Notable for the following:    Glucose-Capillary 124 (*)    All other components within normal limits    MDM   Patient with strain of the right trapezius muscle. We have treated with Percocet in the emergency department patient is currently pain-free and asking to go home.    Elyn Peers, MD 01/19/14 [phone]

## 2014-01-19 NOTE — ED Notes (Signed)
Pt states neck pain right side. Pt states he was moving a couch a week ago and doesn't know if that was the reason. Pt states it also radiates down his right arm with numbness.

## 2014-02-01 ENCOUNTER — Telehealth: Payer: Self-pay | Admitting: Endocrinology

## 2014-02-01 NOTE — Telephone Encounter (Signed)
Patient is returing your call °

## 2014-02-02 ENCOUNTER — Encounter (INDEPENDENT_AMBULATORY_CARE_PROVIDER_SITE_OTHER): Payer: Medicare Other | Admitting: Endocrinology

## 2014-02-02 DIAGNOSIS — E230 Hypopituitarism: Secondary | ICD-10-CM

## 2014-02-02 DIAGNOSIS — E236 Other disorders of pituitary gland: Secondary | ICD-10-CM

## 2014-02-02 MED ORDER — TESTOSTERONE CYPIONATE 200 MG/ML IM SOLN
300.0000 mg | Freq: Once | INTRAMUSCULAR | Status: AC
  Administered 2014-02-02: 300 mg via INTRAMUSCULAR

## 2014-02-07 DIAGNOSIS — I10 Essential (primary) hypertension: Secondary | ICD-10-CM | POA: Diagnosis not present

## 2014-02-07 DIAGNOSIS — E119 Type 2 diabetes mellitus without complications: Secondary | ICD-10-CM | POA: Insufficient documentation

## 2014-02-07 DIAGNOSIS — F3289 Other specified depressive episodes: Secondary | ICD-10-CM | POA: Insufficient documentation

## 2014-02-07 DIAGNOSIS — F329 Major depressive disorder, single episode, unspecified: Secondary | ICD-10-CM | POA: Diagnosis not present

## 2014-02-07 DIAGNOSIS — M129 Arthropathy, unspecified: Secondary | ICD-10-CM | POA: Diagnosis not present

## 2014-02-07 DIAGNOSIS — E039 Hypothyroidism, unspecified: Secondary | ICD-10-CM | POA: Diagnosis not present

## 2014-02-07 DIAGNOSIS — D72829 Elevated white blood cell count, unspecified: Secondary | ICD-10-CM | POA: Diagnosis not present

## 2014-02-07 DIAGNOSIS — Z794 Long term (current) use of insulin: Secondary | ICD-10-CM | POA: Insufficient documentation

## 2014-02-07 DIAGNOSIS — Z79899 Other long term (current) drug therapy: Secondary | ICD-10-CM | POA: Insufficient documentation

## 2014-02-07 DIAGNOSIS — E785 Hyperlipidemia, unspecified: Secondary | ICD-10-CM | POA: Diagnosis not present

## 2014-02-07 DIAGNOSIS — M79609 Pain in unspecified limb: Secondary | ICD-10-CM | POA: Insufficient documentation

## 2014-02-07 DIAGNOSIS — J45909 Unspecified asthma, uncomplicated: Secondary | ICD-10-CM | POA: Insufficient documentation

## 2014-02-07 DIAGNOSIS — Z8781 Personal history of (healed) traumatic fracture: Secondary | ICD-10-CM | POA: Insufficient documentation

## 2014-02-07 DIAGNOSIS — F17200 Nicotine dependence, unspecified, uncomplicated: Secondary | ICD-10-CM | POA: Diagnosis not present

## 2014-02-07 DIAGNOSIS — L97509 Non-pressure chronic ulcer of other part of unspecified foot with unspecified severity: Secondary | ICD-10-CM | POA: Diagnosis not present

## 2014-02-08 ENCOUNTER — Emergency Department (HOSPITAL_COMMUNITY)
Admission: EM | Admit: 2014-02-08 | Discharge: 2014-02-08 | Disposition: A | Discharge status: Home / Self Care | Payer: Medicare Other | Attending: Emergency Medicine | Admitting: Emergency Medicine

## 2014-02-08 ENCOUNTER — Encounter (HOSPITAL_COMMUNITY): Payer: Self-pay | Admitting: Emergency Medicine

## 2014-02-08 ENCOUNTER — Emergency Department (HOSPITAL_COMMUNITY): Payer: Medicare Other

## 2014-02-08 ENCOUNTER — Other Ambulatory Visit: Payer: Self-pay | Admitting: *Deleted

## 2014-02-08 DIAGNOSIS — L97512 Non-pressure chronic ulcer of other part of right foot with fat layer exposed: Secondary | ICD-10-CM

## 2014-02-08 LAB — CBC WITH DIFFERENTIAL/PLATELET
BASOS PCT: 0 % (ref 0–1)
Basophils Absolute: 0.1 10*3/uL (ref 0.0–0.1)
Eosinophils Absolute: 0.3 10*3/uL (ref 0.0–0.7)
Eosinophils Relative: 2 % (ref 0–5)
HCT: 47.4 % (ref 39.0–52.0)
HEMOGLOBIN: 16.3 g/dL (ref 13.0–17.0)
Lymphocytes Relative: 17 % (ref 12–46)
Lymphs Abs: 2.9 10*3/uL (ref 0.7–4.0)
MCH: 30.3 pg (ref 26.0–34.0)
MCHC: 34.4 g/dL (ref 30.0–36.0)
MCV: 88.1 fL (ref 78.0–100.0)
MONOS PCT: 6 % (ref 3–12)
Monocytes Absolute: 1.1 10*3/uL — ABNORMAL HIGH (ref 0.1–1.0)
NEUTROS PCT: 75 % (ref 43–77)
Neutro Abs: 13.1 10*3/uL — ABNORMAL HIGH (ref 1.7–7.7)
Platelets: 223 10*3/uL (ref 150–400)
RBC: 5.38 MIL/uL (ref 4.22–5.81)
RDW: 13.7 % (ref 11.5–15.5)
WBC: 17.5 10*3/uL — ABNORMAL HIGH (ref 4.0–10.5)

## 2014-02-08 LAB — BASIC METABOLIC PANEL
ANION GAP: 13 (ref 5–15)
BUN: 25 mg/dL — AB (ref 6–23)
CHLORIDE: 98 meq/L (ref 96–112)
CO2: 24 mEq/L (ref 19–32)
Calcium: 9 mg/dL (ref 8.4–10.5)
Creatinine, Ser: 0.72 mg/dL (ref 0.50–1.35)
GFR calc non Af Amer: >90 mL/min (ref >90)
Glucose, Bld: 184 mg/dL — ABNORMAL HIGH (ref 70–99)
Potassium: 4.2 mEq/L (ref 3.7–5.3)
SODIUM: 135 meq/L — AB (ref 137–147)

## 2014-02-08 MED ORDER — SULFAMETHOXAZOLE-TRIMETHOPRIM 800-160 MG PO TABS: 1.0000 | ORAL_TABLET | Freq: Two times a day (BID) | ORAL | Status: DC

## 2014-02-08 MED ORDER — NAPROXEN 500 MG PO TABS: 500.0000 mg | ORAL_TABLET | Freq: Two times a day (BID) | ORAL | Status: DC

## 2014-02-08 MED ORDER — OXYCODONE-ACETAMINOPHEN 5-325 MG PO TABS
2.0000 | ORAL_TABLET | Freq: Once | ORAL | Status: AC
  Administered 2014-02-08: 2 via ORAL
  Filled 2014-02-08: qty 2

## 2014-02-08 MED ORDER — METFORMIN HCL 1000 MG PO TABS: 1000.0000 mg | ORAL_TABLET | Freq: Two times a day (BID) | ORAL | Status: DC

## 2014-02-08 MED ORDER — CEPHALEXIN 500 MG PO CAPS: 500.0000 mg | ORAL_CAPSULE | Freq: Four times a day (QID) | ORAL | Status: DC

## 2014-02-08 MED ORDER — VANCOMYCIN HCL 10 G IV SOLR
1500.0000 mg | Freq: Once | INTRAVENOUS | Status: AC
  Administered 2014-02-08: 1500 mg via INTRAVENOUS
  Filled 2014-02-08: qty 1500

## 2014-02-08 NOTE — ED Provider Notes (Signed)
CSN: [id number]     Arrival date & time 02/07/14  2355 History   First MD Initiated Contact with Patient 02/08/14 [phone]     Chief Complaint  Patient presents with  . Toe Pain     (Consider location/radiation/quality/duration/timing/severity/associated sxs/prior Treatment) HPI Comments: 63 year old male, Derek Calderon presents as a diabetic with an ulcer to the bottom of his right toe. He states that this has been present for many months that has been minimally tender and has not been draining at all. In the last couple of days he states that the blister that was present ruptured and there was a clear watery drainage which has been there for several days. He denies any fevers, vomiting, nausea or swelling of the foot or toe. 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  Patient is a 62 y.o. male presenting with toe pain. The history is provided by the patient.  Toe Pain    Past Medical History  Diagnosis Date  . Lymphocytosis   . Mediastinal lymphadenopathy   . Diabetes mellitus   . Hyperlipidemia   . HTN (hypertension)   . Asthma   . Depression   . Fracture of fibula, distal, right, closed 02/01/2012  . Hypothyroidism   . Arthritis    Past Surgical History  Procedure Laterality Date  . Carpal tunnel release  08-2009    right  . Tonsillectomy    . Eye surgery  2005    both cataracts  . Right leg surgery       plate at ankle   . Shoulder open rotator cuff repair Right 01/08/2013    Procedure: ROTATOR CUFF REPAIR SHOULDER OPEN;  Surgeon: Tobi Bastos, MD;  Location: [facility];  Service: Orthopedics;  Laterality: Right;  With Patch Graft   Family History  Problem Relation Age of Onset  . Allergies Sister   . Cancer Mother     stomach  . Pancreatic cancer Father    History  Substance Use Topics  . Smoking status: Current Every Day Smoker -- 0.50 packs/day for 6 years    Types: Cigarettes  . Smokeless tobacco: Never Used  . Alcohol Use: No      Comment: quit in 2005    Review of Systems  Constitutional: Negative for fever and chills.  Skin: Positive for wound.      Allergies  Eszopiclone and Neosporin  Home Medications   Prior to Admission medications   Medication Sig Start Date End Date Taking? Authorizing Provider  atorvastatin (LIPITOR) 10 MG tablet Take 10 mg by mouth every morning.    Yes Historical Provider, MD  cyclobenzaprine (FLEXERIL) 10 MG tablet Take 1 tablet (10 mg total) by mouth 2 (two) times daily as needed for muscle spasms. 01/19/14  Yes Elyn Peers, MD  insulin NPH Human (HUMULIN N,NOVOLIN N) 100 UNIT/ML injection Inject 25-40 Units into the skin 2 (two) times daily before a meal. Sliding scale   Yes Historical Provider, MD  levothyroxine (SYNTHROID, LEVOTHROID) 50 MCG tablet Take 1 tablet (50 mcg total) by mouth every morning. 01/12/14  Yes Elayne Snare, MD  metFORMIN (GLUCOPHAGE) 1000 MG tablet Take 1 tablet (1,000 mg total) by mouth 2 (two) times daily with a meal. 09/15/13  Yes Elayne Snare, MD  PARoxetine (PAXIL) 40 MG tablet Take 40 mg by mouth at bedtime.    Yes Historical Provider, MD  valsartan (DIOVAN) 80 MG tablet Take 80 mg by mouth daily.  Yes Historical Provider, MD  albuterol (PROVENTIL HFA;VENTOLIN HFA) 108 (90 BASE) MCG/ACT inhaler Inhale 2 puffs into the lungs every 6 (six) hours as needed for wheezing or shortness of breath.     Historical Provider, MD  cephALEXin (KEFLEX) 500 MG capsule Take 1 capsule (500 mg total) by mouth 4 (four) times daily. 02/08/14   Johnna Acosta, MD  naproxen (NAPROSYN) 500 MG tablet Take 1 tablet (500 mg total) by mouth 2 (two) times daily with a meal. 02/08/14   Johnna Acosta, MD  sulfamethoxazole-trimethoprim (SEPTRA DS) 800-160 MG per tablet Take 1 tablet by mouth every 12 (twelve) hours. 02/08/14   Johnna Acosta, MD  testosterone cypionate (DEPOTESTOTERONE CYPIONATE) 200 MG/ML injection Inject 300 mg into the muscle every 14 (fourteen) days.    Historical  Provider, MD   BP 109/70  Pulse 99  Temp(Src) 98.2 F (36.8 C) (Oral)  Resp 18  SpO2 98% Physical Exam  Nursing note and vitals reviewed. Constitutional: He appears well-developed and well-nourished. No distress.  HENT:  Head: Normocephalic and atraumatic.  Eyes: Conjunctivae are normal. Right eye exhibits no discharge. Left eye exhibits no discharge. No scleral icterus.  Cardiovascular: Normal rate and intact distal pulses.   No murmur heard. Pulmonary/Chest: Effort normal. No respiratory distress. He has no wheezes. He has no rales.  Neurological: He is alert. Coordination normal.  Skin:  Right great toe, plantar surface has approximately 2 cm in diameter ulcer, and no purulence, no foul smell, no discharge, minimal surrounding erythema, there are other callused lesions on the bilateral feet as well, minimally tender. No swelling of the feet or ankles, no edema    ED Course  Procedures (including critical care time) Labs Review Labs Reviewed  CBC WITH DIFFERENTIAL - Abnormal; Notable for the following:    WBC 17.5 (*)    Neutro Abs 13.1 (*)    Monocytes Absolute 1.1 (*)    All other components within normal limits  BASIC METABOLIC PANEL - Abnormal; Notable for the following:    Sodium 135 (*)    Glucose, Bld 184 (*)    BUN 25 (*)    All other components within normal limits    Imaging Review Dg Toe Great Right  02/08/2014   CLINICAL DATA:  Pain in the entire foot. Ulcer on the bottom of the great toe.  EXAM: RIGHT GREAT TOE  COMPARISON:  No priors.  FINDINGS: Soft tissue ulcer on the plantar aspect of the great toe beneath the interphalangeal joint. No underlying osteolysis to strongly suggest associated osteomyelitis at this time. No acute displaced fracture, subluxation or dislocation.  IMPRESSION: 1. Ulcer on the plantar aspect of the great toe, without acute osseous abnormality.   Electronically Signed   By: Vinnie Langton M.D.   On: 02/08/2014 01:55      MDM    Final diagnoses:  Ulcer of toe, right, with fat layer exposed    The patient does have a leukocytosis but his vital signs are normal, x-ray shows no osteomyelitis, will treat with medications as below, recommended followup with podiatrist, patient in agreement.   Meds given in ED:  Medications  vancomycin (VANCOCIN) 1,500 mg in sodium chloride 0.9 % 500 mL IVPB (1,500 mg Intravenous New Bag/Given 02/08/14 0226)  oxyCODONE-acetaminophen (PERCOCET/ROXICET) 5-325 MG per tablet 2 tablet (2 tablets Oral Given 02/08/14 0112)    New Prescriptions   CEPHALEXIN (KEFLEX) 500 MG CAPSULE    Take 1 capsule (500 mg total) by mouth  4 (four) times daily.   NAPROXEN (NAPROSYN) 500 MG TABLET    Take 1 tablet (500 mg total) by mouth 2 (two) times daily with a meal.   SULFAMETHOXAZOLE-TRIMETHOPRIM (SEPTRA DS) 800-160 MG PER TABLET    Take 1 tablet by mouth every 12 (twelve) hours.        Johnna Acosta, MD 02/08/14 [phone]

## 2014-02-08 NOTE — ED Notes (Signed)
Pt. reports chronic right big toe blister that ruptured  with pain and drainage for several days .

## 2014-02-08 NOTE — ED Notes (Signed)
IV Vanc infusing , pt will be discharged after IV dine infusing

## 2014-02-08 NOTE — ED Provider Notes (Signed)
Medical screening examination/treatment/procedure(s) were performed by non-physician practitioner and as supervising physician I was immediately available for consultation/collaboration.   EKG Interpretation None        Julianne Rice, MD 02/08/14 [phone]

## 2014-02-08 NOTE — Discharge Instructions (Signed)
You must go to the podiatrist office this week to have a recheck of your foot. See the phone number above or see the podiatrist of your choice. If you develop swelling, redness, fever, pus, you must return to the hospital immediately for a recheck. Please take both antibiotics exactly as prescribed.  Please call your doctor for a followup appointment within 24-48 hours. When you talk to your doctor please let them know that you were seen in the emergency department and have them acquire all of your records so that they can discuss the findings with you and formulate a treatment plan to fully care for your new and ongoing problems.

## 2014-02-08 NOTE — ED Provider Notes (Signed)
MSE was initiated and I personally evaluated the patient and placed orders (if any) at  12:44 AM on February 08, 2014.  The patient appears stable so that the remainder of the MSE may be completed by another provider.  Patient presents emergency department with chief complaints of blister to his toe. He reports past medical history of diabetes. He states that he has had a blistered growing for quite some time now, but he hasn't looked at it in several days. Today he noticed that ruptured, and drained a copious amount of fluid into his sock. There is now a significant ulceration to his right great toe. He reports moderate to severe pain with palpation and movement of the great toe. He denies any fevers, chills, nausea, or vomiting. He has not tried taking anything to alleviate his symptoms.  PE: Right great toe remarkable for 2 x 2 cm ulceration with moderate surrounding erythema   Patient with diabetic foot ulcer, with concern for possible bony involvement vs. cellulitis. At this time, the patient is appropriate to be moved to an acute bed for laboratory tests, imaging, and further workup as needed   Montine Circle, PA-C 02/08/14 1975

## 2014-02-08 NOTE — ED Notes (Signed)
Pharmacy called in regards to vancomycin delay, states it will be up soon

## 2014-02-13 ENCOUNTER — Encounter (HOSPITAL_COMMUNITY): Payer: Self-pay | Admitting: Emergency Medicine

## 2014-02-13 DIAGNOSIS — Z8781 Personal history of (healed) traumatic fracture: Secondary | ICD-10-CM | POA: Diagnosis not present

## 2014-02-13 DIAGNOSIS — L8990 Pressure ulcer of unspecified site, unspecified stage: Secondary | ICD-10-CM | POA: Insufficient documentation

## 2014-02-13 DIAGNOSIS — J45909 Unspecified asthma, uncomplicated: Secondary | ICD-10-CM | POA: Insufficient documentation

## 2014-02-13 DIAGNOSIS — E785 Hyperlipidemia, unspecified: Secondary | ICD-10-CM | POA: Diagnosis not present

## 2014-02-13 DIAGNOSIS — Z792 Long term (current) use of antibiotics: Secondary | ICD-10-CM | POA: Insufficient documentation

## 2014-02-13 DIAGNOSIS — Z794 Long term (current) use of insulin: Secondary | ICD-10-CM | POA: Insufficient documentation

## 2014-02-13 DIAGNOSIS — F329 Major depressive disorder, single episode, unspecified: Secondary | ICD-10-CM | POA: Insufficient documentation

## 2014-02-13 DIAGNOSIS — I10 Essential (primary) hypertension: Secondary | ICD-10-CM | POA: Insufficient documentation

## 2014-02-13 DIAGNOSIS — E039 Hypothyroidism, unspecified: Secondary | ICD-10-CM | POA: Diagnosis not present

## 2014-02-13 DIAGNOSIS — L02619 Cutaneous abscess of unspecified foot: Secondary | ICD-10-CM | POA: Diagnosis not present

## 2014-02-13 DIAGNOSIS — F17200 Nicotine dependence, unspecified, uncomplicated: Secondary | ICD-10-CM | POA: Insufficient documentation

## 2014-02-13 DIAGNOSIS — L98499 Non-pressure chronic ulcer of skin of other sites with unspecified severity: Secondary | ICD-10-CM | POA: Diagnosis present

## 2014-02-13 DIAGNOSIS — Z79899 Other long term (current) drug therapy: Secondary | ICD-10-CM | POA: Insufficient documentation

## 2014-02-13 DIAGNOSIS — F3289 Other specified depressive episodes: Secondary | ICD-10-CM | POA: Insufficient documentation

## 2014-02-13 DIAGNOSIS — M129 Arthropathy, unspecified: Secondary | ICD-10-CM | POA: Insufficient documentation

## 2014-02-13 DIAGNOSIS — L03039 Cellulitis of unspecified toe: Secondary | ICD-10-CM | POA: Insufficient documentation

## 2014-02-13 DIAGNOSIS — Z23 Encounter for immunization: Secondary | ICD-10-CM | POA: Insufficient documentation

## 2014-02-13 DIAGNOSIS — L97509 Non-pressure chronic ulcer of other part of unspecified foot with unspecified severity: Secondary | ICD-10-CM | POA: Diagnosis not present

## 2014-02-13 DIAGNOSIS — E1169 Type 2 diabetes mellitus with other specified complication: Secondary | ICD-10-CM | POA: Insufficient documentation

## 2014-02-13 DIAGNOSIS — Z791 Long term (current) use of non-steroidal anti-inflammatories (NSAID): Secondary | ICD-10-CM | POA: Diagnosis not present

## 2014-02-13 DIAGNOSIS — Z862 Personal history of diseases of the blood and blood-forming organs and certain disorders involving the immune mechanism: Secondary | ICD-10-CM | POA: Diagnosis not present

## 2014-02-13 LAB — COMPREHENSIVE METABOLIC PANEL
ALK PHOS: 72 U/L (ref 39–117)
ALT: 16 U/L (ref 0–53)
AST: 18 U/L (ref 0–37)
Albumin: 3.7 g/dL (ref 3.5–5.2)
Anion gap: 16 — ABNORMAL HIGH (ref 5–15)
BILIRUBIN TOTAL: 0.3 mg/dL (ref 0.3–1.2)
BUN: 11 mg/dL (ref 6–23)
CO2: 24 meq/L (ref 19–32)
Calcium: 9.3 mg/dL (ref 8.4–10.5)
Chloride: 95 mEq/L — ABNORMAL LOW (ref 96–112)
Creatinine, Ser: 0.8 mg/dL (ref 0.50–1.35)
GFR calc Af Amer: >90 mL/min (ref >90)
Glucose, Bld: 214 mg/dL — ABNORMAL HIGH (ref 70–99)
POTASSIUM: 4.6 meq/L (ref 3.7–5.3)
SODIUM: 135 meq/L — AB (ref 137–147)
Total Protein: 7 g/dL (ref 6.0–8.3)

## 2014-02-13 LAB — CBC
HCT: 49 % (ref 39.0–52.0)
Hemoglobin: 17 g/dL (ref 13.0–17.0)
MCH: 30.5 pg (ref 26.0–34.0)
MCHC: 34.7 g/dL (ref 30.0–36.0)
MCV: 87.8 fL (ref 78.0–100.0)
Platelets: 207 10*3/uL (ref 150–400)
RBC: 5.58 MIL/uL (ref 4.22–5.81)
RDW: 13.7 % (ref 11.5–15.5)
WBC: 16.2 10*3/uL — AB (ref 4.0–10.5)

## 2014-02-13 NOTE — ED Notes (Signed)
Pt reports diabetic ulcer to right large toe, seen here last week for same. States that he was sent home on abx but reports redness, drainage and pain to area has worsened. Pt denies fever/chills. Pt in NAD.

## 2014-02-14 ENCOUNTER — Emergency Department (HOSPITAL_COMMUNITY)
Admission: EM | Admit: 2014-02-14 | Discharge: 2014-02-14 | Disposition: A | Discharge status: Home / Self Care | Payer: Medicare Other | Attending: Emergency Medicine | Admitting: Emergency Medicine

## 2014-02-14 DIAGNOSIS — L97519 Non-pressure chronic ulcer of other part of right foot with unspecified severity: Secondary | ICD-10-CM

## 2014-02-14 DIAGNOSIS — E11621 Type 2 diabetes mellitus with foot ulcer: Secondary | ICD-10-CM

## 2014-02-14 DIAGNOSIS — L03031 Cellulitis of right toe: Secondary | ICD-10-CM

## 2014-02-14 DIAGNOSIS — L97509 Non-pressure chronic ulcer of other part of unspecified foot with unspecified severity: Secondary | ICD-10-CM

## 2014-02-14 MED ORDER — CLINDAMYCIN HCL 300 MG PO CAPS
300.0000 mg | ORAL_CAPSULE | Freq: Once | ORAL | Status: AC
  Administered 2014-02-14: 300 mg via ORAL
  Filled 2014-02-14: qty 1

## 2014-02-14 MED ORDER — TRAMADOL HCL 50 MG PO TABS: 50.0000 mg | ORAL_TABLET | Freq: Four times a day (QID) | ORAL | Status: DC | PRN

## 2014-02-14 MED ORDER — TETANUS-DIPHTHERIA TOXOIDS TD 5-2 LFU IM INJ
0.5000 mL | INJECTION | Freq: Once | INTRAMUSCULAR | Status: AC
  Administered 2014-02-14: 0.5 mL via INTRAMUSCULAR
  Filled 2014-02-14: qty 0.5

## 2014-02-14 MED ORDER — CLINDAMYCIN HCL 300 MG PO CAPS: 300.0000 mg | ORAL_CAPSULE | Freq: Four times a day (QID) | ORAL | Status: DC

## 2014-02-14 NOTE — ED Notes (Signed)
Family at bedside. 

## 2014-02-14 NOTE — ED Provider Notes (Signed)
CSN: [id number]     Arrival date & time 02/13/14  2222 History   First MD Initiated Contact with Patient 02/14/14 [phone]     Chief Complaint  Patient presents with  . Skin Ulcer     (Consider location/radiation/quality/duration/timing/severity/associated sxs/prior Treatment) HPI Patient is a middle aged diabetic who is here with complaints of painful ulcer over the plantar aspect of the right great toe. He says that ulcer has been present for a couple of weeks.Howver, he was scrubbing the ulcerated region last night and says, "it busted open" and there was foul smelling discharge. He was seen for the same complaint by Dr. Noemi Chapel on 02/08/14. He had an xray of the toe which was negative for signs of osteomyelitis. He was started on Bactrim and discharge. He reports compliance with Bactrim. He has not followed up with his PCP.   Past Medical History  Diagnosis Date  . Lymphocytosis   . Mediastinal lymphadenopathy   . Diabetes mellitus   . Hyperlipidemia   . HTN (hypertension)   . Asthma   . Depression   . Fracture of fibula, distal, right, closed 02/01/2012  . Hypothyroidism   . Arthritis    Past Surgical History  Procedure Laterality Date  . Carpal tunnel release  08-2009    right  . Tonsillectomy    . Eye surgery  2005    both cataracts  . Right leg surgery       plate at ankle   . Shoulder open rotator cuff repair Right 01/08/2013    Procedure: ROTATOR CUFF REPAIR SHOULDER OPEN;  Surgeon: Tobi Bastos, MD;  Location: [facility];  Service: Orthopedics;  Laterality: Right;  With Patch Graft   Family History  Problem Relation Age of Onset  . Allergies Sister   . Cancer Mother     stomach  . Pancreatic cancer Father    History  Substance Use Topics  . Smoking status: Current Every Day Smoker -- 0.50 packs/day for 6 years    Types: Cigarettes  . Smokeless tobacco: Never Used  . Alcohol Use: No     Comment: quit in 2005    Review of Systems   10 point review of  symptoms obtained and is negative with the exceptions of symptoms noted abov.e    Allergies  Eszopiclone and Neosporin  Home Medications   Prior to Admission medications   Medication Sig Start Date End Date Taking? Authorizing Provider  albuterol (PROVENTIL HFA;VENTOLIN HFA) 108 (90 BASE) MCG/ACT inhaler Inhale 2 puffs into the lungs every 6 (six) hours as needed for wheezing or shortness of breath.     Historical Provider, MD  atorvastatin (LIPITOR) 10 MG tablet Take 10 mg by mouth every morning.     Historical Provider, MD  cephALEXin (KEFLEX) 500 MG capsule Take 1 capsule (500 mg total) by mouth 4 (four) times daily. 02/08/14   Johnna Acosta, MD  cyclobenzaprine (FLEXERIL) 10 MG tablet Take 1 tablet (10 mg total) by mouth 2 (two) times daily as needed for muscle spasms. 01/19/14   Elyn Peers, MD  insulin NPH Human (HUMULIN N,NOVOLIN N) 100 UNIT/ML injection Inject 25-40 Units into the skin 2 (two) times daily before a meal. Sliding scale    Historical Provider, MD  levothyroxine (SYNTHROID, LEVOTHROID) 50 MCG tablet Take 1 tablet (50 mcg total) by mouth every morning. 01/12/14   Elayne Snare, MD  metFORMIN (GLUCOPHAGE) 1000 MG tablet Take 1 tablet (1,000 mg total) by mouth 2 (two)  times daily with a meal. 02/08/14   Elayne Snare, MD  naproxen (NAPROSYN) 500 MG tablet Take 1 tablet (500 mg total) by mouth 2 (two) times daily with a meal. 02/08/14   Johnna Acosta, MD  PARoxetine (PAXIL) 40 MG tablet Take 40 mg by mouth at bedtime.     Historical Provider, MD  sulfamethoxazole-trimethoprim (SEPTRA DS) 800-160 MG per tablet Take 1 tablet by mouth every 12 (twelve) hours. 02/08/14   Johnna Acosta, MD  testosterone cypionate (DEPOTESTOTERONE CYPIONATE) 200 MG/ML injection Inject 300 mg into the muscle every 14 (fourteen) days.    Historical Provider, MD  valsartan (DIOVAN) 80 MG tablet Take 80 mg by mouth daily.    Historical Provider, MD   BP 97/69  Pulse 83  Temp(Src) 98.1 F (36.7 C) (Oral)   Resp 19  SpO2 97% Physical Exam  Gen: well nourished and well developed appearing Head: NCAT Ears: normal to inspection Nose: normal to inspection, no epistaxis or drainage Mouth: oral mucsoa is well hydrated appearing, normal posterior oropharynx Neck: supple, no stridor CV: RRR, no murmur, palpable peripheral pulses Resp: lung sounds are clear to auscultation bilaterally, no wheeing or rhonchi or rales, normal respiratory effort.  Abd: soft, nontender, nondistended Extremities: 2cm x 2cm ulcerated lesion with clean base and no discharge but surrounding erythema over the platar aspect of right great toe. Cap refill < 2s, Palpable DP pulses - equal.   Skin: warm and dry Neuro: CN ii - XII, no focal deficitis Psyche; normal affect, cooperative.   ED Course  Procedures (including critical care time) Labs Review  Results for orders placed during the hospital encounter of 02/14/14 (from the past 24 hour(s))  CBC     Status: Abnormal   Collection Time    02/13/14 10:36 PM      Result Value Ref Range   WBC 16.2 (*) 4.0 - 10.5 K/uL   RBC 5.58  4.22 - 5.81 MIL/uL   Hemoglobin 17.0  13.0 - 17.0 g/dL   HCT 49.0  39.0 - 52.0 %   MCV 87.8  78.0 - 100.0 fL   MCH 30.5  26.0 - 34.0 pg   MCHC 34.7  30.0 - 36.0 g/dL   RDW 13.7  11.5 - 15.5 %   Platelets 207  150 - 400 K/uL  COMPREHENSIVE METABOLIC PANEL     Status: Abnormal   Collection Time    02/13/14 10:36 PM      Result Value Ref Range   Sodium 135 (*) 137 - 147 mEq/L   Potassium 4.6  3.7 - 5.3 mEq/L   Chloride 95 (*) 96 - 112 mEq/L   CO2 24  19 - 32 mEq/L   Glucose, Bld 214 (*) 70 - 99 mg/dL   BUN 11  6 - 23 mg/dL   Creatinine, Ser 0.80  0.50 - 1.35 mg/dL   Calcium 9.3  8.4 - 10.5 mg/dL   Total Protein 7.0  6.0 - 8.3 g/dL   Albumin 3.7  3.5 - 5.2 g/dL   AST 18  0 - 37 U/L   ALT 16  0 - 53 U/L   Alkaline Phosphatase 72  39 - 117 U/L   Total Bilirubin 0.3  0.3 - 1.2 mg/dL   GFR calc non Af Amer >90  >90 mL/min   GFR calc Af  Amer >90  >90 mL/min   Anion gap 16 (*) 5 - 15    MDM   Patient with persistent cellulitis  of the right great toe - minimal improvement on Bactrim. The patient is afebrile and nontoxic appearing with mild improvement in WBC from 8/10. We will d/c Bactrim in favor of Clindamycin with better anaerobic coverage. The patient is stable for d/c and is urged to f/u with his PCP. We have discussed return precautions.    Elyn Peers, MD 02/14/14 [phone]

## 2014-02-14 NOTE — ED Notes (Signed)
No answer when paged.

## 2014-02-14 NOTE — ED Notes (Signed)
Patient states he has neuropathy in his feet.

## 2014-02-14 NOTE — Discharge Instructions (Signed)

## 2014-02-19 ENCOUNTER — Other Ambulatory Visit: Payer: Self-pay | Admitting: *Deleted

## 2014-02-19 ENCOUNTER — Ambulatory Visit: Payer: Medicare Other

## 2014-02-19 DIAGNOSIS — E230 Hypopituitarism: Secondary | ICD-10-CM

## 2014-02-19 MED ORDER — GABAPENTIN 100 MG PO CAPS: 100.0000 mg | ORAL_CAPSULE | Freq: Three times a day (TID) | ORAL | Status: AC

## 2014-02-19 MED ORDER — TESTOSTERONE CYPIONATE 200 MG/ML IM SOLN
300.0000 mg | Freq: Once | INTRAMUSCULAR | Status: AC
  Administered 2014-02-19: 300 mg via INTRAMUSCULAR

## 2014-02-27 ENCOUNTER — Encounter (HOSPITAL_COMMUNITY): Payer: Self-pay | Admitting: Emergency Medicine

## 2014-02-27 ENCOUNTER — Emergency Department (HOSPITAL_COMMUNITY)
Admission: EM | Admit: 2014-02-27 | Discharge: 2014-02-27 | Disposition: A | Discharge status: Home / Self Care | Payer: Medicare Other | Attending: Emergency Medicine | Admitting: Emergency Medicine

## 2014-02-27 DIAGNOSIS — Z8739 Personal history of other diseases of the musculoskeletal system and connective tissue: Secondary | ICD-10-CM | POA: Diagnosis not present

## 2014-02-27 DIAGNOSIS — S8990XA Unspecified injury of unspecified lower leg, initial encounter: Secondary | ICD-10-CM | POA: Diagnosis present

## 2014-02-27 DIAGNOSIS — Y939 Activity, unspecified: Secondary | ICD-10-CM | POA: Insufficient documentation

## 2014-02-27 DIAGNOSIS — E1169 Type 2 diabetes mellitus with other specified complication: Secondary | ICD-10-CM | POA: Diagnosis not present

## 2014-02-27 DIAGNOSIS — E039 Hypothyroidism, unspecified: Secondary | ICD-10-CM | POA: Diagnosis not present

## 2014-02-27 DIAGNOSIS — E11621 Type 2 diabetes mellitus with foot ulcer: Secondary | ICD-10-CM

## 2014-02-27 DIAGNOSIS — F17200 Nicotine dependence, unspecified, uncomplicated: Secondary | ICD-10-CM | POA: Diagnosis not present

## 2014-02-27 DIAGNOSIS — F3289 Other specified depressive episodes: Secondary | ICD-10-CM | POA: Insufficient documentation

## 2014-02-27 DIAGNOSIS — L97509 Non-pressure chronic ulcer of other part of unspecified foot with unspecified severity: Secondary | ICD-10-CM | POA: Insufficient documentation

## 2014-02-27 DIAGNOSIS — Z8781 Personal history of (healed) traumatic fracture: Secondary | ICD-10-CM | POA: Insufficient documentation

## 2014-02-27 DIAGNOSIS — S99929A Unspecified injury of unspecified foot, initial encounter: Secondary | ICD-10-CM

## 2014-02-27 DIAGNOSIS — F329 Major depressive disorder, single episode, unspecified: Secondary | ICD-10-CM | POA: Diagnosis not present

## 2014-02-27 DIAGNOSIS — Z794 Long term (current) use of insulin: Secondary | ICD-10-CM | POA: Diagnosis not present

## 2014-02-27 DIAGNOSIS — W101XXA Fall (on)(from) sidewalk curb, initial encounter: Secondary | ICD-10-CM | POA: Insufficient documentation

## 2014-02-27 DIAGNOSIS — Z79899 Other long term (current) drug therapy: Secondary | ICD-10-CM | POA: Diagnosis not present

## 2014-02-27 DIAGNOSIS — S99919A Unspecified injury of unspecified ankle, initial encounter: Secondary | ICD-10-CM

## 2014-02-27 DIAGNOSIS — Y929 Unspecified place or not applicable: Secondary | ICD-10-CM | POA: Diagnosis not present

## 2014-02-27 DIAGNOSIS — J45909 Unspecified asthma, uncomplicated: Secondary | ICD-10-CM | POA: Insufficient documentation

## 2014-02-27 DIAGNOSIS — E785 Hyperlipidemia, unspecified: Secondary | ICD-10-CM | POA: Diagnosis not present

## 2014-02-27 DIAGNOSIS — L97529 Non-pressure chronic ulcer of other part of left foot with unspecified severity: Secondary | ICD-10-CM

## 2014-02-27 LAB — CBC WITH DIFFERENTIAL/PLATELET
BASOS ABS: 0.1 10*3/uL (ref 0.0–0.1)
BASOS PCT: 1 % (ref 0–1)
EOS ABS: 0.2 10*3/uL (ref 0.0–0.7)
Eosinophils Relative: 1 % (ref 0–5)
HCT: 49.4 % (ref 39.0–52.0)
Hemoglobin: 17.6 g/dL — ABNORMAL HIGH (ref 13.0–17.0)
Lymphocytes Relative: 18 % (ref 12–46)
Lymphs Abs: 2.6 10*3/uL (ref 0.7–4.0)
MCH: 30.4 pg (ref 26.0–34.0)
MCHC: 35.6 g/dL (ref 30.0–36.0)
MCV: 85.3 fL (ref 78.0–100.0)
MONO ABS: 1 10*3/uL (ref 0.1–1.0)
Monocytes Relative: 7 % (ref 3–12)
NEUTROS ABS: 10.5 10*3/uL — AB (ref 1.7–7.7)
Neutrophils Relative %: 73 % (ref 43–77)
Platelets: 172 10*3/uL (ref 150–400)
RBC: 5.79 MIL/uL (ref 4.22–5.81)
RDW: 14.3 % (ref 11.5–15.5)
WBC: 14.3 10*3/uL — ABNORMAL HIGH (ref 4.0–10.5)

## 2014-02-27 LAB — BASIC METABOLIC PANEL
ANION GAP: 11 (ref 5–15)
BUN: 5 mg/dL — AB (ref 6–23)
CALCIUM: 9.4 mg/dL (ref 8.4–10.5)
CHLORIDE: 99 meq/L (ref 96–112)
CO2: 28 mEq/L (ref 19–32)
CREATININE: 0.66 mg/dL (ref 0.50–1.35)
Glucose, Bld: 139 mg/dL — ABNORMAL HIGH (ref 70–99)
Potassium: 4.6 mEq/L (ref 3.7–5.3)
Sodium: 138 mEq/L (ref 137–147)

## 2014-02-27 LAB — I-STAT CHEM 8, ED
BUN: 3 mg/dL — ABNORMAL LOW (ref 6–23)
CALCIUM ION: 1.01 mmol/L — AB (ref 1.13–1.30)
CHLORIDE: 102 meq/L (ref 96–112)
Creatinine, Ser: 0.7 mg/dL (ref 0.50–1.35)
GLUCOSE: 147 mg/dL — AB (ref 70–99)
HEMATOCRIT: 53 % — AB (ref 39.0–52.0)
HEMOGLOBIN: 18 g/dL — AB (ref 13.0–17.0)
Potassium: 8.9 mEq/L (ref 3.7–5.3)
Sodium: 132 mEq/L — ABNORMAL LOW (ref 137–147)
TCO2: 29 mmol/L (ref 0–100)

## 2014-02-27 MED ORDER — CLINDAMYCIN HCL 150 MG PO CAPS: 300.0000 mg | ORAL_CAPSULE | Freq: Three times a day (TID) | ORAL | Status: DC

## 2014-02-27 MED ORDER — CLINDAMYCIN PHOSPHATE 600 MG/50ML IV SOLN
600.0000 mg | Freq: Once | INTRAVENOUS | Status: AC
  Administered 2014-02-27: 600 mg via INTRAVENOUS
  Filled 2014-02-27: qty 50

## 2014-02-27 MED ORDER — HYDROCODONE-ACETAMINOPHEN 5-325 MG PO TABS: 1.0000 | ORAL_TABLET | Freq: Four times a day (QID) | ORAL | Status: AC | PRN

## 2014-02-27 MED ORDER — OXYCODONE-ACETAMINOPHEN 5-325 MG PO TABS
1.0000 | ORAL_TABLET | Freq: Once | ORAL | Status: AC
  Administered 2014-02-27: 1 via ORAL
  Filled 2014-02-27: qty 1

## 2014-02-27 NOTE — ED Provider Notes (Signed)
6:05 PM Pt with hx of diabetic foot ulcers, presents to ED with complaint of swelling and pain to the left foot. Pt states he has had a callus to the foot for several months. States he stepped on piece of metal 4 days ago, and since then has had increased swelling around the callus. States it is very painful. Denies redness, states it is bruised. Denies fever, chills, malaise. Denies open skin or drainage. Pt has not done any treatments on this at home. Pt was screen at the fast track area, transferred for further care and IV antibiotics to the main side of ed.   Exam: pt in NAD. Non toxic appearing. There is a 1x2cm callus to the left foot over the planter surface of 1st MTP joint. There is mild surrounding swelling and small hematoma. Tender to palpation. No erythema. No skin wound otherwise. No drainage.   Given pt's hx of infected ulcers. Will get basic labs. Treat with clindamycin IV and home on antibiotics and wound care.   8:29 PM Pt's initial istat chem 8 showed potassium of 8.9. Repeat bmp is normal, suspect lab error vs hemolysis. ECG normal. Pt stable for d/c home. He is afebrile. No signs of major cellulitis or wound drainage/abscess. Clindamycin PO at home, norco for pain. Follow up with his doctor in 2-3 days. Return if worsening.   Filed Vitals:   02/27/14 1710 02/27/14 1730 02/27/14 1829 02/27/14 1946  BP:  111/90 131/82 119/82  Pulse: 84 78 79 74  Temp:      TempSrc:      Resp: 17 15 17 20   Height:      Weight:      SpO2: 93% 93% 98% 94%     Renold Genta, PA-C 02/27/14 2034

## 2014-02-27 NOTE — ED Notes (Signed)
Pt reports left foot injury occurred 4 days ago. Pt reports that he stepped off a curb in his socks stepping on a stick, causing a puncture wound. Pt reports Puncture wound area black in color.

## 2014-02-27 NOTE — ED Notes (Signed)
I Stat Chem 8 results shown to T. Kirichenko PA. Request for labs to be recollected. Informed Maryfrances Bunnell RN of same.

## 2014-02-27 NOTE — Discharge Instructions (Signed)
Clindamycin for infection until all gone. Warm water soaks. norco for severe pain. Follow up with your doctor and podiatrist next week for recheck. Return if worsening.    Skin Ulcer A skin ulcer is an open sore that can be shallow or deep. Skin ulcers sometimes become infected and are difficult to treat. It may be 1 month or longer before real healing progress is made. CAUSES   Injury.  Problems with the veins or arteries.  Diabetes.  Insect bites.  Bedsores.  Inflammatory conditions. SYMPTOMS   Pain, redness, swelling, and tenderness around the ulcer.  Fever.  Bleeding from the ulcer.  Yellow or clear fluid coming from the ulcer. DIAGNOSIS  There are many types of skin ulcers. Any open sores will be examined. Certain tests will be done to determine the kind of ulcer you have. The right treatment depends on the type of ulcer you have. TREATMENT  Treatment is a long-term challenge. It may include:  Wearing an elastic wrap, compression stockings, or gel cast over the ulcer area.  Taking antibiotic medicines or putting antibiotic creams on the affected area if there is an infection. HOME CARE INSTRUCTIONS  Put on your bandages (dressings), wraps, or casts over the ulcer as directed by your caregiver.  Change all dressings as directed by your caregiver.  Take all medicines as directed by your caregiver.  Keep the affected area clean and dry.  Avoid injuries to the affected area.  Eat a well-balanced, healthy diet that includes plenty of fruit and vegetables.  If you smoke, consider quitting or decreasing the amount of cigarettes you smoke.  Once the ulcer heals, get regular exercise as directed by your caregiver.  Work with your caregiver to make sure your blood pressure, cholesterol, and diabetes are well-controlled.  Keep your skin moisturized. Dry skin can crack and lead to skin ulcers. SEEK IMMEDIATE MEDICAL CARE IF:   Your pain gets worse.  You have  swelling, redness, or fluids around the ulcer.  You have chills.  You have a fever. MAKE SURE YOU:   Understand these instructions.  Will watch your condition.  Will get help right away if you are not doing well or get worse. Document Released: 07/26/2004 Document Revised: 09/10/2011 Document Reviewed: 02/02/2011 [facility] Patient Information 2015 [place], [place]. This information is not intended to replace advice given to you by your health care provider. Make sure you discuss any questions you have with your health care provider.

## 2014-02-27 NOTE — ED Notes (Signed)
Pt discharged home with all belongings, pt alert, oriented and ambulatory upon discharge, pt escorted to waiting area via wheel chair by Magda Paganini RN, 2 new RX prescribed, pt verbalizes understanding of discharge instructions, pt driven home by sister

## 2014-02-27 NOTE — ED Provider Notes (Signed)
  Chief Complaint   Chief Complaint  Patient presents with  . Foot Injury    History of Present Illness   Derek Calderon is a 12 who has been here twice, once on August 9 and once August 15 for a diabetic foot ulcer. X-rays were obtained. He was treated with antibiotics, but this has not gotten any better. 5 days ago he was out in his yard and he stepped on a piece of metal. Ever since then it's been more painful, swollen, and erythematous. Has not been draining any pus. He denies any fever or chills.  Review of Systems   Other than as noted above, the patient denies any of the following symptoms: Systemic:  No fevers or chills. Musculoskeletal:  No joint pain or arthritis.  Neurological:  No muscular weakness, paresthesias.   [place]   Past medical history, family history, social history, meds, and allergies were reviewed.   He has diabetes and high blood pressure. Current meds include albuterol, Lipitor, gabapentin, Novolin N., Synthroid, metformin, Paxil, Depakote testosterone, and Diovan. He's allergic to Neosporin.  Physical  Examination     Vital signs:  BP 118/82  Pulse 102  Temp(Src) 98.3 F (36.8 C) (Oral)  Resp 18  Ht 6\' 2"  (1.88 m)  Wt 245 lb (111.131 kg)  BMI 31.44 kg/m2  SpO2 96% Gen:  Alert and oriented times 3.  In no distress. Musculoskeletal:  Exam of the foot reveals there is a large ulcer overlying the MTP joint of the left great toe with surrounding erythema, swelling, and tenderness to palpation.  Otherwise, all joints had a full a ROM with no swelling, bruising or deformity.  No edema, pulses full. Extremities were warm and pink.  Capillary refill was brisk.  Skin:  Clear, warm and dry.  No rash. Neuro:  Alert and oriented times 3.  Muscle strength was normal.  Sensation was intact to light touch.   Assessment   The primary encounter diagnosis was Diabetic ulcer of left foot associated with type 2 diabetes mellitus. A diagnosis of Cellulitis of left  lower extremity was also pertinent to this visit.  He will need evaluation at a higher acuity level in the emergency department, possibly with IV antibiotics.  Plan    Transfer to higher acuity level for further evaluation.     Harden Mo, MD 02/27/14 [phone]

## 2014-03-02 NOTE — ED Provider Notes (Signed)
Medical screening examination/treatment/procedure(s) were performed by non-physician practitioner and as supervising physician I was immediately available for consultation/collaboration.   EKG Interpretation   Date/Time:  Saturday February 27 2014 18:51:28 EDT Ventricular Rate:  71 PR Interval:  62 QRS Duration: 91 QT Interval:  363 QTC Calculation: 394 R Axis:   70 Text Interpretation:  Sinus rhythm Ventricular premature complex Short PR  interval Abnormal R-wave progression, early transition Minimal ST  elevation, inferior leads ED PHYSICIAN INTERPRETATION AVAILABLE IN [facility] Confirmed by TEST, Record ([id number]) on 03/01/2014 7:50:59 AM       Virgel Manifold, MD 03/02/14 [phone]

## 2014-03-17 ENCOUNTER — Other Ambulatory Visit: Payer: Self-pay | Admitting: *Deleted

## 2014-03-17 ENCOUNTER — Telehealth: Payer: Self-pay | Admitting: Endocrinology

## 2014-03-17 MED ORDER — PREGABALIN 50 MG PO CAPS: 50.0000 mg | ORAL_CAPSULE | Freq: Two times a day (BID) | ORAL | Status: AC

## 2014-03-17 NOTE — Telephone Encounter (Signed)
Patient would like a refill on his Lyrica   Please advise patient   Thank you

## 2014-03-17 NOTE — Telephone Encounter (Signed)
Please see below and advise.

## 2014-03-17 NOTE — Telephone Encounter (Signed)
ok 

## 2014-03-18 NOTE — Telephone Encounter (Signed)
rx faxed

## 2014-03-25 ENCOUNTER — Telehealth: Payer: Self-pay | Admitting: Endocrinology

## 2014-03-25 NOTE — Telephone Encounter (Signed)
FYI [employer] [facility] [employer] will be calling and sending over a form for diabetic shoes

## 2014-03-25 NOTE — Telephone Encounter (Signed)
Please read note below

## 2014-03-29 ENCOUNTER — Emergency Department (HOSPITAL_COMMUNITY)
Admission: EM | Admit: 2014-03-29 | Discharge: 2014-03-30 | Disposition: A | Discharge status: Home / Self Care | Payer: Medicare Other | Attending: Emergency Medicine | Admitting: Emergency Medicine

## 2014-03-29 ENCOUNTER — Encounter (HOSPITAL_COMMUNITY): Payer: Self-pay | Admitting: Emergency Medicine

## 2014-03-29 DIAGNOSIS — I10 Essential (primary) hypertension: Secondary | ICD-10-CM | POA: Insufficient documentation

## 2014-03-29 DIAGNOSIS — L84 Corns and callosities: Secondary | ICD-10-CM | POA: Insufficient documentation

## 2014-03-29 DIAGNOSIS — Z79899 Other long term (current) drug therapy: Secondary | ICD-10-CM | POA: Insufficient documentation

## 2014-03-29 DIAGNOSIS — E039 Hypothyroidism, unspecified: Secondary | ICD-10-CM | POA: Insufficient documentation

## 2014-03-29 DIAGNOSIS — Z4800 Encounter for change or removal of nonsurgical wound dressing: Secondary | ICD-10-CM | POA: Insufficient documentation

## 2014-03-29 DIAGNOSIS — Z8781 Personal history of (healed) traumatic fracture: Secondary | ICD-10-CM | POA: Diagnosis not present

## 2014-03-29 DIAGNOSIS — F17200 Nicotine dependence, unspecified, uncomplicated: Secondary | ICD-10-CM | POA: Diagnosis not present

## 2014-03-29 DIAGNOSIS — Z794 Long term (current) use of insulin: Secondary | ICD-10-CM | POA: Insufficient documentation

## 2014-03-29 DIAGNOSIS — F3289 Other specified depressive episodes: Secondary | ICD-10-CM | POA: Diagnosis not present

## 2014-03-29 DIAGNOSIS — J45909 Unspecified asthma, uncomplicated: Secondary | ICD-10-CM | POA: Insufficient documentation

## 2014-03-29 DIAGNOSIS — M129 Arthropathy, unspecified: Secondary | ICD-10-CM | POA: Diagnosis not present

## 2014-03-29 DIAGNOSIS — E785 Hyperlipidemia, unspecified: Secondary | ICD-10-CM | POA: Insufficient documentation

## 2014-03-29 DIAGNOSIS — Z862 Personal history of diseases of the blood and blood-forming organs and certain disorders involving the immune mechanism: Secondary | ICD-10-CM | POA: Diagnosis not present

## 2014-03-29 DIAGNOSIS — F329 Major depressive disorder, single episode, unspecified: Secondary | ICD-10-CM | POA: Insufficient documentation

## 2014-03-29 LAB — CBG MONITORING, ED: Glucose-Capillary: 131 mg/dL — ABNORMAL HIGH (ref 70–99)

## 2014-03-29 NOTE — ED Notes (Signed)
Pt reports a piece of callus on his L foot got caught on the floor at home, pulling a layer of skin off.  Pt is a diabetic.  Pt reports L foot pain.

## 2014-03-30 NOTE — ED Notes (Signed)
Patient is alert and oriented x3.  He was given DC instructions and follow up visit instructions.  Patient gave verbal understanding.  He was DC ambulatory under his own power to home.  V/S stable.  He was not showing any signs of distress on DC 

## 2014-03-31 NOTE — ED Provider Notes (Signed)
CSN: [id number]     Arrival date & time 03/29/14  2006 History   First MD Initiated Contact with Patient 03/29/14 2330     Chief Complaint  Patient presents with  . Wound Check      HPI Patient has had a callus on his left foot for some time he reports that he caught his left foot on the floor and a small piece of a callus for cough.  This has resulted in pain.  He has no fevers or chills.  He was in his normal state of health prior to this injury.  He is a rotatory since the event.  His pain is mild in severity.  Nothing worsens or improves his pain.  Patient is a diabetic.   Past Medical History  Diagnosis Date  . Lymphocytosis   . Mediastinal lymphadenopathy   . Diabetes mellitus   . Hyperlipidemia   . HTN (hypertension)   . Asthma   . Depression   . Fracture of fibula, distal, right, closed 02/01/2012  . Hypothyroidism   . Arthritis    Past Surgical History  Procedure Laterality Date  . Carpal tunnel release  08-2009    right  . Tonsillectomy    . Eye surgery  2005    both cataracts  . Right leg surgery       plate at ankle   . Shoulder open rotator cuff repair Right 01/08/2013    Procedure: ROTATOR CUFF REPAIR SHOULDER OPEN;  Surgeon: Tobi Bastos, MD;  Location: [facility];  Service: Orthopedics;  Laterality: Right;  With Patch Graft   Family History  Problem Relation Age of Onset  . Allergies Sister   . Cancer Mother     stomach  . Pancreatic cancer Father    History  Substance Use Topics  . Smoking status: Current Every Day Smoker -- 0.50 packs/day for 6 years    Types: Cigarettes  . Smokeless tobacco: Never Used  . Alcohol Use: No     Comment: quit in 2005    Review of Systems  All other systems reviewed and are negative.     Allergies  Eszopiclone and Neosporin  Home Medications   Prior to Admission medications   Medication Sig Start Date End Date Taking? Authorizing Provider  albuterol (PROVENTIL HFA;VENTOLIN HFA) 108 (90 BASE) MCG/ACT inhaler  Inhale 2 puffs into the lungs every 6 (six) hours as needed for wheezing or shortness of breath.     Historical Provider, MD  atorvastatin (LIPITOR) 10 MG tablet Take 10 mg by mouth every morning.     Historical Provider, MD  clindamycin (CLEOCIN) 150 MG capsule Take 2 capsules (300 mg total) by mouth 3 (three) times daily. 02/27/14   Tatyana A Kirichenko, PA-C  gabapentin (NEURONTIN) 100 MG capsule Take 1 capsule (100 mg total) by mouth 3 (three) times daily. 02/19/14   Elayne Snare, MD  HYDROcodone-acetaminophen (NORCO) 5-325 MG per tablet Take 1 tablet by mouth every 6 (six) hours as needed for moderate pain. 02/27/14   Tatyana A Kirichenko, PA-C  insulin NPH Human (HUMULIN N,NOVOLIN N) 100 UNIT/ML injection Inject 25-40 Units into the skin 2 (two) times daily before a meal. Sliding scale    Historical Provider, MD  levothyroxine (SYNTHROID, LEVOTHROID) 50 MCG tablet Take 50 mcg by mouth daily before breakfast.    Historical Provider, MD  metFORMIN (GLUCOPHAGE) 1000 MG tablet Take 1,000 mg by mouth 2 (two) times daily with a meal.    Historical Provider,  MD  PARoxetine (PAXIL) 40 MG tablet Take 40 mg by mouth daily.     Historical Provider, MD  pregabalin (LYRICA) 50 MG capsule Take 1 capsule (50 mg total) by mouth 2 (two) times daily. 03/17/14   Elayne Snare, MD  testosterone cypionate (DEPOTESTOTERONE CYPIONATE) 200 MG/ML injection Inject 300 mg into the muscle every 14 (fourteen) days.    Historical Provider, MD  valsartan (DIOVAN) 80 MG tablet Take 80 mg by mouth daily.    Historical Provider, MD   BP 135/75  Pulse 98  Temp(Src) 98.6 F (37 C) (Oral)  Resp 20  SpO2 98% Physical Exam  Nursing note and vitals reviewed. Constitutional: He is oriented to person, place, and time. He appears well-developed and well-nourished.  HENT:  Head: Normocephalic.  Eyes: EOM are normal.  Neck: Normal range of motion.  Pulmonary/Chest: Effort normal.  Abdominal: He exhibits no distension.  Musculoskeletal:  Normal range of motion.  Large chronic-appearing callus over the distal first and second metatarsals on the plantar surface of left foot.  No secondary signs of infection, no drainage, no redness or erythema, no significant tenderness.  Callus is present with obvious recent loss of some of this callus  Neurological: He is alert and oriented to person, place, and time.  Psychiatric: He has a normal mood and affect.    ED Course  Procedures (including critical care time) Labs Review Labs Reviewed  CBG MONITORING, ED - Abnormal; Notable for the following:    Glucose-Capillary 131 (*)    All other components within normal limits    Imaging Review No results found.   EKG Interpretation None      MDM   Final diagnoses:  Callus of foot    The callus edges were cleaned at the bedside with scissors.  No signs of infection.  Recommend followup with a podiatrist.    Hoy Morn, MD 03/31/14 [phone]

## 2014-04-01 ENCOUNTER — Other Ambulatory Visit: Payer: Medicare Other

## 2014-04-02 ENCOUNTER — Other Ambulatory Visit (INDEPENDENT_AMBULATORY_CARE_PROVIDER_SITE_OTHER): Payer: Medicare Other

## 2014-04-02 DIAGNOSIS — E1165 Type 2 diabetes mellitus with hyperglycemia: Secondary | ICD-10-CM

## 2014-04-02 DIAGNOSIS — IMO0002 Reserved for concepts with insufficient information to code with codable children: Secondary | ICD-10-CM

## 2014-04-02 DIAGNOSIS — R61 Generalized hyperhidrosis: Secondary | ICD-10-CM

## 2014-04-02 DIAGNOSIS — E119 Type 2 diabetes mellitus without complications: Secondary | ICD-10-CM

## 2014-04-02 LAB — LIPID PANEL
CHOLESTEROL: 154 mg/dL (ref 0–200)
HDL: 24.8 mg/dL — AB (ref >39.00)
LDL Cholesterol: 103 mg/dL — ABNORMAL HIGH (ref 0–99)
NONHDL: 129.2
Total CHOL/HDL Ratio: 6
Triglycerides: 131 mg/dL (ref 0.0–149.0)
VLDL: 26.2 mg/dL (ref 0.0–40.0)

## 2014-04-02 LAB — COMPREHENSIVE METABOLIC PANEL
ALT: 14 U/L (ref 0–53)
AST: 17 U/L (ref 0–37)
Albumin: 3.6 g/dL (ref 3.5–5.2)
Alkaline Phosphatase: 65 U/L (ref 39–117)
BILIRUBIN TOTAL: 0.4 mg/dL (ref 0.2–1.2)
BUN: 15 mg/dL (ref 6–23)
CO2: 25 mEq/L (ref 19–32)
Calcium: 8.7 mg/dL (ref 8.4–10.5)
Chloride: 101 mEq/L (ref 96–112)
Creatinine, Ser: 0.7 mg/dL (ref 0.4–1.5)
GFR: 121.91 mL/min (ref >60.00)
GLUCOSE: 261 mg/dL — AB (ref 70–99)
Potassium: 4.1 mEq/L (ref 3.5–5.1)
SODIUM: 133 meq/L — AB (ref 135–145)
TOTAL PROTEIN: 7 g/dL (ref 6.0–8.3)

## 2014-04-02 LAB — HEMOGLOBIN A1C: Hgb A1c MFr Bld: 9.1 % — ABNORMAL HIGH (ref 4.6–6.5)

## 2014-04-02 LAB — LDL CHOLESTEROL, DIRECT: Direct LDL: 85.6 mg/dL

## 2014-04-02 LAB — TSH: TSH: 0.51 u[IU]/mL (ref 0.35–4.50)

## 2014-04-12 ENCOUNTER — Ambulatory Visit (INDEPENDENT_AMBULATORY_CARE_PROVIDER_SITE_OTHER): Payer: Medicare Other | Admitting: Endocrinology

## 2014-04-12 ENCOUNTER — Encounter: Payer: Self-pay | Admitting: Endocrinology

## 2014-04-12 ENCOUNTER — Other Ambulatory Visit: Payer: Self-pay | Admitting: *Deleted

## 2014-04-12 VITALS — BP 120/74 | HR 77 | Temp 98.6°F | Resp 16 | Ht 70.0 in | Wt 240.8 lb

## 2014-04-12 DIAGNOSIS — E1142 Type 2 diabetes mellitus with diabetic polyneuropathy: Secondary | ICD-10-CM

## 2014-04-12 DIAGNOSIS — E1165 Type 2 diabetes mellitus with hyperglycemia: Secondary | ICD-10-CM

## 2014-04-12 DIAGNOSIS — E785 Hyperlipidemia, unspecified: Secondary | ICD-10-CM

## 2014-04-12 DIAGNOSIS — IMO0002 Reserved for concepts with insufficient information to code with codable children: Secondary | ICD-10-CM

## 2014-04-12 DIAGNOSIS — I10 Essential (primary) hypertension: Secondary | ICD-10-CM

## 2014-04-12 DIAGNOSIS — Z23 Encounter for immunization: Secondary | ICD-10-CM

## 2014-04-12 MED ORDER — TESTOSTERONE CYPIONATE 200 MG/ML IM SOLN: 300.0000 mg | INTRAMUSCULAR | Status: AC

## 2014-04-12 NOTE — Patient Instructions (Addendum)
45 Units of N insulin at bedtime and keep am sugars in 90-130 range  Please check blood sugars at least half the time about 2 hours after any meal and 3 times per week on waking up. Please bring blood sugar monitor to each visit  Get  Shoes from [employer]
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
## 2014-04-20 NOTE — Progress Notes (Signed)
This encounter was created in error - please disregard.

## 2014-04-22 NOTE — Telephone Encounter (Signed)
error 

## 2014-04-27 ENCOUNTER — Ambulatory Visit (INDEPENDENT_AMBULATORY_CARE_PROVIDER_SITE_OTHER): Payer: Medicare Other | Admitting: *Deleted

## 2014-04-27 DIAGNOSIS — E230 Hypopituitarism: Secondary | ICD-10-CM

## 2014-04-27 MED ORDER — TESTOSTERONE CYPIONATE 200 MG/ML IM SOLN
300.0000 mg | Freq: Once | INTRAMUSCULAR | Status: AC
  Administered 2014-04-27: 300 mg via INTRAMUSCULAR

## 2014-06-01 DEATH — deceased

## 2014-06-03 ENCOUNTER — Other Ambulatory Visit: Payer: Medicare Other

## 2014-06-11 ENCOUNTER — Ambulatory Visit: Payer: Medicare Other | Admitting: Endocrinology

## 2014-07-18 NOTE — Progress Notes (Signed)
This encounter was created in error - please disregard.

## 2015-10-11 ENCOUNTER — Encounter: Payer: Self-pay | Admitting: Physical Medicine & Rehabilitation
